# Patient Record
Sex: Female | Born: 1955 | Race: White | Hispanic: No | Marital: Married | Smoking: Never smoker
Health system: Southern US, Community
[De-identification: ages and names within clinical notes are randomized; demographics above are authoritative.]

## PROBLEM LIST (undated history)

## (undated) DIAGNOSIS — D369 Benign neoplasm, unspecified site: Secondary | ICD-10-CM

## (undated) DIAGNOSIS — M5090 Cervical disc disorder, unspecified, unspecified cervical region: Secondary | ICD-10-CM

## (undated) DIAGNOSIS — M1990 Unspecified osteoarthritis, unspecified site: Secondary | ICD-10-CM

## (undated) DIAGNOSIS — Z960 Presence of urogenital implants: Secondary | ICD-10-CM

## (undated) DIAGNOSIS — Z780 Asymptomatic menopausal state: Secondary | ICD-10-CM

## (undated) DIAGNOSIS — G709 Myoneural disorder, unspecified: Secondary | ICD-10-CM

## (undated) DIAGNOSIS — T148XXA Other injury of unspecified body region, initial encounter: Secondary | ICD-10-CM

## (undated) DIAGNOSIS — C801 Malignant (primary) neoplasm, unspecified: Secondary | ICD-10-CM

## (undated) DIAGNOSIS — M4802 Spinal stenosis, cervical region: Secondary | ICD-10-CM

## (undated) DIAGNOSIS — R591 Generalized enlarged lymph nodes: Secondary | ICD-10-CM

## (undated) HISTORY — DX: Cervical disc disorder, unspecified, unspecified cervical region: M50.90

## (undated) HISTORY — PX: ABDOMINAL SURGERY: SHX537

## (undated) HISTORY — DX: Malignant (primary) neoplasm, unspecified: C80.1

## (undated) HISTORY — DX: Spinal stenosis, cervical region: M48.02

## (undated) HISTORY — PX: ILEOSTOMY: SHX1783

## (undated) HISTORY — DX: Asymptomatic menopausal state: Z78.0

## (undated) HISTORY — PX: OTHER SURGICAL HISTORY: SHX169

## (undated) HISTORY — DX: Generalized enlarged lymph nodes: R59.1

## (undated) HISTORY — PX: BREAST BIOPSY: SHX20

## (undated) HISTORY — DX: Benign neoplasm, unspecified site: D36.9

## (undated) HISTORY — PX: LYMPH GLAND EXCISION: SHX13

## (undated) SURGERY — INSERTION, TUNNELED CENTRAL VENOUS DEVICE, WITH PORT
Anesthesia: General | Laterality: Left

---

## 1984-11-27 HISTORY — PX: EXPLORATORY LAPAROTOMY: SUR591

## 1984-11-27 HISTORY — PX: APPENDECTOMY: SHX54

## 1984-11-27 HISTORY — PX: MASS EXCISION: SHX2000

## 2007-03-07 ENCOUNTER — Ambulatory Visit: Payer: Self-pay | Admitting: Unknown Physician Specialty

## 2007-04-29 ENCOUNTER — Ambulatory Visit: Payer: Self-pay | Admitting: Unknown Physician Specialty

## 2007-04-29 ENCOUNTER — Other Ambulatory Visit: Payer: Self-pay

## 2007-05-02 ENCOUNTER — Ambulatory Visit: Payer: Self-pay | Admitting: Unknown Physician Specialty

## 2007-12-05 ENCOUNTER — Ambulatory Visit: Payer: Self-pay | Admitting: Internal Medicine

## 2007-12-10 ENCOUNTER — Ambulatory Visit: Payer: Self-pay | Admitting: Cardiology

## 2007-12-18 ENCOUNTER — Ambulatory Visit: Payer: Self-pay | Admitting: Internal Medicine

## 2007-12-25 ENCOUNTER — Ambulatory Visit: Payer: Self-pay | Admitting: Internal Medicine

## 2007-12-29 ENCOUNTER — Ambulatory Visit: Payer: Self-pay | Admitting: Oncology

## 2008-01-02 ENCOUNTER — Ambulatory Visit: Payer: Self-pay | Admitting: Internal Medicine

## 2008-01-03 ENCOUNTER — Ambulatory Visit: Payer: Self-pay | Admitting: Oncology

## 2008-01-09 ENCOUNTER — Ambulatory Visit: Payer: Self-pay | Admitting: Oncology

## 2008-01-26 ENCOUNTER — Ambulatory Visit: Payer: Self-pay | Admitting: Oncology

## 2008-02-20 ENCOUNTER — Ambulatory Visit: Payer: Self-pay | Admitting: Oncology

## 2008-02-26 ENCOUNTER — Ambulatory Visit: Payer: Self-pay | Admitting: Oncology

## 2008-02-27 ENCOUNTER — Ambulatory Visit: Payer: Self-pay | Admitting: Unknown Physician Specialty

## 2008-03-03 ENCOUNTER — Ambulatory Visit: Payer: Self-pay | Admitting: Unknown Physician Specialty

## 2008-03-04 ENCOUNTER — Ambulatory Visit: Payer: Self-pay | Admitting: Oncology

## 2008-03-10 ENCOUNTER — Ambulatory Visit: Payer: Self-pay | Admitting: Unknown Physician Specialty

## 2008-03-18 ENCOUNTER — Ambulatory Visit: Payer: Self-pay | Admitting: Unknown Physician Specialty

## 2008-03-25 ENCOUNTER — Ambulatory Visit: Payer: Self-pay | Admitting: Surgery

## 2008-03-27 HISTORY — PX: THORACOTOMY: SUR1349

## 2008-04-01 ENCOUNTER — Inpatient Hospital Stay: Payer: Self-pay | Admitting: Surgery

## 2008-04-14 ENCOUNTER — Ambulatory Visit: Payer: Self-pay | Admitting: Surgery

## 2008-05-11 ENCOUNTER — Ambulatory Visit: Payer: Self-pay | Admitting: Surgery

## 2009-11-27 DIAGNOSIS — Z780 Asymptomatic menopausal state: Secondary | ICD-10-CM

## 2009-11-27 HISTORY — DX: Asymptomatic menopausal state: Z78.0

## 2010-06-09 ENCOUNTER — Ambulatory Visit: Payer: Self-pay | Admitting: Internal Medicine

## 2010-10-18 ENCOUNTER — Encounter: Payer: Self-pay | Admitting: Family Medicine

## 2010-10-27 ENCOUNTER — Encounter: Payer: Self-pay | Admitting: Family Medicine

## 2010-10-31 ENCOUNTER — Ambulatory Visit: Payer: Self-pay | Admitting: Internal Medicine

## 2010-11-09 ENCOUNTER — Ambulatory Visit: Payer: Self-pay | Admitting: Pain Medicine

## 2010-11-27 ENCOUNTER — Encounter: Payer: Self-pay | Admitting: Family Medicine

## 2012-03-14 ENCOUNTER — Ambulatory Visit: Payer: Self-pay | Admitting: Obstetrics and Gynecology

## 2012-03-14 LAB — URINALYSIS, COMPLETE
Glucose,UR: NEGATIVE mg/dL (ref 0–75)
Ketone: NEGATIVE
Ph: 6 (ref 4.5–8.0)
RBC,UR: NONE SEEN /HPF (ref 0–5)
WBC UR: 1 /HPF (ref 0–5)

## 2012-03-14 LAB — CBC
HGB: 13.7 g/dL (ref 12.0–16.0)
MCH: 30.9 pg (ref 26.0–34.0)
MCHC: 34.1 g/dL (ref 32.0–36.0)
MCV: 91 fL (ref 80–100)
Platelet: 150 10*3/uL (ref 150–440)
RBC: 4.43 10*6/uL (ref 3.80–5.20)
RDW: 12.7 % (ref 11.5–14.5)

## 2012-03-14 LAB — PREGNANCY, URINE: Pregnancy Test, Urine: NEGATIVE m[IU]/mL

## 2012-03-21 ENCOUNTER — Ambulatory Visit: Payer: Self-pay | Admitting: Obstetrics and Gynecology

## 2013-07-16 ENCOUNTER — Ambulatory Visit: Payer: Self-pay | Admitting: Cardiology

## 2015-03-21 NOTE — Op Note (Signed)
PATIENT NAME:  Sylvia Jones, Sylvia Jones MR#:  [id number] DATE OF BIRTH:  09/27/1956  DATE OF PROCEDURE:  03/21/2012  PREOPERATIVE DIAGNOSES:   1. Postmenopausal bleeding. 2. AGUS Pap.   POSTOPERATIVE DIAGNOSES:  1. Postmenopausal bleeding. 2. AGUS Pap.   PROCEDURES: Hysteroscopy, dilation and curettage.   SURGEON: Omarri Eich A. Ferne Reus, MD  ANESTHESIA: General.   ESTIMATED BLOOD LOSS: 50 mL.  OPERATIVE FLUIDS: 600 mL.   URINE OUTPUT: 100 mL.   COMPLICATIONS: None.   FINDINGS: Normal appearing endometrium.   SPECIMEN: Endometrial curettings.   INDICATIONS: Patient is a 59 year old who presents with postmenopausal bleeding. She had a slightly thickened endometrium on ultrasound in office. Attempted endometrial biopsy was not successful secondary to patient's stenotic cervix therefore decision was made to proceed towards surgical evaluation and more extensive uterine sampling. Risks, benefits, and alternatives of the procedure were explained and informed consent was obtained.   DESCRIPTION OF PROCEDURE: Patient was taken to the Operating Room with IV fluids running. She was prepped and draped in the usual sterile fashion in [place]. Speculum was placed inside of the patient's vagina. The anterior lip of the cervix was grasped with single-tooth tenaculum. The uterus sounded to 11 cm and the cervix was dilated using Pratt dilators. The hysteroscope was placed. Findings were as previously stated. The scope was removed and the endometrium was curetted using a serrated edge banjo curette. Specimen was removed and handed off. The scope was then replaced. There were no findings of polyps, submucosal fibroids or other worrisome findings at the time of hysteroscopy. All instrumentation was removed from the patient's vagina. Sponge, instrument counts were correct x2. Tenaculum site on the cervix was made hemostatic using AstrinGyn. Patient was awakened from anesthesia and taken to recovery room in  stable condition.   ____________________________ Rolm Gala Ferne Reus, MD law:cms D: 03/21/2012 12:59:36 ET T: 03/21/2012 13:44:25 ET JOB#: [id number]  cc: Sherlynn Carbon A. Ferne Reus, MD, <Dictator> Rolm Gala WEAVER LEE MD ELECTRONICALLY SIGNED 03/25/2012 9:06

## 2015-07-12 ENCOUNTER — Encounter
Admission: RE | Disposition: A | Discharge status: Home / Self Care | Payer: Self-pay | Source: Ambulatory Visit | Attending: Unknown Physician Specialty

## 2015-07-12 ENCOUNTER — Ambulatory Visit: Payer: 59 | Admitting: Anesthesiology

## 2015-07-12 ENCOUNTER — Encounter: Payer: Self-pay | Admitting: *Deleted

## 2015-07-12 ENCOUNTER — Ambulatory Visit
Admission: RE | Admit: 2015-07-12 | Discharge: 2015-07-12 | Disposition: A | Discharge status: Home / Self Care | Payer: 59 | Source: Ambulatory Visit | Attending: Unknown Physician Specialty | Admitting: Unknown Physician Specialty

## 2015-07-12 DIAGNOSIS — Z1211 Encounter for screening for malignant neoplasm of colon: Secondary | ICD-10-CM | POA: Diagnosis present

## 2015-07-12 DIAGNOSIS — K640 First degree hemorrhoids: Secondary | ICD-10-CM | POA: Insufficient documentation

## 2015-07-12 DIAGNOSIS — D123 Benign neoplasm of transverse colon: Secondary | ICD-10-CM | POA: Diagnosis not present

## 2015-07-12 DIAGNOSIS — Z800 Family history of malignant neoplasm of digestive organs: Secondary | ICD-10-CM | POA: Insufficient documentation

## 2015-07-12 HISTORY — PX: COLONOSCOPY WITH PROPOFOL: SHX5780

## 2015-07-12 SURGERY — COLONOSCOPY WITH PROPOFOL
Anesthesia: General

## 2015-07-12 MED ORDER — PROPOFOL INFUSION 10 MG/ML OPTIME
INTRAVENOUS | Status: DC | PRN
  Administered 2015-07-12: 100 ug/kg/min via INTRAVENOUS

## 2015-07-12 MED ORDER — FENTANYL CITRATE (PF) 100 MCG/2ML IJ SOLN
INTRAMUSCULAR | Status: DC | PRN
  Administered 2015-07-12 (×2): 25 ug via INTRAVENOUS
  Administered 2015-07-12: 50 ug via INTRAVENOUS

## 2015-07-12 MED ORDER — PROPOFOL 10 MG/ML IV BOLUS
INTRAVENOUS | Status: DC | PRN
  Administered 2015-07-12: 80 mg via INTRAVENOUS
  Administered 2015-07-12: 20 mg via INTRAVENOUS

## 2015-07-12 MED ORDER — SODIUM CHLORIDE 0.9 % IV SOLN: INTRAVENOUS | Status: DC

## 2015-07-12 MED ORDER — GLYCOPYRROLATE 0.2 MG/ML IJ SOLN
INTRAMUSCULAR | Status: DC | PRN
  Administered 2015-07-12: 0.2 mg via INTRAVENOUS

## 2015-07-12 MED ORDER — MIDAZOLAM HCL 2 MG/2ML IJ SOLN
INTRAMUSCULAR | Status: DC | PRN
  Administered 2015-07-12: 2 mg via INTRAVENOUS

## 2015-07-12 MED ORDER — SODIUM CHLORIDE 0.9 % IV SOLN
INTRAVENOUS | Status: DC
  Administered 2015-07-12: 13:00:00 via INTRAVENOUS

## 2015-07-12 MED ORDER — PHENYLEPHRINE HCL 10 MG/ML IJ SOLN
INTRAMUSCULAR | Status: DC | PRN
  Administered 2015-07-12 (×2): 100 ug via INTRAVENOUS
  Administered 2015-07-12: 50 ug via INTRAVENOUS
  Administered 2015-07-12: 100 ug via INTRAVENOUS

## 2015-07-12 NOTE — Op Note (Signed)
[facility] Gastroenterology Patient Name: Sylvia Jones Procedure Date: 07/12/2015 11:43 AM MRN: [id number] Account #: [id number] Date of Birth: 1956-05-08 Admit Type: Outpatient Age: 59 Room: [facility] ENDO ROOM 1 Gender: Female Note Status: Finalized Procedure:         Colonoscopy Indications:       Screening in patient at increased risk: Family history of                     1st-degree relative with colorectal cancer Providers:         Manya Silvas, MD Referring MD:      Rusty Aus, MD (Referring MD) Medicines:         Propofol per Anesthesia Complications:     No immediate complications. Procedure:         Pre-Anesthesia Assessment:                    - After reviewing the risks and benefits, the patient was                     deemed in satisfactory condition to undergo the procedure.                    After obtaining informed consent, the colonoscope was                     passed under direct vision. Throughout the procedure, the                     patient's blood pressure, pulse, and oxygen saturations                     were monitored continuously. The Colonoscope was                     introduced through the anus and advanced to the the cecum,                     identified by appendiceal orifice and ileocecal valve. The                     colonoscopy was performed with difficulty due to                     significant looping and a tortuous colon. Successful                     completion of the procedure was aided by applying                     abdominal pressure. The patient tolerated the procedure                     well. The quality of the bowel preparation was good. Findings:      The colon was long and redundant      A 10 mm polyp was found in the distal transverse colon. The polyp was       sessile. The polyp was removed with a hot snare. Resection and retrieval       were complete. To prevent bleeding after the polypectomy, one  hemostatic       clip was successfully placed. There was no bleeding during, and at the       end, of the procedure.  Internal hemorrhoids were found during endoscopy. The hemorrhoids were       small, medium-sized and Grade I (internal hemorrhoids that do not       prolapse).      The exam was otherwise without abnormality. Impression:        - One 10 mm polyp in the distal transverse colon. Resected                     and retrieved. Clip was placed.                    - Internal hemorrhoids.                    - The examination was otherwise normal. Recommendation:    - Await pathology results. Manya Silvas, MD 07/12/2015 2:14:23 PM This report has been signed electronically. Number of Addenda: 0 Note Initiated On: 07/12/2015 11:43 AM Scope Withdrawal Time: 0 hours 35 minutes 52 seconds  Total Procedure Duration: 0 hours 44 minutes 4 seconds       [facility]

## 2015-07-12 NOTE — Transfer of Care (Signed)
Immediate Anesthesia Transfer of Care Note  Patient: Sylvia Jones  Procedure(s) Performed: Procedure(s): COLONOSCOPY WITH PROPOFOL (N/A)  Patient Location: PACU  Anesthesia Type:General  Level of Consciousness: sedated  Airway & Oxygen Therapy: Patient Spontanous Breathing and Patient connected to nasal cannula oxygen  Post-op Assessment: Report given to RN and Post -op Vital signs reviewed and stable  Post vital signs: Reviewed and stable  Last Vitals:  Filed Vitals:   07/12/15 1243  BP: 147/64  Pulse: 66  Temp: 37.3 C  Resp: 16    Complications: No apparent anesthesia complications

## 2015-07-12 NOTE — Anesthesia Preprocedure Evaluation (Signed)
Anesthesia Evaluation  Patient identified by MRN, date of birth, ID band Patient awake    Reviewed: Allergy & Precautions, NPO status , Patient's Chart, lab work & pertinent test results  History of Anesthesia Complications (+) history of anesthetic complications  Airway Mallampati: I       Dental no notable dental hx.    Pulmonary neg pulmonary ROS,    Pulmonary exam normal       Cardiovascular negative cardio ROS Normal cardiovascular exam    Neuro/Psych    GI/Hepatic negative GI ROS, Neg liver ROS,   Endo/Other  negative endocrine ROS  Renal/GU negative Renal ROS     Musculoskeletal negative musculoskeletal ROS (+)   Abdominal   Peds negative pediatric ROS (+)  Hematology negative hematology ROS (+)   Anesthesia Other Findings   Reproductive/Obstetrics negative OB ROS                             Anesthesia Physical Anesthesia Plan  ASA: I  Anesthesia Plan: General   Post-op Pain Management:    Induction: Intravenous  Airway Management Planned: Nasal Cannula  Additional Equipment:   Intra-op Plan:   Post-operative Plan:   Informed Consent: I have reviewed the patients History and Physical, chart, labs and discussed the procedure including the risks, benefits and alternatives for the proposed anesthesia with the patient or authorized representative who has indicated his/her understanding and acceptance.     Plan Discussed with: CRNA  Anesthesia Plan Comments:         Anesthesia Quick Evaluation

## 2015-07-12 NOTE — H&P (Signed)
   Primary Care Physician:  Rusty Aus., MD Primary Gastroenterologist:  Dr. Vira Agar  Pre-Procedure History & Physical: HPI:  Sylvia Jones is a 59 y.o. female is here for an colonoscopy.   No past medical history on file.  Past Surgical History  Procedure Laterality Date  . Thoracotomy Right     small nodule noted    Prior to Admission medications   Medication Sig Start Date End Date Taking? Authorizing Provider  Multiple Vitamin (MULTIVITAMIN) tablet Take 1 tablet by mouth daily.   Yes Historical Provider, MD  alendronate-cholecalciferol (FOSAMAX PLUS D) 70-2800 MG-UNIT per tablet Take 1 tablet by mouth every 7 (seven) days. Take with a full glass of water on an empty stomach.    Historical Provider, MD    Allergies as of 05/27/2015  . (Not on File)    No family history on file.  Social History   Social History  . Marital Status: Married    Spouse Name: N/A  . Number of Children: N/A  . Years of Education: N/A   Occupational History  . Not on file.   Social History Main Topics  . Smoking status: Not on file  . Smokeless tobacco: Not on file  . Alcohol Use: Not on file  . Drug Use: Not on file  . Sexual Activity: Not on file   Other Topics Concern  . Not on file   Social History Narrative  . No narrative on file    Review of Systems: See HPI, otherwise negative ROS  Physical Exam: BP 147/64 mmHg  Pulse 66  Temp(Src) 99.2 F (37.3 C) (Tympanic)  Resp 16  Ht 5\' 5"  (1.651 m)  Wt 58.968 kg (130 lb)  BMI 21.63 kg/m2  SpO2 100% General:   Alert,  pleasant and cooperative in NAD Head:  Normocephalic and atraumatic. Neck:  Supple; no masses or thyromegaly. Lungs:  Clear throughout to auscultation.    Heart:  Regular rate and rhythm. Abdomen:  Soft, nontender and nondistended. Normal bowel sounds, without guarding, and without rebound.   Neurologic:  Alert and  oriented x4;  grossly normal neurologically.  Impression/Plan: Sylvia Jones  is here for an colonoscopy to be performed for family hx colon cancer  Risks, benefits, limitations, and alternatives regarding  colonoscopy have been reviewed with the patient.  Questions have been answered.  All parties agreeable.   Gaylyn Cheers, MD  07/12/2015, 1:21 PM

## 2015-07-12 NOTE — Anesthesia Postprocedure Evaluation (Signed)
  Anesthesia Post-op Note  Patient: Sylvia Jones  Procedure(s) Performed: Procedure(s): COLONOSCOPY WITH PROPOFOL (N/A)  Anesthesia type:General  Patient location: PACU  Post pain: Pain level controlled  Post assessment: Post-op Vital signs reviewed, Patient's Cardiovascular Status Stable, Respiratory Function Stable, Patent Airway and No signs of Nausea or vomiting  Post vital signs: Reviewed and stable  Last Vitals:  Filed Vitals:   07/12/15 1420  BP: 103/64  Pulse: 75  Temp: 35.8 C  Resp: 11    Level of consciousness: awake, alert  and patient cooperative  Complications: No apparent anesthesia complications

## 2015-07-13 ENCOUNTER — Encounter: Payer: Self-pay | Admitting: Unknown Physician Specialty

## 2015-07-14 LAB — SURGICAL PATHOLOGY

## 2015-11-28 DIAGNOSIS — T148XXA Other injury of unspecified body region, initial encounter: Secondary | ICD-10-CM

## 2015-11-28 HISTORY — DX: Other injury of unspecified body region, initial encounter: T14.8XXA

## 2016-02-28 DIAGNOSIS — J343 Hypertrophy of nasal turbinates: Secondary | ICD-10-CM | POA: Diagnosis not present

## 2016-02-28 DIAGNOSIS — R0981 Nasal congestion: Secondary | ICD-10-CM | POA: Diagnosis not present

## 2016-02-28 DIAGNOSIS — J328 Other chronic sinusitis: Secondary | ICD-10-CM | POA: Diagnosis not present

## 2016-02-28 DIAGNOSIS — J3089 Other allergic rhinitis: Secondary | ICD-10-CM | POA: Diagnosis not present

## 2016-02-28 DIAGNOSIS — J342 Deviated nasal septum: Secondary | ICD-10-CM | POA: Diagnosis not present

## 2016-02-28 DIAGNOSIS — J3489 Other specified disorders of nose and nasal sinuses: Secondary | ICD-10-CM | POA: Diagnosis not present

## 2016-03-01 DIAGNOSIS — Z0000 Encounter for general adult medical examination without abnormal findings: Secondary | ICD-10-CM | POA: Diagnosis not present

## 2016-03-01 DIAGNOSIS — M205X1 Other deformities of toe(s) (acquired), right foot: Secondary | ICD-10-CM | POA: Diagnosis not present

## 2016-03-01 DIAGNOSIS — M7752 Other enthesopathy of left foot: Secondary | ICD-10-CM | POA: Diagnosis not present

## 2016-03-01 DIAGNOSIS — M79671 Pain in right foot: Secondary | ICD-10-CM | POA: Diagnosis not present

## 2016-03-01 DIAGNOSIS — Q6689 Other  specified congenital deformities of feet: Secondary | ICD-10-CM | POA: Diagnosis not present

## 2016-03-08 DIAGNOSIS — Z0000 Encounter for general adult medical examination without abnormal findings: Secondary | ICD-10-CM | POA: Diagnosis not present

## 2016-03-08 DIAGNOSIS — D369 Benign neoplasm, unspecified site: Secondary | ICD-10-CM | POA: Insufficient documentation

## 2016-03-08 DIAGNOSIS — M542 Cervicalgia: Secondary | ICD-10-CM | POA: Diagnosis not present

## 2016-03-08 DIAGNOSIS — M5090 Cervical disc disorder, unspecified, unspecified cervical region: Secondary | ICD-10-CM | POA: Insufficient documentation

## 2016-03-28 DIAGNOSIS — H353131 Nonexudative age-related macular degeneration, bilateral, early dry stage: Secondary | ICD-10-CM | POA: Diagnosis not present

## 2016-03-28 DIAGNOSIS — H02409 Unspecified ptosis of unspecified eyelid: Secondary | ICD-10-CM | POA: Diagnosis not present

## 2016-04-07 DIAGNOSIS — R0981 Nasal congestion: Secondary | ICD-10-CM | POA: Diagnosis not present

## 2016-04-07 DIAGNOSIS — R911 Solitary pulmonary nodule: Secondary | ICD-10-CM | POA: Diagnosis not present

## 2016-04-07 DIAGNOSIS — R040 Epistaxis: Secondary | ICD-10-CM | POA: Diagnosis not present

## 2016-04-07 DIAGNOSIS — J328 Other chronic sinusitis: Secondary | ICD-10-CM | POA: Diagnosis not present

## 2016-04-07 DIAGNOSIS — R930 Abnormal findings on diagnostic imaging of skull and head, not elsewhere classified: Secondary | ICD-10-CM | POA: Diagnosis not present

## 2016-04-07 DIAGNOSIS — L719 Rosacea, unspecified: Secondary | ICD-10-CM | POA: Diagnosis not present

## 2016-04-07 DIAGNOSIS — J3489 Other specified disorders of nose and nasal sinuses: Secondary | ICD-10-CM | POA: Diagnosis not present

## 2016-04-07 DIAGNOSIS — M950 Acquired deformity of nose: Secondary | ICD-10-CM | POA: Diagnosis not present

## 2016-04-07 DIAGNOSIS — J3089 Other allergic rhinitis: Secondary | ICD-10-CM | POA: Diagnosis not present

## 2016-04-07 DIAGNOSIS — J342 Deviated nasal septum: Secondary | ICD-10-CM | POA: Diagnosis not present

## 2016-04-07 DIAGNOSIS — J310 Chronic rhinitis: Secondary | ICD-10-CM | POA: Diagnosis not present

## 2016-04-18 DIAGNOSIS — Z01419 Encounter for gynecological examination (general) (routine) without abnormal findings: Secondary | ICD-10-CM | POA: Diagnosis not present

## 2016-04-18 DIAGNOSIS — Z800 Family history of malignant neoplasm of digestive organs: Secondary | ICD-10-CM | POA: Diagnosis not present

## 2016-04-18 DIAGNOSIS — N952 Postmenopausal atrophic vaginitis: Secondary | ICD-10-CM | POA: Diagnosis not present

## 2016-04-18 DIAGNOSIS — Z1239 Encounter for other screening for malignant neoplasm of breast: Secondary | ICD-10-CM | POA: Diagnosis not present

## 2016-04-25 ENCOUNTER — Other Ambulatory Visit: Payer: Self-pay | Admitting: Obstetrics and Gynecology

## 2016-04-25 DIAGNOSIS — Z1231 Encounter for screening mammogram for malignant neoplasm of breast: Secondary | ICD-10-CM

## 2016-04-28 ENCOUNTER — Ambulatory Visit
Admission: RE | Admit: 2016-04-28 | Discharge: 2016-04-28 | Disposition: A | Discharge status: Home / Self Care | Payer: 59 | Source: Ambulatory Visit | Attending: Obstetrics and Gynecology | Admitting: Obstetrics and Gynecology

## 2016-04-28 ENCOUNTER — Other Ambulatory Visit: Payer: Self-pay | Admitting: Obstetrics and Gynecology

## 2016-04-28 DIAGNOSIS — Z1231 Encounter for screening mammogram for malignant neoplasm of breast: Secondary | ICD-10-CM | POA: Insufficient documentation

## 2016-06-30 ENCOUNTER — Other Ambulatory Visit: Payer: Self-pay | Admitting: Orthopedic Surgery

## 2016-06-30 DIAGNOSIS — M25552 Pain in left hip: Secondary | ICD-10-CM | POA: Diagnosis not present

## 2016-06-30 DIAGNOSIS — S32502A Unspecified fracture of left pubis, initial encounter for closed fracture: Secondary | ICD-10-CM | POA: Diagnosis not present

## 2016-06-30 DIAGNOSIS — G8929 Other chronic pain: Secondary | ICD-10-CM | POA: Diagnosis not present

## 2016-07-01 ENCOUNTER — Ambulatory Visit
Admission: RE | Admit: 2016-07-01 | Discharge: 2016-07-01 | Disposition: A | Discharge status: Home / Self Care | Payer: 59 | Source: Ambulatory Visit | Attending: Orthopedic Surgery | Admitting: Orthopedic Surgery

## 2016-07-01 DIAGNOSIS — R609 Edema, unspecified: Secondary | ICD-10-CM | POA: Diagnosis not present

## 2016-07-01 DIAGNOSIS — G8929 Other chronic pain: Secondary | ICD-10-CM | POA: Insufficient documentation

## 2016-07-01 DIAGNOSIS — D7589 Other specified diseases of blood and blood-forming organs: Secondary | ICD-10-CM | POA: Insufficient documentation

## 2016-07-01 DIAGNOSIS — M25552 Pain in left hip: Secondary | ICD-10-CM | POA: Diagnosis not present

## 2016-07-17 DIAGNOSIS — M25552 Pain in left hip: Secondary | ICD-10-CM | POA: Diagnosis not present

## 2016-07-17 DIAGNOSIS — S32502D Unspecified fracture of left pubis, subsequent encounter for fracture with routine healing: Secondary | ICD-10-CM | POA: Diagnosis not present

## 2016-08-07 ENCOUNTER — Other Ambulatory Visit: Payer: Self-pay | Admitting: Internal Medicine

## 2016-08-07 ENCOUNTER — Ambulatory Visit
Admission: RE | Admit: 2016-08-07 | Discharge: 2016-08-07 | Disposition: A | Discharge status: Home / Self Care | Payer: 59 | Source: Ambulatory Visit | Attending: Internal Medicine | Admitting: Internal Medicine

## 2016-08-07 DIAGNOSIS — M80059A Age-related osteoporosis with current pathological fracture, unspecified femur, initial encounter for fracture: Secondary | ICD-10-CM | POA: Diagnosis not present

## 2016-08-07 DIAGNOSIS — R591 Generalized enlarged lymph nodes: Secondary | ICD-10-CM

## 2016-08-07 DIAGNOSIS — R590 Localized enlarged lymph nodes: Secondary | ICD-10-CM | POA: Insufficient documentation

## 2016-08-07 DIAGNOSIS — S32502D Unspecified fracture of left pubis, subsequent encounter for fracture with routine healing: Secondary | ICD-10-CM | POA: Diagnosis not present

## 2016-08-07 DIAGNOSIS — R600 Localized edema: Secondary | ICD-10-CM | POA: Diagnosis not present

## 2016-08-07 DIAGNOSIS — M899 Disorder of bone, unspecified: Secondary | ICD-10-CM | POA: Diagnosis not present

## 2016-08-07 DIAGNOSIS — R1013 Epigastric pain: Secondary | ICD-10-CM | POA: Diagnosis not present

## 2016-08-07 DIAGNOSIS — R102 Pelvic and perineal pain: Secondary | ICD-10-CM | POA: Diagnosis not present

## 2016-08-07 DIAGNOSIS — R1084 Generalized abdominal pain: Secondary | ICD-10-CM | POA: Diagnosis not present

## 2016-08-07 DIAGNOSIS — I7090 Unspecified atherosclerosis: Secondary | ICD-10-CM | POA: Diagnosis not present

## 2016-08-07 MED ORDER — IOPAMIDOL (ISOVUE-300) INJECTION 61%
80.0000 mL | Freq: Once | INTRAVENOUS | Status: AC | PRN
  Administered 2016-08-07: 80 mL via INTRAVENOUS

## 2016-08-09 ENCOUNTER — Other Ambulatory Visit: Payer: Self-pay | Admitting: Internal Medicine

## 2016-08-11 ENCOUNTER — Encounter: Payer: Self-pay | Admitting: Internal Medicine

## 2016-08-11 ENCOUNTER — Encounter: Payer: Self-pay | Admitting: Cardiothoracic Surgery

## 2016-08-11 ENCOUNTER — Telehealth: Payer: Self-pay | Admitting: Internal Medicine

## 2016-08-11 ENCOUNTER — Inpatient Hospital Stay: Payer: 59

## 2016-08-11 ENCOUNTER — Ambulatory Visit (INDEPENDENT_AMBULATORY_CARE_PROVIDER_SITE_OTHER): Payer: 59 | Admitting: Cardiothoracic Surgery

## 2016-08-11 ENCOUNTER — Encounter (INDEPENDENT_AMBULATORY_CARE_PROVIDER_SITE_OTHER): Payer: Self-pay

## 2016-08-11 ENCOUNTER — Inpatient Hospital Stay: Payer: 59 | Attending: Internal Medicine | Admitting: Internal Medicine

## 2016-08-11 ENCOUNTER — Other Ambulatory Visit: Payer: Self-pay

## 2016-08-11 ENCOUNTER — Ambulatory Visit
Admission: RE | Admit: 2016-08-11 | Discharge: 2016-08-11 | Disposition: A | Discharge status: Home / Self Care | Payer: 59 | Source: Ambulatory Visit | Attending: Internal Medicine | Admitting: Internal Medicine

## 2016-08-11 VITALS — BP 145/89 | HR 80 | Temp 97.0°F | Resp 20 | Wt 132.0 lb

## 2016-08-11 DIAGNOSIS — R1010 Upper abdominal pain, unspecified: Secondary | ICD-10-CM

## 2016-08-11 DIAGNOSIS — C801 Malignant (primary) neoplasm, unspecified: Secondary | ICD-10-CM | POA: Insufficient documentation

## 2016-08-11 DIAGNOSIS — C770 Secondary and unspecified malignant neoplasm of lymph nodes of head, face and neck: Secondary | ICD-10-CM | POA: Insufficient documentation

## 2016-08-11 DIAGNOSIS — Z79899 Other long term (current) drug therapy: Secondary | ICD-10-CM | POA: Insufficient documentation

## 2016-08-11 DIAGNOSIS — R591 Generalized enlarged lymph nodes: Secondary | ICD-10-CM | POA: Diagnosis not present

## 2016-08-11 DIAGNOSIS — M899 Disorder of bone, unspecified: Secondary | ICD-10-CM | POA: Insufficient documentation

## 2016-08-11 DIAGNOSIS — R634 Abnormal weight loss: Secondary | ICD-10-CM | POA: Insufficient documentation

## 2016-08-11 DIAGNOSIS — R110 Nausea: Secondary | ICD-10-CM | POA: Diagnosis not present

## 2016-08-11 DIAGNOSIS — I10 Essential (primary) hypertension: Secondary | ICD-10-CM | POA: Insufficient documentation

## 2016-08-11 DIAGNOSIS — K5909 Other constipation: Secondary | ICD-10-CM | POA: Insufficient documentation

## 2016-08-11 DIAGNOSIS — R590 Localized enlarged lymph nodes: Secondary | ICD-10-CM | POA: Insufficient documentation

## 2016-08-11 LAB — COMPREHENSIVE METABOLIC PANEL WITH GFR
ALT: 41 U/L (ref 14–54)
AST: 49 U/L — ABNORMAL HIGH (ref 15–41)
Albumin: 4.9 g/dL (ref 3.5–5.0)
Alkaline Phosphatase: 121 U/L (ref 38–126)
Anion gap: 14 (ref 5–15)
BUN: 12 mg/dL (ref 6–20)
CO2: 23 mmol/L (ref 22–32)
Calcium: 10.5 mg/dL — ABNORMAL HIGH (ref 8.9–10.3)
Chloride: 85 mmol/L — ABNORMAL LOW (ref 101–111)
Creatinine, Ser: 0.46 mg/dL (ref 0.44–1.00)
GFR calc Af Amer: >60 mL/min
GFR calc non Af Amer: >60 mL/min
Glucose, Bld: 83 mg/dL (ref 65–99)
Potassium: 3.2 mmol/L — ABNORMAL LOW (ref 3.5–5.1)
Sodium: 122 mmol/L — ABNORMAL LOW (ref 135–145)
Total Bilirubin: 1.5 mg/dL — ABNORMAL HIGH (ref 0.3–1.2)
Total Protein: 8.1 g/dL (ref 6.5–8.1)

## 2016-08-11 LAB — CBC WITH DIFFERENTIAL/PLATELET
BASOS ABS: 0 10*3/uL (ref 0–0.1)
BASOS PCT: 0 %
Eosinophils Absolute: 0.1 10*3/uL (ref 0–0.7)
Eosinophils Relative: 1 %
HEMATOCRIT: 40.8 % (ref 35.0–47.0)
Hemoglobin: 14.5 g/dL (ref 12.0–16.0)
LYMPHS PCT: 14 %
Lymphs Abs: 1 10*3/uL (ref 1.0–3.6)
MCH: 30.1 pg (ref 26.0–34.0)
MCHC: 35.5 g/dL (ref 32.0–36.0)
MCV: 84.8 fL (ref 80.0–100.0)
Monocytes Absolute: 0.7 10*3/uL (ref 0.2–0.9)
Monocytes Relative: 10 %
NEUTROS ABS: 5 10*3/uL (ref 1.4–6.5)
NEUTROS PCT: 75 %
Platelets: 241 10*3/uL (ref 150–440)
RBC: 4.81 MIL/uL (ref 3.80–5.20)
RDW: 12.2 % (ref 11.5–14.5)
WBC: 6.7 10*3/uL (ref 3.6–11.0)

## 2016-08-11 LAB — LACTATE DEHYDROGENASE: LDH: 260 U/L — AB (ref 98–192)

## 2016-08-11 LAB — PROTIME-INR
INR: 0.89
Prothrombin Time: 12 s (ref 11.4–15.2)

## 2016-08-11 LAB — APTT: aPTT: 29 s (ref 24–36)

## 2016-08-11 MED ORDER — IOPAMIDOL (ISOVUE-300) INJECTION 61%
75.0000 mL | Freq: Once | INTRAVENOUS | Status: AC | PRN
  Administered 2016-08-11: 75 mL via INTRAVENOUS

## 2016-08-11 NOTE — Progress Notes (Signed)
Pt reports Nausea yesterday.  CT scan done here at hospital last Monday.  X-rays performed at [facility] had blood clinic.

## 2016-08-11 NOTE — Progress Notes (Signed)
Patient ID: Sylvia Jones, female   DOB: September 04, 1956, 60 y.o.   MRN: [id number]  Chief Complaint  Patient presents with  . New Patient (Initial Visit)    Lymphadenopathy    Referred By  Dr. Fabienne Bruns Reason for Referral Left neck mass  HPI Location, Quality, Duration, Severity, Timing, Context, Modifying Factors, Associated Signs and Symptoms.  Sylvia Jones is a 60 y.o. female.  She was in her usual state of health until earlier this summer when she began experiencing what she thought was initially a pulled muscle in her left groin. She treated this symptomatically and over the course of several weeks the pain became more and more severe. She ultimately presented to her primary care physician where a MRI was performed revealing a pathologic fracture in the left pelvis.  She had a workup for myeloma including a urine protein electrophoresis which was unremarkable. She then was referred to Dr. Fabienne Bruns in oncology who obtained a CT scan of the chest and abdomen. This revealed retroperitoneal lymphadenopathy. During her evaluation she also complained of some tenderness in the left side of her neck and over the course of the last several weeks has noticed significant lymphadenopathy present. This is been mostly on the left side. When she was seen today in oncology she was scheduled to have a CT scan of the neck and I was asked to see the patient for consideration of lymph node biopsy. 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 The patient has had some symptoms of night sweats but no fever. She has been unable to walk long distances because of the pain in her pelvis. She currently uses a wheelchair for long distances.   History reviewed. No pertinent past medical history.  Past Surgical History:  Procedure Laterality Date  . BREAST BIOPSY Right    at age 17  . COLONOSCOPY WITH PROPOFOL  N/A 07/12/2015   Procedure: COLONOSCOPY WITH PROPOFOL;  Surgeon: Manya Silvas, MD;  Location: [facility] ENDOSCOPY;  Service: Endoscopy;  Laterality: N/A;  . THORACOTOMY Right    small nodule noted    Family History  Problem Relation Age of Onset  . Cancer Father   . Heart attack Father   . Hypertension Brother   . Diabetes Maternal Grandmother   . Diabetes Paternal Grandmother   . Cancer Brother     Social History Social History  Substance Use Topics  . Smoking status: Never Smoker  . Smokeless tobacco: Never Used  . Alcohol use No    Allergies  Allergen Reactions  . Sulfa Antibiotics     Current Outpatient Prescriptions  Medication Sig Dispense Refill  . alendronate-cholecalciferol (FOSAMAX PLUS D) 70-2800 MG-UNIT per tablet Take 1 tablet by mouth every 7 (seven) days. Take with a full glass of water on an empty stomach.    . Cholecalciferol 4000 units CAPS Take by mouth.    . cyclobenzaprine (FLEXERIL) 10 MG tablet Take 5 mg by mouth.    . docusate sodium (COLACE) 100 MG capsule Take 100 mg by mouth.    . losartan-hydrochlorothiazide (HYZAAR) 100-25 MG tablet Take by mouth.    . Multiple Vitamin (MULTIVITAMIN) tablet Take 1 tablet by mouth daily.    . naproxen sodium (ANAPROX) 220 MG tablet Take by mouth.    . traMADol (ULTRAM) 50 MG tablet Take by mouth.     No current facility-administered medications for this visit.  Review of Systems A complete review of systems was asked and was negative except for the following positive findings: night sweats, pelvic pain  Blood pressure (!) 145/89, pulse 80, temperature 97 F (36.1 C), temperature source Oral, resp. rate 20, height (P) 5\' 5"  (1.651 m), weight 59.9 kg (132 lb), SpO2 97 %.  Physical Exam CONSTITUTIONAL:  Pleasant, well-developed, well-nourished, and in no acute distress. EYES: Pupils equal and reactive to light, Sclera non-icteric EARS, NOSE, MOUTH AND THROAT:  The oropharynx was clear.  Dentition is  good repair.  Oral mucosa pink and moist. LYMPH NODES:  There were extensive bilateral lymphadenopathy present. There is a large palpable mass in the left neck. This measures about 3 cm. RESPIRATORY:  Lungs were clear.  Normal respiratory effort without pathologic use of accessory muscles of respiration CARDIOVASCULAR: Heart was regular without murmurs.  There were no carotid bruits. GI: The abdomen was soft, nontender, and nondistended. There were no palpable masses. There was no hepatosplenomegaly. There were normal bowel sounds in all quadrants. GU:  Rectal deferred.   MUSCULOSKELETAL:  Normal muscle strength and tone.  No clubbing or cyanosis.   SKIN:  There were no pathologic skin lesions.  There were no nodules on palpation. NEUROLOGIC:  Sensation is normal.  Cranial nerves are grossly intact. PSYCH:  Oriented to person, place and time.  Mood and affect are normal.  There is no palpable adenopathy in the groin or axillae.  Data Reviewed   I have personally reviewed the patient's imaging, laboratory findings and medical records.    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 The patient is scheduled to get a CT scan of the neck. I agree that an incisional or excisional biopsy of the left neck mass is indicated. We will schedule this for next week. I did ask the patient to hold her Naprosyn and to take tramadol or Tylenol for pain. She understands. I reviewed her laboratory studies which were obtained earlier this week. They are all within normal limits. She is scheduled to get additional laboratory studies made today. I reviewed with her the indications and risks of biopsy of this neck mass. She would like [place] to proceed.       Nestor Lewandowsky, MD 08/11/2016, 12:09 PM

## 2016-08-11 NOTE — Progress Notes (Signed)
[facility] NOTE  Patient Care Team: Rusty Aus, MD as PCP - General (Internal Medicine)  CHIEF COMPLAINTS/PURPOSE OF CONSULTATION:    # LYMPHADENOPATHY- extensive- abdominal/RP/ bil neck LN R>L.   # Lung RUL nodule [benign s/p resection; 2009- Dr.Oaks]   No history exists.     HISTORY OF PRESENTING ILLNESS:  Sylvia Jones 60 y.o.  female has been deferred was for further evaluation of lymphadenopathy.  Patient noted to have pain in her pelvic area the last many weeks. This led to an MRI- that showed  Possible left pubic rami fracture. Further evaluation with a CT of the chest abdomen pelvis showed- multiplelymph nodes in the abdomen [periportal/retroperitoneum/pelvis]; also left supraclavicular 5 mm; also several bony lesions noted on the CT scan. Patient has been deferred was for further evaluation/recommendation.  Patient noted to have a left-sided  Lump approximately 4-6 weeks ago. And more recently in the last few weeks noted to have similar lump on the right neck. About 5-6 pound weight loss.  Sinus head aches.. Last 2 days-Nasal drainage./post nasal drip  Chronic constipation. No tingling and numbness. Mild nausea no vomiting.  ROS: A complete 10 point review of system is done which is negative except mentioned above in history of present illness  MEDICAL HISTORY:  Past Medical History:  Diagnosis Date  . Cervical disc disease   . Hypertension   . Lymphadenopathy   . Tubular adenoma     SURGICAL HISTORY: Past Surgical History:  Procedure Laterality Date  . APPENDECTOMY  1986  . BREAST BIOPSY Right    at age 45  . CESAREAN SECTION  1988  . COLONOSCOPY WITH PROPOFOL N/A 07/12/2015   Procedure: COLONOSCOPY WITH PROPOFOL;  Surgeon: Manya Silvas, MD;  [facility] ENDOSCOPY;  Service: Endoscopy;  Laterality: N/A;  . EXPLORATORY LAPAROTOMY  1986  . THORACOTOMY Right 03/2008   RLL Wedge Resection- Dr. Genevive Bi    SOCIAL HISTORY:  lives in [place]; NO smoking/ alcohol- stay home/home schooled son; son-PA in [employer] [facility] Social History   Social History  . Marital status: Married    Spouse name: N/A  . Number of children: N/A  . Years of education: N/A   Occupational History  . Not on file.   Social History Main Topics  . Smoking status: Never Smoker  . Smokeless tobacco: Never Used  . Alcohol use No  . Drug use: No  . Sexual activity: Not on file   Other Topics Concern  . Not on file   Social History Narrative  . No narrative on file    FAMILY HISTORY: father- ? Cancer died 74. .. Brother- kasposi sarcoma Family History  Problem Relation Age of Onset  . Cancer Father   . Heart attack Father   . Hypertension Brother   . Diabetes Maternal Grandmother   . Diabetes Paternal Grandmother   . Cancer Brother     ALLERGIES:  is allergic to sulfa antibiotics.  MEDICATIONS:  Current Outpatient Prescriptions  Medication Sig Dispense Refill  . alendronate-cholecalciferol (FOSAMAX PLUS D) 70-2800 MG-UNIT per tablet Take 1 tablet by mouth every 7 (seven) days. Take with a full glass of water on an empty stomach.    . Cholecalciferol 4000 units CAPS Take by mouth.    . cyclobenzaprine (FLEXERIL) 10 MG tablet Take 5 mg by mouth.    . docusate sodium (COLACE) 100 MG capsule Take 100 mg by mouth.    . losartan-hydrochlorothiazide (HYZAAR) 100-25 MG tablet  Take by mouth.    . Multiple Vitamin (MULTIVITAMIN) tablet Take 1 tablet by mouth daily.    . naproxen sodium (ANAPROX) 220 MG tablet Take by mouth.    . traMADol (ULTRAM) 50 MG tablet Take by mouth.     No current facility-administered medications for this visit.       Marland Kitchen  PHYSICAL EXAMINATION: ECOG PERFORMANCE STATUS: 1 - Symptomatic but completely ambulatory  Vitals:   08/11/16 0859  BP: 122/75  Pulse: 98  Resp: 18  Temp: 97.4 F (36.3 C)   Filed Weights   08/11/16 0859  Weight: 131 lb 3.2 oz (59.5 kg)    GENERAL: Well-nourished  well-developed; Alert, no distress and comfortable.   With husband/son. Patient walking with pain EYES: no pallor or icterus OROPHARYNX: no thrush or ulceration; good dentition  NECK: supple, no masses felt LYMPH:  Matted lymphadenopathy noted in the left side of the neck; and also lymphadenopathy noted on the right neck posterior neck LUNGS: clear to auscultation and  No wheeze or crackles HEART/CVS: regular rate & rhythm and no murmurs; No lower extremity edema ABDOMEN: abdomen soft, non-tender and normal bowel sounds Musculoskeletal:no cyanosis of digits and no clubbing  PSYCH: alert & oriented x 3 with fluent speech NEURO: no focal motor/sensory deficits SKIN:  no rashes or significant lesions  LABORATORY DATA:  I have reviewed the data as listed Lab Results  Component Value Date   WBC 6.7 08/11/2016   HGB 14.5 08/11/2016   HCT 40.8 08/11/2016   MCV 84.8 08/11/2016   PLT 241 08/11/2016    Recent Labs  08/11/16 1030  NA 122*  K 3.2*  CL 85*  CO2 23  GLUCOSE 83  BUN 12  CREATININE 0.46  CALCIUM 10.5*  GFRNONAA >60  GFRAA >60  PROT 8.1  ALBUMIN 4.9  AST 49*  ALT 41  ALKPHOS 121  BILITOT 1.5*    RADIOGRAPHIC STUDIES: I have personally reviewed the radiological images as listed and agreed with the findings in the report. Ct Chest W Contrast  Result Date: 08/07/2016 : Please see accession number [id number] for the full report for the CT chest, abdomen, and pelvis. Electronically Signed   By: Van Clines M.D.   On: 08/07/2016 16:32   Ct Abdomen Pelvis W Contrast  Result Date: 08/07/2016 CLINICAL DATA:  Epigastric pain for 1 week. History of benign lung nodule. Lymphadenopathy found on physical exam. EXAM: CT CHEST, ABDOMEN, AND PELVIS WITH CONTRAST TECHNIQUE: Multidetector CT imaging of the chest, abdomen and pelvis was performed following the standard protocol during bolus administration of intravenous contrast. CONTRAST:  7mL ISOVUE-300 IOPAMIDOL  (ISOVUE-300) INJECTION 61% COMPARISON:  07/01/2016 MRI pelvis; nuclear medicine PET-CT from 02/20/2008 FINDINGS: CT CHEST FINDINGS Cardiovascular: Unremarkable Mediastinum/Nodes: Mild atherosclerotic calcification in the left subclavian artery wall. Small calcified subcarinal and right infrahilar lymph nodes compatible with old granulomatous disease. No pathologic axillary or subpectoral adenopathy. Small supraclavicular lymph nodes on the left are not pathologically enlarged, the largest is about 5 mm in diameter on image 4/2. Similarly there is no pathologic thoracic adenopathy. Lungs/Pleura: Biapical pleuroparenchymal scarring, not appreciably changed from 2009. Prior wedge resection in the right lower lobe at the site of the prior pulmonary nodule. There is a small calcified granuloma peripherally in the right lower lobe on image 94/4, measuring 3 mm in diameter. Minimal subsegmental atelectasis anteriorly in the left lower lobe. Trace left pleural effusion. Musculoskeletal: Thoracic spondylosis. Sclerosis posteriorly in the left first rib, new compared to  3/20 04/2008, significance uncertain. Focal lucency in the left anterior T2 vertebral body, 1 cm in diameter on image 22/4, not readily appreciable on 02/20/2008. CT ABDOMEN PELVIS FINDINGS Hepatobiliary: Unremarkable Pancreas: Unremarkable Spleen: Old granulomatous disease. Adrenals/Urinary Tract: Unremarkable Stomach/Bowel: Unremarkable Vascular/Lymphatic: There is gastrohepatic ligament, porta hepatis, mesenteric, retroperitoneal, and left pelvic adenopathy. Representative nodes include a 1.2 cm in short axis gastrohepatic lymph node on image 50/2; an indistinct 1.5 cm in short axis peripancreatic node on image 55/2; a left periaortic node measuring 1.2 cm on image 66/2; a left a common iliac node measuring 1.4 cm in short axis on image 80/2 ; a left external iliac node measuring 1.3 cm in short axis on image 96/2 ; and a mesenteric lymph node measuring 1.1  cm in short axis on image 85/2. The adenopathy in the retroperitoneum and mesentery is somewhat confluent. No significant atherosclerotic calcification. Reproductive: Unremarkable Other: Stranding/ edema in the mesentery and retroperitoneum. Presacral edema. Musculoskeletal: Sclerosis and some lucency in the left pubic body suspicious for a vertically oriented fracture. The lack of fracture elsewhere in the bony pelvis is a unusual feature. There is potentially a destructive lesion in the left superior pubic ramus Jason to the fracture, for example on image 40/5. IMPRESSION: 1. Pathologic adenopathy in the abdomen and pelvis suspicious for malignancy, potentially lymphoma. Tissue diagnosis recommended. 2. There several bony lesions suspicious for osseous metastatic disease including a sclerotic lesion in the left first rib medially ; a lucent lesion in the left side of the T2 vertebral body; and abnormal lucency in the left superior pubic ramus adjacent to a site of fracture, raising concern for a pathologic fracture. Other imaging findings of potential clinical significance: Minimal atherosclerosis in the chest. Prior wedge resection in the right lower lobe. Old granulomatous disease. Presacral edema. Electronically Signed   By: Van Clines M.D.   On: 08/07/2016 15:16    ASSESSMENT & PLAN:   Lymphadenopathy, generalized Pathologic adenopathy noted- the abdomen and pelvis; and also bilateral significant neck adenopathy left more than right. Also bony lesions left pubic rami/T2 thoracic vertebrae.  Highly suspicious for malignancy; especially lymphoma.   #Recommend CBC CMP and LDH; hepatitis B sAG; hepatitis core antibody.  # discussed with the patient's family that this will need a biopsy ASAP. Spoke with Dr. Faith Rogue and he agrees to evaluate the patient this morning.  # get CT of the neck ASAP. Also get a PET once biopsies confirmed.   # discussed with the patient/family in detail re: plan.   They'll follow with me in approximately 10 days or so; based upon above workup.   Thank you Dr. Sabra Heck for allowing me to participate in the care of your pleasant patient. Please do not hesitate to contact me with questions or concerns in the interim.   I reviewed the images myself and with the patient and family in detail. A copy of this report was given.   All questions were answered. The patient knows to call the clinic with any problems, questions or concerns.       Cammie Sickle, MD 08/11/2016 1:01 PM

## 2016-08-11 NOTE — Patient Instructions (Signed)
We will schedule your Lymph Node Biopsy for 08/15/16 by Dr. Genevive Bi at [facility]. Please see your [facility]) Pre-care sheet for more information.  Please call our office with any questions or concerns that you may have.

## 2016-08-11 NOTE — Telephone Encounter (Signed)
Spoke to patient regarding elevated calcium/low sodium. Recommend stopping blood pressure medica/calcium and vitamin D.  Check blood pressure at home if consistently elevated ca for different antihypertensive. Reviewed the results of the CT scan  # Heather/brandy- schedule & ORDER- BMP/PT PTT- 09/18-Monday.

## 2016-08-11 NOTE — Assessment & Plan Note (Addendum)
Pathologic adenopathy noted- the abdomen and pelvis; and also bilateral significant neck adenopathy left more than right. Also bony lesions left pubic rami/T2 thoracic vertebrae.  Highly suspicious for malignancy; especially lymphoma.   #Recommend CBC CMP and LDH; hepatitis B sAG; hepatitis core antibody.  # discussed with the patient's family that this will need a biopsy ASAP. Spoke with Dr. Faith Rogue and he agrees to evaluate the patient this morning.  # get CT of the neck ASAP. Also get a PET once biopsies confirmed.   # discussed with the patient/family in detail re: plan.  They'll follow with me in approximately 10 days or so; based upon above workup.   Thank you Dr. Sabra Heck for allowing me to participate in the care of your pleasant patient. Please do not hesitate to contact me with questions or concerns in the interim.

## 2016-08-12 LAB — HEPATITIS B SURFACE ANTIGEN: Hepatitis B Surface Ag: NEGATIVE

## 2016-08-12 LAB — HEPATITIS B CORE ANTIBODY, TOTAL: Hep B Core Total Ab: NEGATIVE

## 2016-08-13 ENCOUNTER — Emergency Department
Admission: EM | Admit: 2016-08-13 | Discharge: 2016-08-13 | Disposition: A | Discharge status: Home / Self Care | Payer: 59 | Attending: Emergency Medicine | Admitting: Emergency Medicine

## 2016-08-13 ENCOUNTER — Emergency Department: Payer: 59

## 2016-08-13 DIAGNOSIS — M62838 Other muscle spasm: Secondary | ICD-10-CM | POA: Diagnosis not present

## 2016-08-13 DIAGNOSIS — I10 Essential (primary) hypertension: Secondary | ICD-10-CM | POA: Diagnosis not present

## 2016-08-13 DIAGNOSIS — Z791 Long term (current) use of non-steroidal anti-inflammatories (NSAID): Secondary | ICD-10-CM | POA: Insufficient documentation

## 2016-08-13 DIAGNOSIS — M542 Cervicalgia: Secondary | ICD-10-CM | POA: Diagnosis present

## 2016-08-13 DIAGNOSIS — C419 Malignant neoplasm of bone and articular cartilage, unspecified: Secondary | ICD-10-CM | POA: Diagnosis not present

## 2016-08-13 DIAGNOSIS — M62830 Muscle spasm of back: Secondary | ICD-10-CM | POA: Diagnosis not present

## 2016-08-13 DIAGNOSIS — C7951 Secondary malignant neoplasm of bone: Secondary | ICD-10-CM

## 2016-08-13 DIAGNOSIS — Z79899 Other long term (current) drug therapy: Secondary | ICD-10-CM | POA: Diagnosis not present

## 2016-08-13 LAB — BASIC METABOLIC PANEL
ANION GAP: 7 (ref 5–15)
BUN: 7 mg/dL (ref 6–20)
CALCIUM: 9.7 mg/dL (ref 8.9–10.3)
CO2: 33 mmol/L — ABNORMAL HIGH (ref 22–32)
Chloride: 96 mmol/L — ABNORMAL LOW (ref 101–111)
Creatinine, Ser: 0.45 mg/dL (ref 0.44–1.00)
GLUCOSE: 112 mg/dL — AB (ref 65–99)
POTASSIUM: 3.4 mmol/L — AB (ref 3.5–5.1)
SODIUM: 136 mmol/L (ref 135–145)

## 2016-08-13 LAB — CBC WITH DIFFERENTIAL/PLATELET
BASOS ABS: 0 10*3/uL (ref 0–0.1)
BASOS PCT: 1 %
Eosinophils Absolute: 0.1 10*3/uL (ref 0–0.7)
Eosinophils Relative: 3 %
HEMATOCRIT: 41.9 % (ref 35.0–47.0)
HEMOGLOBIN: 14.8 g/dL (ref 12.0–16.0)
LYMPHS PCT: 21 %
Lymphs Abs: 1 10*3/uL (ref 1.0–3.6)
MCH: 30.2 pg (ref 26.0–34.0)
MCHC: 35.2 g/dL (ref 32.0–36.0)
MCV: 85.6 fL (ref 80.0–100.0)
Monocytes Absolute: 0.6 10*3/uL (ref 0.2–0.9)
Monocytes Relative: 12 %
NEUTROS ABS: 3.1 10*3/uL (ref 1.4–6.5)
NEUTROS PCT: 63 %
Platelets: 250 10*3/uL (ref 150–440)
RBC: 4.89 MIL/uL (ref 3.80–5.20)
RDW: 12.6 % (ref 11.5–14.5)
WBC: 4.9 10*3/uL (ref 3.6–11.0)

## 2016-08-13 MED ORDER — HYDROCODONE-ACETAMINOPHEN 5-325 MG PO TABS
1.0000 | ORAL_TABLET | Freq: Four times a day (QID) | ORAL | 0 refills | Status: AC | PRN
Start: 2016-08-13 — End: 2016-08-16

## 2016-08-13 MED ORDER — DIAZEPAM 2 MG PO TABS: 2.0000 mg | ORAL_TABLET | Freq: Three times a day (TID) | ORAL | 0 refills | Status: DC | PRN

## 2016-08-13 MED ORDER — DIAZEPAM 5 MG/ML IJ SOLN
2.5000 mg | Freq: Once | INTRAMUSCULAR | Status: AC
  Administered 2016-08-13: 2.5 mg via INTRAVENOUS
  Filled 2016-08-13: qty 2

## 2016-08-13 MED ORDER — HYDROCODONE-ACETAMINOPHEN 5-325 MG PO TABS: 1.0000 | ORAL_TABLET | Freq: Four times a day (QID) | ORAL | 0 refills | Status: DC | PRN

## 2016-08-13 MED ORDER — HYDROCODONE-ACETAMINOPHEN 5-325 MG PO TABS
1.0000 | ORAL_TABLET | Freq: Once | ORAL | Status: AC
  Administered 2016-08-13: 1 via ORAL
  Filled 2016-08-13: qty 1

## 2016-08-13 NOTE — ED Triage Notes (Signed)
Pt reports she lifted something and heard a popping sound in the neck. States since Monday concerns for lymphoma and bone mets but is to have definitive surgery on Tuesday.

## 2016-08-13 NOTE — ED Provider Notes (Signed)
[facility] Emergency Department Provider Note  ____________________________________________  Time seen: Approximately 1:44 PM  I have reviewed the triage vital signs and the nursing notes.   HISTORY  Chief Complaint Neck Pain    HPI Sylvia Jones is a 60 y.o. female , NAD, presents to the emergency department accompanied by her husband who assists with history. States she had sudden, sharp onset of upper back and neck pain last night. States she was sitting on her couch, reached for a basket on the floor and had a sudden sharp pain in her upper back. States that the pain has been consistent since that time. Can have a significant increase in pain if she has any weight and the left hand and tries to raise her left arm. States she is able to raise the left arm without pain if she is not holding anything. Pain subsides at rest and worsens with certain movements of the upper body and neck. Attempted to take a muscle relaxer today which helped her rest but she woke from sleep with another sharp pain. Her husband who is at the bedside states that through the night and this morning while she was resting she seemed to be restless and in pain. States that she was given Ultram for pain due to a pathologic fracture in her hip a few weeks ago in that usually helps with her pain. She did not take any Ultram since the onset of this new pain as she did not want to mask it prior to being assessed. 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 She does have close follow-up with oncology in which she had a CT scan completed 2 days ago and has an appointment on Tuesday for lymph node resection and investigation. She does note that she is advised to avoid any aspirin or aspirin products as she does have surgery scheduled for Tuesday.   Past Medical History:  Diagnosis Date  . Cervical disc disease   . Hypertension   . Lymphadenopathy    . Tubular adenoma     Patient Active Problem List   Diagnosis Date Noted  . Lymphadenopathy, generalized 08/11/2016  . Cervical disc disease 03/08/2016  . Tubular adenoma 03/08/2016    Past Surgical History:  Procedure Laterality Date  . APPENDECTOMY  1986  . BREAST BIOPSY Right    at age 49  . CESAREAN SECTION  1988  . COLONOSCOPY WITH PROPOFOL N/A 07/12/2015   Procedure: COLONOSCOPY WITH PROPOFOL;  Surgeon: Manya Silvas, MD;  Location: [facility] ENDOSCOPY;  Service: Endoscopy;  Laterality: N/A;  . EXPLORATORY LAPAROTOMY  1986  . THORACOTOMY Right 03/2008   RLL Wedge Resection- Dr. Genevive Bi    Prior to Admission medications   Medication Sig Start Date End Date Taking? Authorizing Provider  alendronate-cholecalciferol (FOSAMAX PLUS D) 70-2800 MG-UNIT per tablet Take 1 tablet by mouth every 7 (seven) days. Take with a full glass of water on an empty stomach.    Historical Provider, MD  Cholecalciferol 4000 units CAPS Take by mouth.    Historical Provider, MD  cyclobenzaprine (FLEXERIL) 10 MG tablet Take 5 mg by mouth. 03/08/16   Historical Provider, MD  diazepam (VALIUM) 2 MG tablet Take 1 tablet (2 mg total) by mouth every 8 (eight) hours as needed for anxiety. 08/13/16 08/13/17  Via Rosado L Aimy Sweeting, PA-C  docusate sodium (COLACE) 100 MG capsule Take 100 mg by mouth.    Historical Provider, MD  HYDROcodone-acetaminophen [facility])  5-325 MG tablet Take 1-2 tablets by mouth every 6 (six) hours as needed for severe pain. 08/13/16 08/16/16  Jeri Jeanbaptiste L Rovena Hearld, PA-C  losartan-hydrochlorothiazide (HYZAAR) 100-25 MG tablet Take by mouth. 08/08/16 08/08/17  Historical Provider, MD  Multiple Vitamin (MULTIVITAMIN) tablet Take 1 tablet by mouth daily.    Historical Provider, MD  naproxen sodium (ANAPROX) 220 MG tablet Take by mouth.    Historical Provider, MD  traMADol (ULTRAM) 50 MG tablet Take by mouth. 06/30/16   Historical Provider, MD    Allergies Sulfa antibiotics  Family History  Problem Relation Age of  Onset  . Cancer Father   . Heart attack Father   . Hypertension Brother   . Diabetes Maternal Grandmother   . Diabetes Paternal Grandmother   . Cancer Brother     Social History Social History  Substance Use Topics  . Smoking status: Never Smoker  . Smokeless tobacco: Never Used  . Alcohol use No     Review of Systems  Constitutional: No fever/chills Cardiovascular: No chest pain. Respiratory: No shortness of breath. No wheezing.  Gastrointestinal: No abdominal pain.  No nausea, vomiting.   Musculoskeletal: Positive for neck and upper back pain.  Skin: Negative for rash, redness, swelling, bruising, skin sores. Neurological: Negative for numbness, weakness, tingling. 10-point ROS otherwise negative.  ____________________________________________   PHYSICAL EXAM:  VITAL SIGNS: ED Triage Vitals  Enc Vitals Group     BP 08/13/16 1319 (!) 146/84     Pulse Rate 08/13/16 1319 96     Resp 08/13/16 1319 20     Temp 08/13/16 1319 98 F (36.7 C)     Temp Source 08/13/16 1319 Oral     SpO2 08/13/16 1319 98 %     Weight 08/13/16 1324 131 lb (59.4 kg)     Height 08/13/16 1324 5\' 5"  (1.651 m)     Head Circumference --      Peak Flow --      Pain Score 08/13/16 1324 5     Pain Loc --      Pain Edu? --      Excl. in [place]? --      Constitutional: Alert and oriented. Fatigued appearing and in no acute distress but in pain. Eyes: Conjunctivae are normal. PERRL. EOMI without pain.  Head: Atraumatic. Neck: No cervical spine nor paraspinal tenderness to palpation.Supple with good range of motion although patient is hesitant to complete full range of motion and stress due to pain and upper back. No splinting or stiffness. Hematological/Lymphatic/Immunilogical: No cervical lymphadenopathy. Cardiovascular:  Good peripheral circulation. Respiratory: Normal respiratory effort without tachypnea or retractions.  Musculoskeletal: Tenderness to palpation about the midline and paraspinal  thoracic region about the T2-T4 region with mild right paraspinal muscle spasm. Full range of motion of bilateral upper and lower extremities without pain or difficulty. Neurologic:  Normal speech and language. No gross focal neurologic deficits are appreciated. Sensation to light touch grossly intact, bilateral upper extremities. Grip strength is equal and 5 out of 5 in bilateral upper extremities including his hands, forearms and shoulders. Skin:  Skin is warm, dry and intact. No rash noted. Psychiatric: Mood and affect are normal. Speech and behavior are normal. Patient exhibits appropriate insight and judgement.   ____________________________________________   LABS (all labs ordered are listed, but only abnormal results are displayed)  Labs Reviewed  BASIC METABOLIC PANEL - Abnormal; Notable for the following:       Result Value   Potassium 3.4 (*)  Chloride 96 (*)    CO2 33 (*)    Glucose, Bld 112 (*)    All other components within normal limits  CBC WITH DIFFERENTIAL/PLATELET   ____________________________________________  EKG  None ____________________________________________  RADIOLOGY I have personally viewed and evaluated these images (plain radiographs) as part of my medical decision making, as well as reviewing the written report by the radiologist.  Dg Cervical Spine Complete  Result Date: 08/13/2016 CLINICAL DATA:  Patient with known pelvic fracture. Osseous metastasis. Upper back pain after bending over. EXAM: CERVICAL SPINE - COMPLETE 4+ VIEW COMPARISON:  CT CAP 08/07/2016; cervical spine MRI 10/31/2010 FINDINGS: Normal anatomic alignment. No evidence for acute fracture or dislocation. Lateral masses articulate appropriately with the dens. Lung apices are clear. Prevertebral soft tissues are unremarkable. Osseous sclerosis involving the medial first rib posteriorly. IMPRESSION: No acute osseous abnormality. Osseous sclerosis involving the posterior medial left first  rib concerning for metastasis. Electronically Signed   By: Lovey Newcomer M.D.   On: 08/13/2016 14:54   Dg Thoracic Spine 2 View  Result Date: 08/13/2016 CLINICAL DATA:  Patient with pain in the upper thoracic spine after bending over. Initial encounter. History of osseous metastasis. EXAM: THORACIC SPINE 2 VIEWS COMPARISON:  CT CAP 08/07/2016 FINDINGS: Normal anatomic alignment. No evidence for acute fracture dislocation. Re- demonstrated oval lucency within the anterior aspect of the T2 vertebral body, most compatible with osseous metastasis. Additionally there is patchy sclerosis involving the posterior medial left first rib. IMPRESSION: No acute osseous abnormality. 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 Electronically Signed   By: Lovey Newcomer M.D.   On: 08/13/2016 14:59    ____________________________________________    PROCEDURES  Procedure(s) performed: None   Procedures   Medications  diazepam (VALIUM) injection 2.5 mg (2.5 mg Intravenous Given 08/13/16 1411)  HYDROcodone-acetaminophen (NORCO/VICODIN) 5-325 MG per tablet 1 tablet (1 tablet Oral Given 08/13/16 1402)     ____________________________________________   INITIAL IMPRESSION / ASSESSMENT AND PLAN / ED COURSE  Pertinent labs & imaging results that were available during my care of the patient were reviewed by me and considered in my medical decision making (see chart for details).  Clinical Course  Value Comment By Time   Patient notes her pain level at rest is now a 2-3 and improved since being given diazepam and norco. Waiting on xray over read at this time.  Braxton Feathers, PA-C 09/17 1501   I spoke with the patient and her husband at the bedside in regards to her laboratory and imaging results which are all reassuring without any new findings. Electrolytes have improved as compared to metabolic panel completed 2 days ago. Notes that she did eat breakfast this morning and  has been drinking Gatorade today which will account for slightly elevated glucose. Patient continues to note that her pain has improved and no new symptoms have arisen since being in the emergency department. She her husband are in agreement with the plan to defer any further imaging to her orthopedic and oncology specialist. I feel at this time that her pain is related to muscle spasm and bony metastases around T2 vertebra. We will give her prescription for Valium and Norco that she can take for severe pain. Continue to rest and follow previous instructions as given by orthopedics and oncology. Braxton Feathers, PA-C 09/17 1516  CO2: (!) 33 (Reviewed) Lavonia Drafts, MD 09/17 1525    Patient's diagnosis is consistent with Muscle spasm  of back and bony metastasis. Patient will be discharged home with prescriptions for Valium and Norco to take as directed. Patient is to follow up with her orthopedic and/or oncologist as currently scheduled or as needed if symptoms persist past this treatment course. Patient is given ED precautions to return to the ED for any worsening or new symptoms.    ____________________________________________  FINAL CLINICAL IMPRESSION(S) / ED DIAGNOSES  Final diagnoses:  Muscle spasm of back  Bony metastasis (HCC)      NEW MEDICATIONS STARTED DURING THIS VISIT:  Discharge Medication List as of 08/13/2016  3:23 PM    START taking these medications   Details  diazepam (VALIUM) 2 MG tablet Take 1 tablet (2 mg total) by mouth every 8 (eight) hours as needed for anxiety., Starting Sun 08/13/2016, Until Mon 08/13/2017, Print    HYDROcodone-acetaminophen (NORCO) 5-325 MG tablet Take 1-2 tablets by mouth every 6 (six) hours as needed for severe pain., Starting Sun 08/13/2016, Until Wed 08/16/2016, Dwight Mission, PA-C 08/13/16 1544    Carrie Mew, MD 08/15/16 2230

## 2016-08-14 ENCOUNTER — Telehealth: Payer: Self-pay | Admitting: Cardiothoracic Surgery

## 2016-08-14 ENCOUNTER — Encounter: Payer: Self-pay | Admitting: Internal Medicine

## 2016-08-14 ENCOUNTER — Inpatient Hospital Stay: Admission: RE | Admit: 2016-08-14 | Payer: 59 | Source: Ambulatory Visit

## 2016-08-14 NOTE — Telephone Encounter (Signed)
Per Dr Genevive Bi, he would like to cancel the surgery that was scheduled for 08/15/16.  He has contacted the patient and advised her of this information.   I have contacted the [facility] and advised them of the cancellation.

## 2016-08-14 NOTE — Telephone Encounter (Signed)
Pt advised of pre op date/time and sx date. Sx: 08/15/16 with Dr Genevive Bi with Dr Pat Patrick assisting--Left lymph node bx of neck mass.  Pre op: 08/14/16 between 1-5:00pm--phone.   Patient made aware to call [phone], between 1-3:00pm the day before surgery, to find out what time to arrive.

## 2016-08-14 NOTE — Telephone Encounter (Signed)
Patient has called and stated that she was advised by the nurse that Lymph Node Biopsy for 08/15/16 by Dr. Genevive Bi at [facility] would be scheduled.   I have not received a message in regards to this or CC'ed chart.   Should this be scheduled tomorrrow? Please advise.

## 2016-08-14 NOTE — Telephone Encounter (Signed)
Spoke with md. Pt seen in ed yesterday. Will hold off on drawing any labs at this time.

## 2016-08-15 ENCOUNTER — Ambulatory Visit: Admit: 2016-08-15 | Payer: 59 | Admitting: Cardiothoracic Surgery

## 2016-08-15 ENCOUNTER — Telehealth: Payer: Self-pay | Admitting: Internal Medicine

## 2016-08-15 ENCOUNTER — Other Ambulatory Visit: Payer: Self-pay | Admitting: Internal Medicine

## 2016-08-15 DIAGNOSIS — R591 Generalized enlarged lymph nodes: Secondary | ICD-10-CM

## 2016-08-15 SURGERY — LYMPH NODE BIOPSY
Anesthesia: Choice | Laterality: Left

## 2016-08-15 NOTE — Telephone Encounter (Signed)
Spoke to radiology Dr.Green; recommend posterior cervical or supra-clav- ultrasound-guided biopsy.  # Spoke to the patient recommend biopsy she agrees. Continue to hold of calcium and vitamin D and blood pressure medication.  Dr.B

## 2016-08-15 NOTE — Telephone Encounter (Signed)
Faxed communication sheet to spec. sch. msg sent to cancer ctr sch. To arrange.

## 2016-08-17 ENCOUNTER — Telehealth: Payer: Self-pay | Admitting: *Deleted

## 2016-08-17 ENCOUNTER — Encounter: Payer: Self-pay | Admitting: Internal Medicine

## 2016-08-18 ENCOUNTER — Telehealth: Payer: Self-pay

## 2016-08-18 NOTE — Telephone Encounter (Signed)
Called ENT office and asked if patient was called to be seen soon. They stated that patient was contacted and was told to be seen on 08/15/2016 and patient did not show up to her appointment. They stated that they sent a letter for patient to call and reschedule her appointment. This information was given to Dr. Genevive Bi.

## 2016-08-18 NOTE — Telephone Encounter (Signed)
-----   Message from Mickie Kay sent at 08/14/2016  1:57 PM EDT ----- Regarding: RE: Referral to ENT I have faxed a referral with clinic notes, demo, imaging and labs to [phone]. ----- Message ----- From: Wayna Chalet, CMA Sent: 08/14/2016   1:41 PM To: Albin Felling Brouillard Subject: Referral to ENT                                Can you please send a referral to [facility] (Dr. Tami Ribas) in reference to this patient. She has Lymphadenopathy. Dr. Genevive Bi wants her to be seen by Dr. Tami Ribas.  They want [place] to fax them our office notes to [phone]. Thank You!

## 2016-08-21 ENCOUNTER — Other Ambulatory Visit: Payer: Self-pay | Admitting: Radiology

## 2016-08-21 ENCOUNTER — Inpatient Hospital Stay: Payer: 59 | Admitting: Internal Medicine

## 2016-08-22 ENCOUNTER — Ambulatory Visit
Admission: RE | Admit: 2016-08-22 | Discharge: 2016-08-22 | Disposition: A | Discharge status: Home / Self Care | Payer: 59 | Source: Ambulatory Visit | Attending: Internal Medicine | Admitting: Internal Medicine

## 2016-08-22 DIAGNOSIS — C96Z Other specified malignant neoplasms of lymphoid, hematopoietic and related tissue: Secondary | ICD-10-CM | POA: Diagnosis not present

## 2016-08-22 DIAGNOSIS — C801 Malignant (primary) neoplasm, unspecified: Secondary | ICD-10-CM | POA: Diagnosis not present

## 2016-08-22 DIAGNOSIS — R591 Generalized enlarged lymph nodes: Secondary | ICD-10-CM | POA: Insufficient documentation

## 2016-08-22 DIAGNOSIS — R599 Enlarged lymph nodes, unspecified: Secondary | ICD-10-CM | POA: Diagnosis not present

## 2016-08-22 DIAGNOSIS — R590 Localized enlarged lymph nodes: Secondary | ICD-10-CM | POA: Diagnosis not present

## 2016-08-22 HISTORY — DX: Other injury of unspecified body region, initial encounter: T14.8XXA

## 2016-08-22 HISTORY — DX: Unspecified osteoarthritis, unspecified site: M19.90

## 2016-08-22 MED ORDER — HYDROCODONE-ACETAMINOPHEN 5-325 MG PO TABS: 1.0000 | ORAL_TABLET | ORAL | Status: DC | PRN

## 2016-08-22 MED ORDER — FENTANYL CITRATE (PF) 100 MCG/2ML IJ SOLN
INTRAMUSCULAR | Status: AC | PRN
  Administered 2016-08-22: 50 ug via INTRAVENOUS

## 2016-08-22 MED ORDER — SODIUM CHLORIDE 0.9 % IV SOLN
Freq: Once | INTRAVENOUS | Status: AC
  Administered 2016-08-22: 08:00:00 via INTRAVENOUS

## 2016-08-22 MED ORDER — MIDAZOLAM HCL 5 MG/5ML IJ SOLN
INTRAMUSCULAR | Status: AC | PRN
  Administered 2016-08-22: 0.5 mg via INTRAVENOUS
  Administered 2016-08-22: 1 mg via INTRAVENOUS
  Administered 2016-08-22: 0.5 mg via INTRAVENOUS

## 2016-08-22 NOTE — Procedures (Signed)
Under US guidance, percutaneous biopsy of right cervical lymph node was performed. No immediate complication.

## 2016-08-22 NOTE — Procedures (Signed)
Procedure and risks discussed with patient and husband. Informed consent obtained. Will perform US-guided cervical lymph node biopsy.

## 2016-08-25 ENCOUNTER — Inpatient Hospital Stay (HOSPITAL_BASED_OUTPATIENT_CLINIC_OR_DEPARTMENT_OTHER): Payer: 59 | Admitting: Internal Medicine

## 2016-08-25 ENCOUNTER — Encounter: Payer: Self-pay | Admitting: Internal Medicine

## 2016-08-25 ENCOUNTER — Inpatient Hospital Stay: Payer: 59

## 2016-08-25 DIAGNOSIS — Z79899 Other long term (current) drug therapy: Secondary | ICD-10-CM

## 2016-08-25 DIAGNOSIS — R591 Generalized enlarged lymph nodes: Secondary | ICD-10-CM | POA: Diagnosis not present

## 2016-08-25 DIAGNOSIS — C801 Malignant (primary) neoplasm, unspecified: Secondary | ICD-10-CM

## 2016-08-25 DIAGNOSIS — R634 Abnormal weight loss: Secondary | ICD-10-CM

## 2016-08-25 DIAGNOSIS — I10 Essential (primary) hypertension: Secondary | ICD-10-CM | POA: Diagnosis not present

## 2016-08-25 DIAGNOSIS — C770 Secondary and unspecified malignant neoplasm of lymph nodes of head, face and neck: Secondary | ICD-10-CM | POA: Diagnosis not present

## 2016-08-25 DIAGNOSIS — R110 Nausea: Secondary | ICD-10-CM | POA: Diagnosis not present

## 2016-08-25 DIAGNOSIS — K5909 Other constipation: Secondary | ICD-10-CM | POA: Diagnosis not present

## 2016-08-25 LAB — COMPREHENSIVE METABOLIC PANEL
ALK PHOS: 104 U/L (ref 38–126)
ALT: 23 U/L (ref 14–54)
ANION GAP: 8 (ref 5–15)
AST: 34 U/L (ref 15–41)
Albumin: 4.5 g/dL (ref 3.5–5.0)
BUN: 7 mg/dL (ref 6–20)
CALCIUM: 9.5 mg/dL (ref 8.9–10.3)
CO2: 27 mmol/L (ref 22–32)
Chloride: 103 mmol/L (ref 101–111)
Creatinine, Ser: 0.4 mg/dL — ABNORMAL LOW (ref 0.44–1.00)
GFR calc non Af Amer: >60 mL/min (ref >60)
Glucose, Bld: 99 mg/dL (ref 65–99)
Potassium: 3.2 mmol/L — ABNORMAL LOW (ref 3.5–5.1)
SODIUM: 138 mmol/L (ref 135–145)
Total Bilirubin: 0.4 mg/dL (ref 0.3–1.2)
Total Protein: 8 g/dL (ref 6.5–8.1)

## 2016-08-25 LAB — LACTATE DEHYDROGENASE: LDH: 278 U/L — AB (ref 98–192)

## 2016-08-25 LAB — SURGICAL PATHOLOGY

## 2016-08-25 NOTE — Assessment & Plan Note (Signed)
#  Multiple small lymph nodes left more than right neck; also intra-abdominal retroperitoneal lymph node. Biopsy of the right neck- ultrasound core guided biopsy- positive for malignancy. No further delineation could be obtained.  # Recommend a PET scan; check tumor markers; multiple myeloma panel and LDH.  # I left a message for Dr.McQueen- to discuss the next strategy for biopsy.  #Tentatively follow-up with me 1 week after the PET scan to review the results.

## 2016-08-25 NOTE — Progress Notes (Signed)
Pt reports major concern is the lump on bottom base of neck, abdomen swelling with pain that comes and goes, sometimes when swallowing it feels like something is there or drainage.  Concerned about her muscles are wasting.  [facility] rehab following pt.

## 2016-08-25 NOTE — Progress Notes (Signed)
[facility] NOTE  Patient Care Team: Rusty Aus, MD as PCP - General (Internal Medicine)  CHIEF COMPLAINTS/PURPOSE OF CONSULTATION:   Oncology History   # LYMPHADENOPATHY- extensive- abdominal/RP/ bil neck LN R>L. [place] Bx- POSITIVE for MALIGNANCY  # Lung RUL nodule [benign s/p resection; 2009- Dr.Oaks]  # spring 2017- colonoscopy [Dr.Elliot; polyps]     Primary cancer of unknown site ([place])   08/25/2016 Initial Diagnosis    Primary cancer of unknown site [facility])       Oncology History   # LYMPHADENOPATHY- extensive- abdominal/RP/ bil neck LN R>L. [place] Bx- POSITIVE for MALIGNANCY  # Lung RUL nodule [benign s/p resection; 2009- Dr.Oaks]  # spring 2017- colonoscopy [Dr.Elliot; polyps]     Primary cancer of unknown site ([place])   08/25/2016 Initial Diagnosis    Primary cancer of unknown site ([place])       HISTORY OF PRESENTING ILLNESS:  Sylvia Jones 60 y.o.  female  multiple small bilateral neck adenopathy/ retroperitoneal abdominal adenopathy/bone lesions- is currently status post biopsy of the neck adenopathy ultrasound-guided- here to follow-up on pathology.  She continues to notice swelling on the left side of the neck; also noticed to have increasing generalized abdominal pain. No nausea no vomiting. Poor appetite.   Chronic constipation. No tingling and numbness. Mild nausea no vomiting. Feels poorly.   ROS: A complete 10 point review of system is done which is negative except mentioned above in history of present illness  MEDICAL HISTORY:  Past Medical History:  Diagnosis Date  . Arthritis   . Cervical disc disease   . Fracture 2017   left pubis bone-atypical  . Hypertension    off medication now-WNL  . Lymphadenopathy   . Tubular adenoma     SURGICAL HISTORY: Past Surgical History:  Procedure Laterality Date  . APPENDECTOMY  1986  . BREAST BIOPSY Right    at age 30  . CESAREAN SECTION  1988  . COLONOSCOPY WITH PROPOFOL N/A  07/12/2015   Procedure: COLONOSCOPY WITH PROPOFOL;  Surgeon: Manya Silvas, MD;  Location: [facility] ENDOSCOPY;  Service: Endoscopy;  Laterality: N/A;  . EXPLORATORY LAPAROTOMY  1986  . THORACOTOMY Right 03/2008   RLL Wedge Resection- Dr. Genevive Bi    SOCIAL HISTORY: lives in [place]; NO smoking/ alcohol- stay home/home schooled son; son-PA in [facility] Social History   Social History  . Marital status: Married    Spouse name: N/A  . Number of children: N/A  . Years of education: N/A   Occupational History  . Not on file.   Social History Main Topics  . Smoking status: Never Smoker  . Smokeless tobacco: Never Used  . Alcohol use No  . Drug use: No  . Sexual activity: Not on file   Other Topics Concern  . Not on file   Social History Narrative  . No narrative on file    FAMILY HISTORY: father- ? Cancer died 52. .. Brother- kasposi sarcoma Family History  Problem Relation Age of Onset  . Cancer Father   . Heart attack Father   . Hypertension Brother   . Diabetes Maternal Grandmother   . Diabetes Paternal Grandmother   . Cancer Brother     ALLERGIES:  is allergic to sulfa antibiotics.  MEDICATIONS:  Current Outpatient Prescriptions  Medication Sig Dispense Refill  . cyclobenzaprine (FLEXERIL) 10 MG tablet Take 5 mg by mouth.    . docusate sodium (COLACE) 100 MG capsule Take 100 mg by  mouth.    . losartan-hydrochlorothiazide (HYZAAR) 100-25 MG tablet Take by mouth.    . Multiple Vitamin (MULTIVITAMIN) tablet Take 1 tablet by mouth daily.    . naproxen sodium (ANAPROX) 220 MG tablet Take by mouth.    . traMADol (ULTRAM) 50 MG tablet Take by mouth.    . diazepam (VALIUM) 2 MG tablet Take 1 tablet (2 mg total) by mouth every 8 (eight) hours as needed for anxiety. (Patient not taking: Reported on 08/25/2016) 30 tablet 0   No current facility-administered medications for this visit.       Marland Kitchen  PHYSICAL EXAMINATION: ECOG PERFORMANCE STATUS: 1 - Symptomatic but completely  ambulatory  Vitals:   08/25/16 1540  BP: (!) 159/92  Pulse: 85  Resp: 17  Temp: 97.9 F (36.6 C)   Filed Weights   08/25/16 1540  Weight: 130 lb 3.2 oz (59.1 kg)    GENERAL: Well-nourished well-developed; Alert, no distress and comfortable.   With husband/son. Patient walking with pain EYES: no pallor or icterus OROPHARYNX: no thrush or ulceration; good dentition  NECK: supple, no masses felt LYMPH:  Matted lymphadenopathy noted in the left side of the neck; and also lymphadenopathy noted on the right neck posterior neck LUNGS: clear to auscultation and  No wheeze or crackles HEART/CVS: regular rate & rhythm and no murmurs; No lower extremity edema ABDOMEN: abdomen soft, non-tender and normal bowel sounds Musculoskeletal:no cyanosis of digits and no clubbing  PSYCH: alert & oriented x 3 with fluent speech NEURO: no focal motor/sensory deficits SKIN:  no rashes or significant lesions Right and left BREAST exam [in the presence of nurse]- no unusual skin changes or dominant masses felt.    LABORATORY DATA:  I have reviewed the data as listed Lab Results  Component Value Date   WBC 4.9 08/13/2016   HGB 14.8 08/13/2016   HCT 41.9 08/13/2016   MCV 85.6 08/13/2016   PLT 250 08/13/2016    Recent Labs  08/11/16 1030 08/13/16 1411 08/25/16 1632  NA 122* 136 138  K 3.2* 3.4* 3.2*  CL 85* 96* 103  CO2 23 33* 27  GLUCOSE 83 112* 99  BUN 12 7 7   CREATININE 0.46 0.45 0.40*  CALCIUM 10.5* 9.7 9.5  GFRNONAA >60 >60 >60  GFRAA >60 >60 >60  PROT 8.1  --  8.0  ALBUMIN 4.9  --  4.5  AST 49*  --  34  ALT 41  --  23  ALKPHOS 121  --  104  BILITOT 1.5*  --  0.4    RADIOGRAPHIC STUDIES: I have personally reviewed the radiological images as listed and agreed with the findings in the report. Dg Cervical Spine Complete  Result Date: 08/13/2016 CLINICAL DATA:  Patient with known pelvic fracture. Osseous metastasis. Upper back pain after bending over. EXAM: CERVICAL SPINE -  COMPLETE 4+ VIEW COMPARISON:  CT CAP 08/07/2016; cervical spine MRI 10/31/2010 FINDINGS: Normal anatomic alignment. No evidence for acute fracture or dislocation. Lateral masses articulate appropriately with the dens. Lung apices are clear. Prevertebral soft tissues are unremarkable. Osseous sclerosis involving the medial first rib posteriorly. IMPRESSION: No acute osseous abnormality. Osseous sclerosis involving the posterior medial left first rib concerning for metastasis. Electronically Signed   By: Lovey Newcomer M.D.   On: 08/13/2016 14:54   Dg Thoracic Spine 2 View  Result Date: 08/13/2016 CLINICAL DATA:  Patient with pain in the upper thoracic spine after bending over. Initial encounter. History of osseous metastasis. EXAM: THORACIC SPINE  2 VIEWS COMPARISON:  CT CAP 08/07/2016 FINDINGS: Normal anatomic alignment. No evidence for acute fracture dislocation. Re- demonstrated oval lucency within the anterior aspect of the T2 vertebral body, most compatible with osseous metastasis. Additionally there is patchy sclerosis involving the posterior medial left first rib. IMPRESSION: No acute osseous abnormality. 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 Electronically Signed   By: Lovey Newcomer M.D.   On: 08/13/2016 14:59   Ct Soft Tissue Neck W Contrast  Result Date: 08/11/2016 CLINICAL DATA:  Generalized lymphadenopathy including in the neck, abdomen, and pelvis. EXAM: CT NECK WITH CONTRAST TECHNIQUE: Multidetector CT imaging of the neck was performed using the standard protocol following the bolus administration of intravenous contrast. CONTRAST:  61m ISOVUE-300 IOPAMIDOL (ISOVUE-300) INJECTION 61% COMPARISON:  Chest CT 08/07/2016. FINDINGS: Pharynx and larynx: No pharyngeal mass or parapharyngeal inflammatory change. Unremarkable larynx. Salivary glands: Submandibular and parotid glands are unremarkable. Thyroid: Unremarkable. Lymph nodes: There is an increased  number of relatively small but abnormal lymph nodes throughout the anterior and posterior cervical stations bilaterally. Level II lymph nodes measure up to 9 mm in short axis bilaterally. Level V lymph nodes measure up to 8 mm in short axis on the right. Larger lymph nodes are partially rounded in configuration with heterogeneous enhancement. Subcentimeter supraclavicular lymph nodes are present bilaterally. Vascular: Major vascular structures in the neck appear patent. Limited intracranial: Unremarkable. Visualized orbits: Unremarkable. Mastoids and visualized paranasal sinuses: Clear. Skeleton: C5 vertebral body hemangioma. 1.1 cm lytic lesion in the T2 vertebral body and irregular sclerosis in the left first rib as described on recent chest CT. Upper chest: Mild biapical pleural-parenchymal scarring. No enlarged lymph nodes in the superior mediastinum. Other: None. IMPRESSION: 1. Bilateral cervical lymphadenopathy concerning for malignancy. 2. T2 vertebral body lesion suspicious for metastasis. Electronically Signed   By: ALogan BoresM.D.   On: 08/11/2016 17:15   Ct Chest W Contrast  Result Date: 08/07/2016 : Please see accession number [id number] the full report for the CT chest, abdomen, and pelvis. Electronically Signed   By: WVan ClinesM.D.   On: 08/07/2016 16:32   Ct Abdomen Pelvis W Contrast  Result Date: 08/07/2016 CLINICAL DATA:  Epigastric pain for 1 week. History of benign lung nodule. Lymphadenopathy found on physical exam. EXAM: CT CHEST, ABDOMEN, AND PELVIS WITH CONTRAST TECHNIQUE: Multidetector CT imaging of the chest, abdomen and pelvis was performed following the standard protocol during bolus administration of intravenous contrast. CONTRAST:  81mISOVUE-300 IOPAMIDOL (ISOVUE-300) INJECTION 61% COMPARISON:  07/01/2016 MRI pelvis; nuclear medicine PET-CT from 02/20/2008 FINDINGS: CT CHEST FINDINGS Cardiovascular: Unremarkable Mediastinum/Nodes: Mild atherosclerotic  calcification in the left subclavian artery wall. Small calcified subcarinal and right infrahilar lymph nodes compatible with old granulomatous disease. No pathologic axillary or subpectoral adenopathy. Small supraclavicular lymph nodes on the left are not pathologically enlarged, the largest is about 5 mm in diameter on image 4/2. Similarly there is no pathologic thoracic adenopathy. Lungs/Pleura: Biapical pleuroparenchymal scarring, not appreciably changed from 2009. Prior wedge resection in the right lower lobe at the site of the prior pulmonary nodule. There is a small calcified granuloma peripherally in the right lower lobe on image 94/4, measuring 3 mm in diameter. Minimal subsegmental atelectasis anteriorly in the left lower lobe. Trace left pleural effusion. Musculoskeletal: Thoracic spondylosis. Sclerosis posteriorly in the left first rib, new compared to 3/20 04/2008, significance uncertain. Focal lucency in the left anterior T2 vertebral body, 1 cm in diameter on  image 22/4, not readily appreciable on 02/20/2008. CT ABDOMEN PELVIS FINDINGS Hepatobiliary: Unremarkable Pancreas: Unremarkable Spleen: Old granulomatous disease. Adrenals/Urinary Tract: Unremarkable Stomach/Bowel: Unremarkable Vascular/Lymphatic: There is gastrohepatic ligament, porta hepatis, mesenteric, retroperitoneal, and left pelvic adenopathy. Representative nodes include a 1.2 cm in short axis gastrohepatic lymph node on image 50/2; an indistinct 1.5 cm in short axis peripancreatic node on image 55/2; a left periaortic node measuring 1.2 cm on image 66/2; a left a common iliac node measuring 1.4 cm in short axis on image 80/2 ; a left external iliac node measuring 1.3 cm in short axis on image 96/2 ; and a mesenteric lymph node measuring 1.1 cm in short axis on image 85/2. The adenopathy in the retroperitoneum and mesentery is somewhat confluent. No significant atherosclerotic calcification. Reproductive: Unremarkable Other: Stranding/  edema in the mesentery and retroperitoneum. Presacral edema. Musculoskeletal: Sclerosis and some lucency in the left pubic body suspicious for a vertically oriented fracture. The lack of fracture elsewhere in the bony pelvis is a unusual feature. There is potentially a destructive lesion in the left superior pubic ramus Jason to the fracture, for example on image 40/5. IMPRESSION: 1. Pathologic adenopathy in the abdomen and pelvis suspicious for malignancy, potentially lymphoma. Tissue diagnosis recommended. 2. There several bony lesions suspicious for osseous metastatic disease including a sclerotic lesion in the left first rib medially ; a lucent lesion in the left side of the T2 vertebral body; and abnormal lucency in the left superior pubic ramus adjacent to a site of fracture, raising concern for a pathologic fracture. Other imaging findings of potential clinical significance: Minimal atherosclerosis in the chest. Prior wedge resection in the right lower lobe. Old granulomatous disease. Presacral edema. Electronically Signed   By: Van Clines M.D.   On: 08/07/2016 15:16   US Biopsy  Result Date: 08/22/2016 INDICATION: Right cervical lymph node. EXAM: ULTRASOUND BIOPSY CORE LIVER MEDICATIONS: None. ANESTHESIA/SEDATION: Moderate (conscious) sedation was employed during this procedure. A total of Versed 2.0 mg and Fentanyl 50 mcg was administered intravenously. Moderate Sedation Time: 24 minutes. The patient's level of consciousness and vital signs were monitored continuously by radiology nursing throughout the procedure under my direct supervision. COMPLICATIONS: None immediate. PROCEDURE: Informed written consent was obtained from the patient after a thorough discussion of the procedural risks, benefits and alternatives. All questions were addressed. Sterile Barrier Technique was utilized including mask, sterile gloves, sterile drape, hand hygiene and skin antiseptic. A timeout was performed prior to  the initiation of the procedure. Patient was placed supine on the structure. The right neck was prepped and draped in sterile technique. Local anesthetic was applied. Under real-time ultrasound guidance, 17 gauge guiding needle was directed into right cervical lymph node. Four core samples were obtained using 18 gauge biopsy needle. These were given to pathology technologist who was present at that time. Needles were removed and appropriate dressing was applied. IMPRESSION: Under real-time ultrasound guidance, percutaneous core biopsy of right cervical lymph node was performed. Electronically Signed   By: Marijo Conception, M.D.   On: 08/22/2016 10:02    ASSESSMENT & PLAN:   Primary cancer of unknown site [facility]) # Multiple small lymph nodes left more than right neck; also intra-abdominal retroperitoneal lymph node. Biopsy of the right neck- ultrasound core guided biopsy- positive for malignancy. No further delineation could be obtained.  # Recommend a PET scan; check tumor markers; multiple myeloma panel and LDH.  # I left a message for Dr.McQueen- to discuss the next strategy for  biopsy.  #Tentatively follow-up with me 1 week after the PET scan to review the results.    # A copy of the pathology report was given       Cammie Sickle, MD 08/25/2016 5:47 PM

## 2016-08-26 LAB — HEPATITIS C ANTIBODY: HCV Ab: <0.1 s/co ratio (ref 0.0–0.9)

## 2016-08-26 LAB — CANCER ANTIGEN 19-9: CA 19 9: 336 U/mL — AB (ref 0–35)

## 2016-08-26 LAB — CANCER ANTIGEN 15-3: CA 15-3: 145.1 U/mL — ABNORMAL HIGH (ref 0.0–25.0)

## 2016-08-26 LAB — CANCER ANTIGEN 27.29: CA 27.29: 226.8 U/mL — AB (ref 0.0–38.6)

## 2016-08-26 LAB — CA 125: CA 125: 206.3 U/mL — ABNORMAL HIGH (ref 0.0–38.1)

## 2016-08-26 LAB — CEA: CEA: 3.6 ng/mL (ref 0.0–4.7)

## 2016-08-28 ENCOUNTER — Encounter: Payer: Self-pay | Admitting: Internal Medicine

## 2016-08-28 LAB — KAPPA/LAMBDA LIGHT CHAINS
KAPPA, LAMDA LIGHT CHAIN RATIO: 1.1 (ref 0.26–1.65)
Kappa free light chain: 15.9 mg/L (ref 3.3–19.4)
LAMDA FREE LIGHT CHAINS: 14.4 mg/L (ref 5.7–26.3)

## 2016-08-29 ENCOUNTER — Telehealth: Payer: Self-pay | Admitting: Internal Medicine

## 2016-08-29 ENCOUNTER — Encounter
Admission: RE | Admit: 2016-08-29 | Discharge: 2016-08-29 | Disposition: A | Discharge status: Home / Self Care | Payer: 59 | Source: Ambulatory Visit | Attending: Internal Medicine | Admitting: Internal Medicine

## 2016-08-29 DIAGNOSIS — C801 Malignant (primary) neoplasm, unspecified: Secondary | ICD-10-CM | POA: Insufficient documentation

## 2016-08-29 LAB — MULTIPLE MYELOMA PANEL, SERUM
ALBUMIN SERPL ELPH-MCNC: 4.2 g/dL (ref 2.9–4.4)
ALPHA 1: 0.3 g/dL (ref 0.0–0.4)
ALPHA2 GLOB SERPL ELPH-MCNC: 0.7 g/dL (ref 0.4–1.0)
Albumin/Glob SerPl: 1.5 (ref 0.7–1.7)
B-GLOBULIN SERPL ELPH-MCNC: 1 g/dL (ref 0.7–1.3)
Gamma Glob SerPl Elph-Mcnc: 0.8 g/dL (ref 0.4–1.8)
Globulin, Total: 2.9 g/dL (ref 2.2–3.9)
IGG (IMMUNOGLOBIN G), SERUM: 956 mg/dL (ref 700–1600)
IGM, SERUM: 130 mg/dL (ref 26–217)
IgA: 156 mg/dL (ref 87–352)
TOTAL PROTEIN ELP: 7.1 g/dL (ref 6.0–8.5)

## 2016-08-29 LAB — GLUCOSE, CAPILLARY: Glucose-Capillary: 88 mg/dL (ref 65–99)

## 2016-08-29 MED ORDER — FLUDEOXYGLUCOSE F - 18 (FDG) INJECTION
13.2800 | Freq: Once | INTRAVENOUS | Status: AC | PRN
  Administered 2016-08-29: 13.28 via INTRAVENOUS

## 2016-08-29 NOTE — Telephone Encounter (Signed)
Left a message for the patient to discuss the results of the PET scan. I also spoke to Dr.Mcqueen- who agrees to evaluate the patient ASAP for neck LN Bx.  Heather- please try to reach pt again; and also speak to Dr.Mcqueen's RN Olivia Mackie to get pt appt ASAP.

## 2016-08-30 ENCOUNTER — Other Ambulatory Visit: Payer: Self-pay | Admitting: *Deleted

## 2016-08-30 ENCOUNTER — Encounter: Payer: Self-pay | Admitting: *Deleted

## 2016-08-30 DIAGNOSIS — R591 Generalized enlarged lymph nodes: Secondary | ICD-10-CM

## 2016-08-30 NOTE — Telephone Encounter (Signed)
Dr. Milderd Meager with patient and husband this morning.  Referral initiated @ ENT.

## 2016-08-31 ENCOUNTER — Ambulatory Visit: Payer: 59

## 2016-08-31 ENCOUNTER — Other Ambulatory Visit: Payer: Self-pay | Admitting: Unknown Physician Specialty

## 2016-08-31 DIAGNOSIS — R222 Localized swelling, mass and lump, trunk: Secondary | ICD-10-CM

## 2016-08-31 DIAGNOSIS — D489 Neoplasm of uncertain behavior, unspecified: Secondary | ICD-10-CM | POA: Diagnosis not present

## 2016-09-01 ENCOUNTER — Other Ambulatory Visit: Payer: Self-pay | Admitting: General Surgery

## 2016-09-01 ENCOUNTER — Other Ambulatory Visit: Payer: Self-pay | Admitting: Radiology

## 2016-09-04 ENCOUNTER — Encounter
Admission: RE | Admit: 2016-09-04 | Discharge: 2016-09-04 | Disposition: A | Discharge status: Home / Self Care | Payer: 59 | Source: Ambulatory Visit | Attending: Unknown Physician Specialty | Admitting: Unknown Physician Specialty

## 2016-09-04 ENCOUNTER — Ambulatory Visit
Admission: RE | Admit: 2016-09-04 | Discharge: 2016-09-04 | Disposition: A | Discharge status: Home / Self Care | Payer: 59 | Source: Ambulatory Visit | Attending: Unknown Physician Specialty | Admitting: Unknown Physician Specialty

## 2016-09-04 DIAGNOSIS — C770 Secondary and unspecified malignant neoplasm of lymph nodes of head, face and neck: Secondary | ICD-10-CM | POA: Diagnosis not present

## 2016-09-04 DIAGNOSIS — D369 Benign neoplasm, unspecified site: Secondary | ICD-10-CM | POA: Diagnosis not present

## 2016-09-04 DIAGNOSIS — R222 Localized swelling, mass and lump, trunk: Secondary | ICD-10-CM | POA: Insufficient documentation

## 2016-09-04 DIAGNOSIS — C7951 Secondary malignant neoplasm of bone: Secondary | ICD-10-CM | POA: Insufficient documentation

## 2016-09-04 DIAGNOSIS — R221 Localized swelling, mass and lump, neck: Secondary | ICD-10-CM | POA: Diagnosis not present

## 2016-09-04 DIAGNOSIS — M5080 Other cervical disc disorders, unspecified cervical region: Secondary | ICD-10-CM | POA: Insufficient documentation

## 2016-09-04 DIAGNOSIS — M1990 Unspecified osteoarthritis, unspecified site: Secondary | ICD-10-CM | POA: Insufficient documentation

## 2016-09-04 DIAGNOSIS — I10 Essential (primary) hypertension: Secondary | ICD-10-CM | POA: Insufficient documentation

## 2016-09-04 DIAGNOSIS — R590 Localized enlarged lymph nodes: Secondary | ICD-10-CM | POA: Diagnosis not present

## 2016-09-04 HISTORY — DX: Malignant (primary) neoplasm, unspecified: C80.1

## 2016-09-04 HISTORY — DX: Myoneural disorder, unspecified: G70.9

## 2016-09-04 MED ORDER — ACETAMINOPHEN-CODEINE #3 300-30 MG PO TABS
1.0000 | ORAL_TABLET | Freq: Once | ORAL | Status: AC
  Administered 2016-09-04: 1 via ORAL

## 2016-09-04 MED ORDER — SODIUM CHLORIDE 0.9 % IV SOLN
INTRAVENOUS | Status: DC
  Administered 2016-09-04: 08:00:00 via INTRAVENOUS

## 2016-09-04 MED ORDER — FENTANYL CITRATE (PF) 100 MCG/2ML IJ SOLN
INTRAMUSCULAR | Status: AC | PRN
  Administered 2016-09-04 (×2): 25 ug via INTRAVENOUS
  Administered 2016-09-04: 50 ug via INTRAVENOUS

## 2016-09-04 MED ORDER — MIDAZOLAM HCL 2 MG/2ML IJ SOLN
INTRAMUSCULAR | Status: AC | PRN
  Administered 2016-09-04: 1 mg via INTRAVENOUS
  Administered 2016-09-04 (×2): 0.5 mg via INTRAVENOUS

## 2016-09-04 MED ORDER — ACETAMINOPHEN-CODEINE #3 300-30 MG PO TABS
ORAL_TABLET | ORAL | Status: AC
  Administered 2016-09-04: 1 via ORAL
  Filled 2016-09-04: qty 1

## 2016-09-04 NOTE — H&P (Signed)
Chief Complaint: Patient was seen in consultation today for No chief complaint on file.  at the request of [place]  Referring Physician(s): [place]  Supervising Physician: Marybelle Killings  Patient Status: Outpatient  History of Present Illness: Sylvia Jones is a 60 y.o. female with adenopathy and bone lesions. A recent biopsy was insufficient and repeat is requested. A recent PET/CT showed hypermetabolic cervical nodes, right greater than left. There was no defined hypermetabolic left supraclavicular mass. She complains of left neck pain. No history of malignancy. A prior lung biopsy was negative.  Past Medical History:  Diagnosis Date  . Arthritis   . Cervical disc disease   . Fracture 2017   left pubis bone-atypical  . Hypertension    off medication now-WNL  . Lymphadenopathy   . Tubular adenoma     Past Surgical History:  Procedure Laterality Date  . APPENDECTOMY  1986  . BREAST BIOPSY Right    at age 57  . CESAREAN SECTION  1988  . COLONOSCOPY WITH PROPOFOL N/A 07/12/2015   Procedure: COLONOSCOPY WITH PROPOFOL;  Surgeon: Manya Silvas, MD;  Location: [facility] ENDOSCOPY;  Service: Endoscopy;  Laterality: N/A;  . EXPLORATORY LAPAROTOMY  1986  . THORACOTOMY Right 03/2008   RLL Wedge Resection- Dr. Genevive Bi    Allergies: Sulfa antibiotics  Medications: Prior to Admission medications   Medication Sig Start Date End Date Taking? Authorizing Provider  acetaminophen (TYLENOL) 325 MG tablet Take 650 mg by mouth every 6 (six) hours as needed.   Yes Historical Provider, MD  cyclobenzaprine (FLEXERIL) 10 MG tablet Take 5 mg by mouth. 03/08/16  Yes Historical Provider, MD  docusate sodium (COLACE) 100 MG capsule Take 100 mg by mouth.   Yes Historical Provider, MD  diazepam (VALIUM) 2 MG tablet Take 1 tablet (2 mg total) by mouth every 8 (eight) hours as needed for anxiety. Patient not taking: Reported on 09/04/2016 08/13/16 08/13/17  Jami L Hagler, PA-C    losartan-hydrochlorothiazide (HYZAAR) 100-25 MG tablet Take by mouth. 08/08/16 08/08/17  Historical Provider, MD  Multiple Vitamin (MULTIVITAMIN) tablet Take 1 tablet by mouth daily.    Historical Provider, MD  naproxen sodium (ANAPROX) 220 MG tablet Take by mouth.    Historical Provider, MD  traMADol (ULTRAM) 50 MG tablet Take by mouth. 06/30/16   Historical Provider, MD     Family History  Problem Relation Age of Onset  . Cancer Father   . Heart attack Father   . Hypertension Brother   . Diabetes Maternal Grandmother   . Diabetes Paternal Grandmother   . Cancer Brother     Social History   Social History  . Marital status: Married    Spouse name: N/A  . Number of children: N/A  . Years of education: N/A   Social History Main Topics  . Smoking status: Never Smoker  . Smokeless tobacco: Never Used  . Alcohol use No  . Drug use: No  . Sexual activity: Not Asked   Other Topics Concern  . None   Social History Narrative  . None     Review of Systems: A 12 point ROS discussed and pertinent positives are indicated in the HPI above.  All other systems are negative.  Review of Systems  Vital Signs: BP (!) 151/94   Pulse 80   Temp 98.5 F (36.9 C) (Oral)   Resp 15   LMP  (Approximate) Comment: 2012  SpO2 98%   Physical Exam  Constitutional: She is oriented  to person, place, and time. She appears well-developed and well-nourished.  Neck: Normal range of motion. Neck supple.  Cardiovascular: Normal rate and regular rhythm.   Pulmonary/Chest: Effort normal and breath sounds normal.  Neurological: She is alert and oriented to person, place, and time.    Mallampati Score:   1  Imaging: Dg Cervical Spine Complete  Result Date: 08/13/2016 CLINICAL DATA:  Patient with known pelvic fracture. Osseous metastasis. Upper back pain after bending over. EXAM: CERVICAL SPINE - COMPLETE 4+ VIEW COMPARISON:  CT CAP 08/07/2016; cervical spine MRI 10/31/2010 FINDINGS: Normal  anatomic alignment. No evidence for acute fracture or dislocation. Lateral masses articulate appropriately with the dens. Lung apices are clear. Prevertebral soft tissues are unremarkable. Osseous sclerosis involving the medial first rib posteriorly. IMPRESSION: No acute osseous abnormality. Osseous sclerosis involving the posterior medial left first rib concerning for metastasis. Electronically Signed   By: Lovey Newcomer M.D.   On: 08/13/2016 14:54   Dg Thoracic Spine 2 View  Result Date: 08/13/2016 CLINICAL DATA:  Patient with pain in the upper thoracic spine after bending over. Initial encounter. History of osseous metastasis. EXAM: THORACIC SPINE 2 VIEWS COMPARISON:  CT CAP 08/07/2016 FINDINGS: Normal anatomic alignment. No evidence for acute fracture dislocation. Re- demonstrated oval lucency within the anterior aspect of the T2 vertebral body, most compatible with osseous metastasis. Additionally there is patchy sclerosis involving the posterior medial left first rib. IMPRESSION: No acute osseous abnormality. 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 Electronically Signed   By: Lovey Newcomer M.D.   On: 08/13/2016 14:59   Ct Soft Tissue Neck W Contrast  Result Date: 08/11/2016 CLINICAL DATA:  Generalized lymphadenopathy including in the neck, abdomen, and pelvis. EXAM: CT NECK WITH CONTRAST TECHNIQUE: Multidetector CT imaging of the neck was performed using the standard protocol following the bolus administration of intravenous contrast. CONTRAST:  84mL ISOVUE-300 IOPAMIDOL (ISOVUE-300) INJECTION 61% COMPARISON:  Chest CT 08/07/2016. FINDINGS: Pharynx and larynx: No pharyngeal mass or parapharyngeal inflammatory change. Unremarkable larynx. Salivary glands: Submandibular and parotid glands are unremarkable. Thyroid: Unremarkable. Lymph nodes: There is an increased number of relatively small but abnormal lymph nodes throughout the anterior and posterior cervical  stations bilaterally. Level II lymph nodes measure up to 9 mm in short axis bilaterally. Level V lymph nodes measure up to 8 mm in short axis on the right. Larger lymph nodes are partially rounded in configuration with heterogeneous enhancement. Subcentimeter supraclavicular lymph nodes are present bilaterally. Vascular: Major vascular structures in the neck appear patent. Limited intracranial: Unremarkable. Visualized orbits: Unremarkable. Mastoids and visualized paranasal sinuses: Clear. Skeleton: C5 vertebral body hemangioma. 1.1 cm lytic lesion in the T2 vertebral body and irregular sclerosis in the left first rib as described on recent chest CT. Upper chest: Mild biapical pleural-parenchymal scarring. No enlarged lymph nodes in the superior mediastinum. Other: None. IMPRESSION: 1. Bilateral cervical lymphadenopathy concerning for malignancy. 2. T2 vertebral body lesion suspicious for metastasis. Electronically Signed   By: Logan Bores M.D.   On: 08/11/2016 17:15   Ct Chest W Contrast  Result Date: 08/07/2016 : Please see accession number [id number] for the full report for the CT chest, abdomen, and pelvis. Electronically Signed   By: Van Clines M.D.   On: 08/07/2016 16:32   Ct Abdomen Pelvis W Contrast  Result Date: 08/07/2016 CLINICAL DATA:  Epigastric pain for 1 week. History of benign lung nodule. Lymphadenopathy found on physical exam. EXAM: CT CHEST, ABDOMEN, AND  PELVIS WITH CONTRAST TECHNIQUE: Multidetector CT imaging of the chest, abdomen and pelvis was performed following the standard protocol during bolus administration of intravenous contrast. CONTRAST:  32mL ISOVUE-300 IOPAMIDOL (ISOVUE-300) INJECTION 61% COMPARISON:  07/01/2016 MRI pelvis; nuclear medicine PET-CT from 02/20/2008 FINDINGS: CT CHEST FINDINGS Cardiovascular: Unremarkable Mediastinum/Nodes: Mild atherosclerotic calcification in the left subclavian artery wall. Small calcified subcarinal and right infrahilar lymph nodes  compatible with old granulomatous disease. No pathologic axillary or subpectoral adenopathy. Small supraclavicular lymph nodes on the left are not pathologically enlarged, the largest is about 5 mm in diameter on image 4/2. Similarly there is no pathologic thoracic adenopathy. Lungs/Pleura: Biapical pleuroparenchymal scarring, not appreciably changed from 2009. Prior wedge resection in the right lower lobe at the site of the prior pulmonary nodule. There is a small calcified granuloma peripherally in the right lower lobe on image 94/4, measuring 3 mm in diameter. Minimal subsegmental atelectasis anteriorly in the left lower lobe. Trace left pleural effusion. Musculoskeletal: Thoracic spondylosis. Sclerosis posteriorly in the left first rib, new compared to 3/20 04/2008, significance uncertain. Focal lucency in the left anterior T2 vertebral body, 1 cm in diameter on image 22/4, not readily appreciable on 02/20/2008. CT ABDOMEN PELVIS FINDINGS Hepatobiliary: Unremarkable Pancreas: Unremarkable Spleen: Old granulomatous disease. Adrenals/Urinary Tract: Unremarkable Stomach/Bowel: Unremarkable Vascular/Lymphatic: There is gastrohepatic ligament, porta hepatis, mesenteric, retroperitoneal, and left pelvic adenopathy. Representative nodes include a 1.2 cm in short axis gastrohepatic lymph node on image 50/2; an indistinct 1.5 cm in short axis peripancreatic node on image 55/2; a left periaortic node measuring 1.2 cm on image 66/2; a left a common iliac node measuring 1.4 cm in short axis on image 80/2 ; a left external iliac node measuring 1.3 cm in short axis on image 96/2 ; and a mesenteric lymph node measuring 1.1 cm in short axis on image 85/2. The adenopathy in the retroperitoneum and mesentery is somewhat confluent. No significant atherosclerotic calcification. Reproductive: Unremarkable Other: Stranding/ edema in the mesentery and retroperitoneum. Presacral edema. Musculoskeletal: Sclerosis and some lucency in the  left pubic body suspicious for a vertically oriented fracture. The lack of fracture elsewhere in the bony pelvis is a unusual feature. There is potentially a destructive lesion in the left superior pubic ramus Jason to the fracture, for example on image 40/5. IMPRESSION: 1. Pathologic adenopathy in the abdomen and pelvis suspicious for malignancy, potentially lymphoma. Tissue diagnosis recommended. 2. There several bony lesions suspicious for osseous metastatic disease including a sclerotic lesion in the left first rib medially ; a lucent lesion in the left side of the T2 vertebral body; and abnormal lucency in the left superior pubic ramus adjacent to a site of fracture, raising concern for a pathologic fracture. Other imaging findings of potential clinical significance: Minimal atherosclerosis in the chest. Prior wedge resection in the right lower lobe. Old granulomatous disease. Presacral edema. Electronically Signed   By: Van Clines M.D.   On: 08/07/2016 15:16   Nm Pet Image Initial (pi) Skull Base To Thigh  Result Date: 08/29/2016 CLINICAL DATA:  Initial treatment strategy for primary cancer of unknown site. EXAM: NUCLEAR MEDICINE PET SKULL BASE TO THIGH TECHNIQUE: 13.28 mCi F-18 FDG was injected intravenously. Full-ring PET imaging was performed from the skull base to thigh after the radiotracer. CT data was obtained and used for attenuation correction and anatomic localization. FASTING BLOOD GLUCOSE:  Value: 88 mg/dl COMPARISON:  CT eat of the chest status pelvis from 08/07/2016. FINDINGS: NECK Hypermetabolic bilateral cervical lymph nodes are identified. Index  left level 2 lymph node measures 8 mm and has an SUV max equal to 8.8. Right level 2 lymph measures 7 mm and has an SUV max equal to 7.7, image 24 of series 3. CHEST No hypermetabolic mediastinal or hilar nodes. No suspicious pulmonary nodules on the CT scan. Increased radiotracer uptake within the distal esophagus noted. SUV max equals  4.9. Calcified granuloma is identified within the right lower lobe. There is a small left pleural effusion noted. No hypermetabolic pulmonary nodules identified ABDOMEN/PELVIS No abnormal hypermetabolic activity within the liver, pancreas, adrenal glands, or spleen. Extensive hypermetabolic upper abdominal adenopathy identified. Index gastrohepatic ligament node measures 1 cm and has an SUV max equal to 4.8. Portahepatic node measures 1.6 cm and has an SUV max equal to 12.5. Hypermetabolic periaortic node measures 1 cm and has an SUV max equal to 9.14. Numerous hypermetabolic mesenteric nodes identified. Index node with in the jejunal mesenteric measures 1 cm and has an SUV max equal to 6.6. Hypermetabolic left common iliac and left external iliac nodes identified. Left external iliac node measures 1.5 cm and has an SUV max equal to 9.1. SKELETON Multifocal hypermetabolic lytic bone metastases are identified. Index lytic lesion involving the posterior elements of C7 has an SUV max equal to 7.5, image 45 of series 3. Hypermetabolic lytic lesion involving the posterior aspect of the left first rib has an SUV max equal to 6.28, image 52 of series 3. Lytic lesion involving the T3 vertebra has an SUV max equal to 5.17. Destructive bone lesion involving the left pubic bone with associated pathologic fracture is identified, image 232 of series 3. SUV max is equal to 10.9. IMPRESSION: 1. Examination is positive for hypermetabolic metastatic adenopathy within the neck, abdomen and pelvis. 2. Multifocal hypermetabolic lytic bone metastases including lesion involving the T3 vertebra. 3. Nonspecific increased radiotracer uptake localizes to the distal aspect of the esophagus. Electronically Signed   By: Kerby Moors M.D.   On: 08/29/2016 14:38   US Biopsy  Result Date: 08/22/2016 INDICATION: Right cervical lymph node. EXAM: ULTRASOUND BIOPSY CORE LIVER MEDICATIONS: None. ANESTHESIA/SEDATION: Moderate (conscious) sedation  was employed during this procedure. A total of Versed 2.0 mg and Fentanyl 50 mcg was administered intravenously. Moderate Sedation Time: 24 minutes. The patient's level of consciousness and vital signs were monitored continuously by radiology nursing throughout the procedure under my direct supervision. COMPLICATIONS: None immediate. PROCEDURE: Informed written consent was obtained from the patient after a thorough discussion of the procedural risks, benefits and alternatives. All questions were addressed. Sterile Barrier Technique was utilized including mask, sterile gloves, sterile drape, hand hygiene and skin antiseptic. A timeout was performed prior to the initiation of the procedure. Patient was placed supine on the structure. The right neck was prepped and draped in sterile technique. Local anesthetic was applied. Under real-time ultrasound guidance, 17 gauge guiding needle was directed into right cervical lymph node. Four core samples were obtained using 18 gauge biopsy needle. These were given to pathology technologist who was present at that time. Needles were removed and appropriate dressing was applied. IMPRESSION: Under real-time ultrasound guidance, percutaneous core biopsy of right cervical lymph node was performed. Electronically Signed   By: Marijo Conception, M.D.   On: 08/22/2016 10:02    Labs:  CBC:  Recent Labs  08/11/16 1030 08/13/16 1411  WBC 6.7 4.9  HGB 14.5 14.8  HCT 40.8 41.9  PLT 241 250    COAGS:  Recent Labs  08/11/16 1226  INR 0.89  APTT 29    BMP:  Recent Labs  08/11/16 1030 08/13/16 1411 08/25/16 1632  NA 122* 136 138  K 3.2* 3.4* 3.2*  CL 85* 96* 103  CO2 23 33* 27  GLUCOSE 83 112* 99  BUN 12 7 7   CALCIUM 10.5* 9.7 9.5  CREATININE 0.46 0.45 0.40*  GFRNONAA >60 >60 >60  GFRAA >60 >60 >60    LIVER FUNCTION TESTS:  Recent Labs  08/11/16 1030 08/25/16 1632  BILITOT 1.5* 0.4  AST 49* 34  ALT 41 23  ALKPHOS 121 104  PROT 8.1 8.0    ALBUMIN 4.9 4.5    TUMOR MARKERS:  Recent Labs  08/25/16 1632  CEA 3.6  CA199 336*    Assessment and Plan:  Neck adenopathy. Biopsy to follow.  Thank you for this interesting consult.  I greatly enjoyed meeting Joyceline A Nees and look forward to participating in their care.  A copy of this report was sent to the requesting provider on this date.  Electronically Signed: Alauna Hayden, ART A 09/04/2016, 8:14 AM   I spent a total of  40 Minutes   in face to face in clinical consultation, greater than 50% of which was counseling/coordinating care for lymph node biopsy.

## 2016-09-04 NOTE — Patient Instructions (Signed)
  Your procedure is scheduled on: September 05, 2016 Report to [facility], Second Floor  please call [phone]  If you find out before 4 pm that this surgery is to be cancelled.  Remember: Instructions that are not followed completely may result in serious medical risk, up to and including death, or upon the discretion of your surgeon and anesthesiologist your surgery may need to be rescheduled.    __X__ 1. Do not eat food or drink liquids after midnight. No gum chewing or hard candies.      ____ 2. No Alcohol for 24 hours before or after surgery.   ____ 3. Do Not Smoke For 24 Hours Prior to Your Surgery.   ____ 4. Bring all medications with you on the day of surgery if instructed.    ____ 5. Notify your doctor if there is any change in your medical condition     (cold, fever, infections).       Do not wear jewelry, make-up, hairpins, clips or nail polish.  Do not wear lotions, powders, or perfumes. You may wear deodorant.  Do not shave 48 hours prior to surgery. Men may shave face and neck.  Do not bring valuables to the hospital.    [facility] is not responsible for any belongings or valuables.               Contacts, dentures or bridgework may not be worn into surgery.  Leave your suitcase in the car. After surgery it may be brought to your room.  For patients admitted to the hospital, discharge time is determined by your                treatment team.   Patients discharged the day of surgery will not be allowed to drive home.   Please read over the following fact sheets that you were given:   Soap ChG prior to surgery  ____ Take these medicines the morning of surgery with A SIP OF WATER:    1.Tramadol or Valium if needed   2.   3.   4.  5.  6.  ____ Fleet Enema (as directed)   __X__ Use CHG Soap as directed  ____ Use inhalers on the day of surgery  ____ Stop metformin 2 days prior to surgery    ____ Take 1/2 of usual insulin dose the night before surgery  and none on the morning of surgery.   ____ Stop Coumadin/Plavix/aspirin   ____ Stop Anti-inflammatories    ____ Stop supplements until after surgery.    ____ Bring C-Pap to the hospital.

## 2016-09-04 NOTE — Procedures (Signed)
R neck lymph node biopsy 18 g core times 6 No comp/EBL

## 2016-09-04 NOTE — OR Nursing (Signed)
Patient arrived in [place] after a biopsy in [place] this morning.  She stated that the doctor involved had put in an order that she could keep her hep lock in place until the morning.  She was complaining of discomfort at the site of the lock and would probably not tolerate keeping it in.  I discontinued the hep lock from the right antecubital region intact. Site was clean and dry and slightly reddened.  Patient had bandage over right neck site that had been biopsied earlier this morning, no drainage noted. Also, patient in a wheelchair as walking is difficult due to her pelvic fracture that PET scan also showed lesions all over her pelvis. Good spirits!!.

## 2016-09-05 ENCOUNTER — Encounter: Payer: Self-pay | Admitting: Internal Medicine

## 2016-09-05 ENCOUNTER — Encounter
Admission: RE | Disposition: A | Discharge status: Home / Self Care | Payer: Self-pay | Source: Ambulatory Visit | Attending: Unknown Physician Specialty

## 2016-09-05 ENCOUNTER — Ambulatory Visit: Payer: 59 | Admitting: Anesthesiology

## 2016-09-05 ENCOUNTER — Ambulatory Visit
Admission: RE | Admit: 2016-09-05 | Discharge: 2016-09-05 | Disposition: A | Discharge status: Home / Self Care | Payer: 59 | Source: Ambulatory Visit | Attending: Unknown Physician Specialty | Admitting: Unknown Physician Specialty

## 2016-09-05 DIAGNOSIS — C770 Secondary and unspecified malignant neoplasm of lymph nodes of head, face and neck: Secondary | ICD-10-CM | POA: Diagnosis not present

## 2016-09-05 DIAGNOSIS — Z79899 Other long term (current) drug therapy: Secondary | ICD-10-CM | POA: Diagnosis not present

## 2016-09-05 DIAGNOSIS — C801 Malignant (primary) neoplasm, unspecified: Secondary | ICD-10-CM | POA: Diagnosis not present

## 2016-09-05 DIAGNOSIS — R221 Localized swelling, mass and lump, neck: Secondary | ICD-10-CM | POA: Diagnosis not present

## 2016-09-05 DIAGNOSIS — R591 Generalized enlarged lymph nodes: Secondary | ICD-10-CM

## 2016-09-05 HISTORY — PX: MASS BIOPSY: SHX5445

## 2016-09-05 HISTORY — DX: Generalized enlarged lymph nodes: R59.1

## 2016-09-05 LAB — CBC
HEMATOCRIT: 38.5 % (ref 35.0–47.0)
HEMOGLOBIN: 13.6 g/dL (ref 12.0–16.0)
MCH: 30.4 pg (ref 26.0–34.0)
MCHC: 35.3 g/dL (ref 32.0–36.0)
MCV: 85.9 fL (ref 80.0–100.0)
Platelets: 262 10*3/uL (ref 150–440)
RBC: 4.49 MIL/uL (ref 3.80–5.20)
RDW: 13.2 % (ref 11.5–14.5)
WBC: 5.1 10*3/uL (ref 3.6–11.0)

## 2016-09-05 LAB — POCT I-STAT 4, (NA,K, GLUC, HGB,HCT)
Glucose, Bld: 103 mg/dL — ABNORMAL HIGH (ref 65–99)
HCT: 39 % (ref 36.0–46.0)
Hemoglobin: 13.3 g/dL (ref 12.0–15.0)
Potassium: 3.2 mmol/L — ABNORMAL LOW (ref 3.5–5.1)
SODIUM: 141 mmol/L (ref 135–145)

## 2016-09-05 LAB — POTASSIUM: POTASSIUM: 3.1 mmol/L — AB (ref 3.5–5.1)

## 2016-09-05 LAB — PROTIME-INR
INR: 0.92
Prothrombin Time: 12.3 seconds (ref 11.4–15.2)

## 2016-09-05 LAB — APTT: APTT: 26 s (ref 24–36)

## 2016-09-05 SURGERY — BIOPSY, MASS, NECK
Anesthesia: General

## 2016-09-05 MED ORDER — MIDAZOLAM HCL 2 MG/2ML IJ SOLN
INTRAMUSCULAR | Status: DC | PRN
Start: 2016-09-05 — End: 2016-09-05
  Administered 2016-09-05: 2 mg via INTRAVENOUS

## 2016-09-05 MED ORDER — SUCCINYLCHOLINE CHLORIDE 20 MG/ML IJ SOLN
INTRAMUSCULAR | Status: DC | PRN
  Administered 2016-09-05: 80 mg via INTRAVENOUS

## 2016-09-05 MED ORDER — DEXAMETHASONE SODIUM PHOSPHATE 10 MG/ML IJ SOLN
INTRAMUSCULAR | Status: DC | PRN
  Administered 2016-09-05: 10 mg via INTRAVENOUS

## 2016-09-05 MED ORDER — FAMOTIDINE 20 MG PO TABS
20.0000 mg | ORAL_TABLET | Freq: Once | ORAL | Status: AC
  Administered 2016-09-05: 20 mg via ORAL

## 2016-09-05 MED ORDER — FAMOTIDINE 20 MG PO TABS: 20.0000 mg | ORAL_TABLET | Freq: Once | ORAL | Status: DC

## 2016-09-05 MED ORDER — LIDOCAINE-EPINEPHRINE 1 %-1:100000 IJ SOLN
INTRAMUSCULAR | Status: DC | PRN
  Administered 2016-09-05: 2 mL

## 2016-09-05 MED ORDER — EPHEDRINE SULFATE 50 MG/ML IJ SOLN
INTRAMUSCULAR | Status: DC | PRN
  Administered 2016-09-05: 5 mg via INTRAVENOUS
  Administered 2016-09-05 (×2): 10 mg via INTRAVENOUS

## 2016-09-05 MED ORDER — OXYCODONE-ACETAMINOPHEN 5-325 MG PO TABS: 1.0000 | ORAL_TABLET | ORAL | 0 refills | Status: DC | PRN

## 2016-09-05 MED ORDER — PHENYLEPHRINE HCL 10 MG/ML IJ SOLN
INTRAMUSCULAR | Status: DC | PRN
  Administered 2016-09-05 (×2): 200 ug via INTRAVENOUS
  Administered 2016-09-05 (×2): 100 ug via INTRAVENOUS

## 2016-09-05 MED ORDER — FAMOTIDINE 20 MG PO TABS
ORAL_TABLET | ORAL | Status: AC
  Administered 2016-09-05: 20 mg via ORAL
  Filled 2016-09-05: qty 1

## 2016-09-05 MED ORDER — LIDOCAINE-EPINEPHRINE 1 %-1:100000 IJ SOLN
INTRAMUSCULAR | Status: AC
  Filled 2016-09-05: qty 1

## 2016-09-05 MED ORDER — LACTATED RINGERS IV SOLN
INTRAVENOUS | Status: DC
  Administered 2016-09-05 (×2): via INTRAVENOUS

## 2016-09-05 MED ORDER — FENTANYL CITRATE (PF) 100 MCG/2ML IJ SOLN
INTRAMUSCULAR | Status: DC | PRN
  Administered 2016-09-05 (×2): 50 ug via INTRAVENOUS

## 2016-09-05 MED ORDER — OXYCODONE HCL 5 MG PO TABS: 5.0000 mg | ORAL_TABLET | Freq: Once | ORAL | Status: DC | PRN

## 2016-09-05 MED ORDER — LIDOCAINE HCL (CARDIAC) 20 MG/ML IV SOLN
INTRAVENOUS | Status: DC | PRN
  Administered 2016-09-05: 80 mg via INTRAVENOUS

## 2016-09-05 MED ORDER — FENTANYL CITRATE (PF) 100 MCG/2ML IJ SOLN: 25.0000 ug | INTRAMUSCULAR | Status: DC | PRN

## 2016-09-05 MED ORDER — PROPOFOL 10 MG/ML IV BOLUS
INTRAVENOUS | Status: DC | PRN
  Administered 2016-09-05: 150 mg via INTRAVENOUS

## 2016-09-05 MED ORDER — GLYCOPYRROLATE 0.2 MG/ML IJ SOLN
INTRAMUSCULAR | Status: DC | PRN
  Administered 2016-09-05: 0.2 mg via INTRAVENOUS

## 2016-09-05 MED ORDER — MEPERIDINE HCL 25 MG/ML IJ SOLN: 6.2500 mg | INTRAMUSCULAR | Status: DC | PRN

## 2016-09-05 MED ORDER — OXYCODONE HCL 5 MG/5ML PO SOLN: 5.0000 mg | Freq: Once | ORAL | Status: DC | PRN

## 2016-09-05 MED ORDER — PROMETHAZINE HCL 25 MG/ML IJ SOLN: 6.2500 mg | INTRAMUSCULAR | Status: DC | PRN

## 2016-09-05 SURGICAL SUPPLY — 29 items
BLADE SURG 15 STRL LF DISP TIS (BLADE) ×1 IMPLANT
BLADE SURG 15 STRL SS (BLADE) ×2
CANISTER SUCT 1200ML W/VALVE (MISCELLANEOUS) ×3 IMPLANT
CORD BIP STRL DISP 12FT (MISCELLANEOUS) ×3 IMPLANT
DRAPE MAG INST 16X20 L/F (DRAPES) IMPLANT
DRSG TELFA 3X8 NADH (GAUZE/BANDAGES/DRESSINGS) ×3 IMPLANT
ELECT CAUTERY BLADE TIP 2.5 (TIP)
ELECT REM PT RETURN 9FT ADLT (ELECTROSURGICAL) ×3
ELECTRODE CAUTERY BLDE TIP 2.5 (TIP) IMPLANT
ELECTRODE REM PT RTRN 9FT ADLT (ELECTROSURGICAL) ×1 IMPLANT
FORCEPS JEWEL BIP 4-3/4 STR (INSTRUMENTS) ×3 IMPLANT
GLOVE BIO SURGEON STRL SZ7.5 (GLOVE) ×3 IMPLANT
GLOVE PROTEXIS LATEX SZ 7.5 (GLOVE) ×3 IMPLANT
GOWN STRL REUS W/ TWL LRG LVL3 (GOWN DISPOSABLE) ×2 IMPLANT
GOWN STRL REUS W/TWL LRG LVL3 (GOWN DISPOSABLE) ×4
HARMONIC SCALPEL FOCUS (MISCELLANEOUS) IMPLANT
HOOK STAY BLUNT/RETRACTOR 5M (MISCELLANEOUS) IMPLANT
LABEL OR SOLS (LABEL) IMPLANT
LIQUID BAND (GAUZE/BANDAGES/DRESSINGS) ×3 IMPLANT
NS IRRIG 500ML POUR BTL (IV SOLUTION) ×3 IMPLANT
PACK HEAD/NECK (MISCELLANEOUS) ×3 IMPLANT
PROBE MONO 100X0.75 ELECT 1.9M (MISCELLANEOUS) IMPLANT
SPONGE KITTNER 5P (MISCELLANEOUS) ×3 IMPLANT
STAPLER SKIN PROX 35W (STAPLE) ×3 IMPLANT
SUCTION FRAZIER HANDLE 10FR (MISCELLANEOUS) ×2
SUCTION TUBE FRAZIER 10FR DISP (MISCELLANEOUS) ×1 IMPLANT
SUT SILK 2 0 (SUTURE) ×4
SUT SILK 2-0 18XBRD TIE 12 (SUTURE) ×2 IMPLANT
SUT VIC AB 4-0 RB1 18 (SUTURE) ×6 IMPLANT

## 2016-09-05 NOTE — Transfer of Care (Signed)
Immediate Anesthesia Transfer of Care Note  Patient: Sylvia Jones  Procedure(s) Performed: Procedure(s): NECK MASS BIOPSY (N/A)  Patient Location: PACU  Anesthesia Type:General  Level of Consciousness: sedated  Airway & Oxygen Therapy: Patient Spontanous Breathing and Patient connected to face mask oxygen  Post-op Assessment: Report given to RN and Post -op Vital signs reviewed and stable  Post vital signs: Reviewed and stable  Last Vitals:  Vitals:   09/05/16 1119 09/05/16 1123  BP: 127/76   Pulse: 91 86  Resp: 13 14  Temp: 0000000 C     Complications: No apparent anesthesia complications

## 2016-09-05 NOTE — Discharge Instructions (Signed)

## 2016-09-05 NOTE — Anesthesia Procedure Notes (Signed)
Procedure Name: Intubation Date/Time: 09/05/2016 9:53 AM Performed by: Doreen Salvage Pre-anesthesia Checklist: Patient identified, Patient being monitored, Timeout performed, Emergency Drugs available and Suction available Patient Re-evaluated:Patient Re-evaluated prior to inductionOxygen Delivery Method: Circle system utilized Preoxygenation: Pre-oxygenation with 100% oxygen Intubation Type: IV induction Ventilation: Mask ventilation without difficulty Laryngoscope Size: Mac and 3 Grade View: Grade I Tube type: Oral Tube size: 7.0 mm Number of attempts: 1 Airway Equipment and Method: Stylet Placement Confirmation: ETT inserted through vocal cords under direct vision,  positive ETCO2 and breath sounds checked- equal and bilateral Secured at: 22 cm Tube secured with: Tape Dental Injury: Teeth and Oropharynx as per pre-operative assessment

## 2016-09-05 NOTE — H&P (Signed)
  H+P  Reviewed and will be scanned in later. No changes noted. 

## 2016-09-05 NOTE — Anesthesia Postprocedure Evaluation (Signed)
Anesthesia Post Note  Patient: Sylvia Jones  Procedure(s) Performed: Procedure(s) (LRB): NECK MASS BIOPSY (N/A)  Patient location during evaluation: PACU Anesthesia Type: General Level of consciousness: awake and alert and oriented Pain management: pain level controlled Vital Signs Assessment: post-procedure vital signs reviewed and stable Respiratory status: spontaneous breathing, nonlabored ventilation and respiratory function stable Cardiovascular status: blood pressure returned to baseline and stable Postop Assessment: no signs of nausea or vomiting Anesthetic complications: no    Last Vitals:  Vitals:   09/05/16 1135 09/05/16 1150  BP: (!) 149/87 (!) 150/93  Pulse: 97 96  Resp: 19 18  Temp:      Last Pain:  Vitals:   09/05/16 1150  TempSrc:   PainSc: 2                  Elener Custodio

## 2016-09-05 NOTE — Op Note (Signed)
09/05/2016  11:13 AM    Steers, Neoma Laming  [id number]   Pre-Op Dx: NECK MASS  Post-op Dx: SAME  Proc: Excision of right deep supraclavicular lymph node   Surg:  Beverly Gust T  Anes:  GOT  EBL:  Less than 5 cc  Comp:  None  Findings:  Approximately 1.5 x 2.5 cm deep right supraclavicular node  Procedure: Britini was identified in the holding area taken the operating room placed in supine position. After general endotracheal anesthesia the neck was prepped and draped sterilely. A palpable mass was identified in the right supraclavicular area. A local anesthetic 1% lidocaine with 1 100,000 insulin epinephrine was used to inject over the mass a total of 2 cc was used. A 15 blade was then used to incise down to and through the platysma muscle. Hemostasis achieved using the bipolar cautery. Once dissection proceeded through the platysma the mass could be palpated. Blunt dissection through the soft tissue identified a small portion sternocleidomastoid muscle which was divided using the bipolar cautery. Immediately beneath the sternum cleidomastoid the mass could be identified using the microbipolar careful dissection was used to remove the mass from surrounding tissue. There were several surrounding vessels however these were not injured and remained intact. The mass was then removed and sent to pathology and was deemed by pathology as an adequate specimen. Therefore the wound was reexamined there was no other mass palpable wound was irrigated with saline. The platysma and deep layers were closed using 4-0 Vicryl the subcutaneous taste tissues were closed using 4-0 Vicryl and the skin was closed using Dermabond. The patient was in return anesthesia where she was extubated in the operating room taken recovery room stable condition.  Cultures: None Specimens: Right deep supraclavicular lymph node   Dispo:   Good  Plan:  Discharged home follow-up 1 week  Cornie Herrington  T  09/05/2016 11:13 AM

## 2016-09-05 NOTE — Anesthesia Preprocedure Evaluation (Signed)
Anesthesia Evaluation  Patient identified by MRN, date of birth, ID band Patient awake    Reviewed: Allergy & Precautions, NPO status , Patient's Chart, lab work & pertinent test results  History of Anesthesia Complications Negative for: history of anesthetic complications  Airway Mallampati: II  TM Distance: >3 FB Neck ROM: Full    Dental no notable dental hx.    Pulmonary neg pulmonary ROS, neg sleep apnea, neg COPD,    breath sounds clear to auscultation- rhonchi (-) wheezing      Cardiovascular Exercise Tolerance: Good (-) hypertension(-) angina(-) CAD and (-) Past MI  Rhythm:Regular Rate:Normal - Systolic murmurs and - Diastolic murmurs    Neuro/Psych negative neurological ROS  negative psych ROS   GI/Hepatic negative GI ROS, Neg liver ROS,   Endo/Other  negative endocrine ROSneg diabetes  Renal/GU negative Renal ROS     Musculoskeletal  (+) Arthritis , Osteoarthritis,    Abdominal (+) - obese,   Peds  Hematology negative hematology ROS (+)   Anesthesia Other Findings Past Medical History: No date: Arthritis No date: Cancer [facility])     Comment: lymph nodes, in process of diagnosing No date: Cervical disc disease 2017: Fracture     Comment: left pubis bone-atypical No date: Lymphadenopathy No date: Neuromuscular disorder (HCC)     Comment: compressed disc in neck when 32 or 60 years               old No date: Tubular adenoma   Reproductive/Obstetrics                             Anesthesia Physical Anesthesia Plan  ASA: II  Anesthesia Plan: General   Post-op Pain Management:    Induction: Intravenous  Airway Management Planned: Oral ETT  Additional Equipment:   Intra-op Plan:   Post-operative Plan: Extubation in OR  Informed Consent: I have reviewed the patients History and Physical, chart, labs and discussed the procedure including the risks, benefits and  alternatives for the proposed anesthesia with the patient or authorized representative who has indicated his/her understanding and acceptance.   Dental advisory given  Plan Discussed with: Anesthesiologist and CRNA  Anesthesia Plan Comments:         Anesthesia Quick Evaluation

## 2016-09-06 ENCOUNTER — Inpatient Hospital Stay: Payer: 59 | Admitting: Internal Medicine

## 2016-09-07 ENCOUNTER — Inpatient Hospital Stay: Payer: 59

## 2016-09-07 ENCOUNTER — Inpatient Hospital Stay: Payer: 59 | Attending: Internal Medicine | Admitting: Internal Medicine

## 2016-09-07 ENCOUNTER — Other Ambulatory Visit: Payer: Self-pay | Admitting: Internal Medicine

## 2016-09-07 ENCOUNTER — Telehealth: Payer: Self-pay | Admitting: Internal Medicine

## 2016-09-07 ENCOUNTER — Other Ambulatory Visit: Payer: Self-pay | Admitting: *Deleted

## 2016-09-07 VITALS — BP 148/93 | HR 107 | Temp 97.8°F | Resp 18 | Ht 65.0 in | Wt 125.0 lb

## 2016-09-07 DIAGNOSIS — K5900 Constipation, unspecified: Secondary | ICD-10-CM | POA: Diagnosis not present

## 2016-09-07 DIAGNOSIS — Z5111 Encounter for antineoplastic chemotherapy: Secondary | ICD-10-CM | POA: Diagnosis not present

## 2016-09-07 DIAGNOSIS — Z79899 Other long term (current) drug therapy: Secondary | ICD-10-CM | POA: Insufficient documentation

## 2016-09-07 DIAGNOSIS — C50919 Malignant neoplasm of unspecified site of unspecified female breast: Secondary | ICD-10-CM

## 2016-09-07 DIAGNOSIS — C770 Secondary and unspecified malignant neoplasm of lymph nodes of head, face and neck: Secondary | ICD-10-CM | POA: Diagnosis not present

## 2016-09-07 DIAGNOSIS — Z7689 Persons encountering health services in other specified circumstances: Secondary | ICD-10-CM | POA: Diagnosis not present

## 2016-09-07 DIAGNOSIS — T451X5A Adverse effect of antineoplastic and immunosuppressive drugs, initial encounter: Secondary | ICD-10-CM

## 2016-09-07 DIAGNOSIS — C7951 Secondary malignant neoplasm of bone: Secondary | ICD-10-CM | POA: Diagnosis not present

## 2016-09-07 DIAGNOSIS — M1990 Unspecified osteoarthritis, unspecified site: Secondary | ICD-10-CM | POA: Insufficient documentation

## 2016-09-07 DIAGNOSIS — C801 Malignant (primary) neoplasm, unspecified: Secondary | ICD-10-CM

## 2016-09-07 DIAGNOSIS — R110 Nausea: Secondary | ICD-10-CM

## 2016-09-07 LAB — COMPREHENSIVE METABOLIC PANEL
ALBUMIN: 4.2 g/dL (ref 3.5–5.0)
ALT: 104 U/L — ABNORMAL HIGH (ref 14–54)
ANION GAP: 14 (ref 5–15)
AST: 89 U/L — ABNORMAL HIGH (ref 15–41)
Alkaline Phosphatase: 131 U/L — ABNORMAL HIGH (ref 38–126)
BILIRUBIN TOTAL: 1.1 mg/dL (ref 0.3–1.2)
BUN: 8 mg/dL (ref 6–20)
CO2: 25 mmol/L (ref 22–32)
Calcium: 9.3 mg/dL (ref 8.9–10.3)
Chloride: 98 mmol/L — ABNORMAL LOW (ref 101–111)
Creatinine, Ser: 0.43 mg/dL — ABNORMAL LOW (ref 0.44–1.00)
GFR calc Af Amer: >60 mL/min (ref >60)
GFR calc non Af Amer: >60 mL/min (ref >60)
GLUCOSE: 109 mg/dL — AB (ref 65–99)
POTASSIUM: 3 mmol/L — AB (ref 3.5–5.1)
Sodium: 137 mmol/L (ref 135–145)
TOTAL PROTEIN: 7.4 g/dL (ref 6.5–8.1)

## 2016-09-07 LAB — SURGICAL PATHOLOGY

## 2016-09-07 MED ORDER — PROCHLORPERAZINE MALEATE 10 MG PO TABS: 10.0000 mg | ORAL_TABLET | Freq: Four times a day (QID) | ORAL | 1 refills | Status: DC | PRN

## 2016-09-07 MED ORDER — ONDANSETRON HCL 8 MG PO TABS: 8.0000 mg | ORAL_TABLET | Freq: Two times a day (BID) | ORAL | 1 refills | Status: DC | PRN

## 2016-09-07 NOTE — Assessment & Plan Note (Addendum)
#  Status post excisional lymph node biopsy- positive for carcinoma- question breast primary [based on immunohistochemistry]; no breast masses noted on imaging/or on exam; check breast panel; MMR; [employer] One- PDL-1; cancer type ID  #Given the rapid progression of the disease in the neck- I would recommend starting chemotherapy with carbo-taxol every 3 weeks.  Discussed the potential side effects including but not limited to-increasing fatigue, nausea vomiting, diarrhea, hair loss, sores in the mouth, increase risk of infection and also neuropathy.   # Growth factor-Neulasta/On pro would be given as prophylaxis for chemotherapy-induced neutropenia to prevent febrile neutropenias. Discussed potential side effect- myalgias/arthralgias- recommend Claritin for 4 days.   # Skeletal metastases- recommend X-geva . Recommend Xgeva every 6 weeks-to help with pain/avoid pathologic fractures. Discussed the potential side effects including osteonecrosis of the jaw/hypocalcemia. Recommend calcium and vitamin D.  # Recommend checking MRI of the brain. Checking MRI of the breast. We will need a port prior to next cycle. Prescription for antiemetics given.  # Follow-up in approximately 1 week/labs/ IV fluids possible/X-geva.

## 2016-09-07 NOTE — Progress Notes (Signed)
Patient here for follow-up. - acute add on to discuss pathology and discuss chemotherapy. Patient is very anxious today to discuss results.

## 2016-09-07 NOTE — Progress Notes (Signed)
[facility] NOTE  Patient Care Team: Rusty Aus, MD as PCP - General (Internal Medicine)  CHIEF COMPLAINTS/PURPOSE OF CONSULTATION:   Oncology History   # SEP 2017- LYMPHADENOPATHY- extensive- abdominal/RP/ bil neck LN R>L. [place] Bx- POSITIVE for MALIGNANCY; PET- extensive adenopathy  # OCT 2017- ADENO CA/ Ca of Unknown Primary- [ IHC- s/o Breast primary; ca-27-29/ca-125/ca19-9-Elevated; CEA-Normal];  # OCT 13th- CARBO-TAXOL  # Bone mets- X-geva q 6 W  # Lung RUL nodule [benign s/p resection; 2009- Dr.Oaks]  # spring 2017- colonoscopy [Dr.Elliot; polyps]; June mammo-NEG     Primary cancer of unknown site [facility])   08/25/2016 Initial Diagnosis    Primary cancer of unknown site [facility])       Oncology History   # SEP 2017- LYMPHADENOPATHY- extensive- abdominal/RP/ bil neck LN R>L. [place] Bx- POSITIVE for MALIGNANCY; PET- extensive adenopathy  # OCT 2017- ADENO CA/ Ca of Unknown Primary- [ IHC- s/o Breast primary; ca-27-29/ca-125/ca19-9-Elevated; CEA-Normal];  # OCT 13th- CARBO-TAXOL  # Bone mets- X-geva q 6 W  # Lung RUL nodule [benign s/p resection; 2009- Dr.Oaks]  # spring 2017- colonoscopy [Dr.Elliot; polyps]; June mammo-NEG     Primary cancer of unknown site [facility])   08/25/2016 Initial Diagnosis    Primary cancer of unknown site [facility])       HISTORY OF PRESENTING ILLNESS:  Sylvia Jones 60 y.o.  female  multiple small bilateral neck adenopathy/ retroperitoneal abdominal adenopathy/bone lesions- is currently Status post repeat excisional biopsy in the right neck lymph node is significantly with the results/ next plan  Of care.  She continues to notice swelling on the left side of the neck; also noticed to have increasing generalized abdominal pain-complains of constipation.. No nausea no vomiting. Poor appetite.   No tingling and numbness. Mild nausea no vomiting. Feels poorly.   ROS: A complete 10 point review of system is done which is  negative except mentioned above in history of present illness  MEDICAL HISTORY:  Past Medical History:  Diagnosis Date  . Arthritis   . Cancer ([place])    lymph nodes, in process of diagnosing  . Cervical disc disease   . Fracture 2017   left pubis bone-atypical  . Lymphadenopathy   . Neuromuscular disorder ([place])    compressed disc in neck when 34 or 60 years old  . Tubular adenoma     SURGICAL HISTORY: Past Surgical History:  Procedure Laterality Date  . APPENDECTOMY  1986  . BREAST BIOPSY Right    at age 84  . CESAREAN SECTION  1988  . COLONOSCOPY WITH PROPOFOL N/A 07/12/2015   Procedure: COLONOSCOPY WITH PROPOFOL;  Surgeon: Manya Silvas, MD;  Location: [facility] ENDOSCOPY;  Service: Endoscopy;  Laterality: N/A;  . EXPLORATORY LAPAROTOMY  1986  . MASS BIOPSY N/A 09/05/2016   Procedure: NECK MASS BIOPSY;  Surgeon: Beverly Gust, MD;  Location: [facility] ORS;  Service: ENT;  Laterality: N/A;  . THORACOTOMY Right 03/2008   RLL Wedge Resection- Dr. 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 Marital status: Married    Spouse name: N/A  . Number of children: N/A  . Years of education: N/A   Occupational History  . Not on file.   Social History Main Topics  . Smoking status: Never Smoker  . Smokeless tobacco: Never Used  . Alcohol use No  . Drug use:  No  . Sexual activity: Yes   Other Topics Concern  . Not on file   Social History Narrative  . No narrative on file    FAMILY HISTORY: father- ? Cancer died 52. .. Brother- kasposi sarcoma Family History  Problem Relation Age of Onset  . Cancer Father   . Heart attack Father   . Hypertension Brother   . Diabetes Maternal Grandmother   . Diabetes Paternal Grandmother   . Cancer Brother     ALLERGIES:  is allergic to sulfa antibiotics.  MEDICATIONS:  Current Outpatient Prescriptions  Medication Sig Dispense  Refill  . docusate sodium (COLACE) 100 MG capsule Take 100 mg by mouth.    Marland Kitchen acetaminophen (TYLENOL) 325 MG tablet Take 650 mg by mouth every 6 (six) hours as needed.    . cyclobenzaprine (FLEXERIL) 10 MG tablet Take 5 mg by mouth 3 (three) times daily as needed for muscle spasms.     . Multiple Vitamin (MULTIVITAMIN) tablet Take 1 tablet by mouth daily.    . naproxen sodium (ANAPROX) 220 MG tablet Take 220 mg by mouth daily as needed.     . ondansetron (ZOFRAN) 8 MG tablet Take 1 tablet (8 mg total) by mouth 2 (two) times daily as needed for refractory nausea / vomiting. Start on day 3 after chemo. (Patient not taking: Reported on 09/07/2016) 30 tablet 1  . oxyCODONE-acetaminophen (ROXICET) 5-325 MG tablet Take 1-2 tablets by mouth every 4 (four) hours as needed for severe pain. (Patient not taking: Reported on 09/07/2016) 40 tablet 0  . potassium chloride SA (K-DUR,KLOR-CON) 20 MEQ tablet Take 1 tablet (20 mEq total) by mouth 2 (two) times daily. X 14 days 28 tablet 0  . prochlorperazine (COMPAZINE) 10 MG tablet Take 1 tablet (10 mg total) by mouth every 6 (six) hours as needed (Nausea or vomiting). (Patient not taking: Reported on 09/07/2016) 30 tablet 1  . traMADol (ULTRAM) 50 MG tablet Take 50 mg by mouth every 6 (six) hours as needed.      No current facility-administered medications for this visit.       Marland Kitchen  PHYSICAL EXAMINATION: ECOG PERFORMANCE STATUS: 1 - Symptomatic but completely ambulatory  Vitals:   09/07/16 1602  BP: (!) 148/93  Pulse: (!) 107  Resp: 18  Temp: 97.8 F (36.6 C)   Filed Weights   09/07/16 1602  Weight: 125 lb (56.7 kg)    GENERAL: Well-nourished well-developed; Alert, no distress and comfortable.   With husband/son. Patient walking with pain EYES: no pallor or icterus OROPHARYNX: no thrush or ulceration; good dentition  NECK: supple, no masses felt LYMPH:  Matted lymphadenopathy noted in the left side of the neck; and also lymphadenopathy noted on  the right neck posterior neck LUNGS: clear to auscultation and  No wheeze or crackles HEART/CVS: regular rate & rhythm and no murmurs; No lower extremity edema ABDOMEN: abdomen soft, non-tender and normal bowel sounds Musculoskeletal:no cyanosis of digits and no clubbing  PSYCH: alert & oriented x 3 with fluent speech NEURO: no focal motor/sensory deficits SKIN:  no rashes or significant lesions   LABORATORY DATA:  I have reviewed the data as listed Lab Results  Component Value Date   WBC 5.1 09/05/2016   HGB 13.3 09/05/2016   HCT 39.0 09/05/2016   MCV 85.9 09/05/2016   PLT 262 09/05/2016    Recent Labs  08/11/16 1030 08/13/16 1411 08/25/16 1632 09/05/16 0855 09/05/16 0902 09/07/16 1512  NA 122* 136 138  --  141 137  K 3.2* 3.4* 3.2* 3.1* 3.2* 3.0*  CL 85* 96* 103  --   --  98*  CO2 23 33* 27  --   --  25  GLUCOSE 83 112* 99  --  103* 109*  BUN 12 7 7   --   --  8  CREATININE 0.46 0.45 0.40*  --   --  0.43*  CALCIUM 10.5* 9.7 9.5  --   --  9.3  GFRNONAA >60 >60 >60  --   --  >60  GFRAA >60 >60 >60  --   --  >60  PROT 8.1  --  8.0  --   --  7.4  ALBUMIN 4.9  --  4.5  --   --  4.2  AST 49*  --  34  --   --  89*  ALT 41  --  23  --   --  104*  ALKPHOS 121  --  104  --   --  131*  BILITOT 1.5*  --  0.4  --   --  1.1    RADIOGRAPHIC STUDIES: I have personally reviewed the radiological images as listed and agreed with the findings in the report. Dg Cervical Spine Complete  Result Date: 08/13/2016 CLINICAL DATA:  Patient with known pelvic fracture. Osseous metastasis. Upper back pain after bending over. EXAM: CERVICAL SPINE - COMPLETE 4+ VIEW COMPARISON:  CT CAP 08/07/2016; cervical spine MRI 10/31/2010 FINDINGS: Normal anatomic alignment. No evidence for acute fracture or dislocation. Lateral masses articulate appropriately with the dens. Lung apices are clear. Prevertebral soft tissues are unremarkable. Osseous sclerosis involving the medial first rib posteriorly.  IMPRESSION: No acute osseous abnormality. Osseous sclerosis involving the posterior medial left first rib concerning for metastasis. Electronically Signed   By: Lovey Newcomer M.D.   On: 08/13/2016 14:54   Dg Thoracic Spine 2 View  Result Date: 08/13/2016 CLINICAL DATA:  Patient with pain in the upper thoracic spine after bending over. Initial encounter. History of osseous metastasis. EXAM: THORACIC SPINE 2 VIEWS COMPARISON:  CT CAP 08/07/2016 FINDINGS: Normal anatomic alignment. No evidence for acute fracture dislocation. Re- demonstrated oval lucency within the anterior aspect of the T2 vertebral body, most compatible with osseous metastasis. Additionally there is patchy sclerosis involving the posterior medial left first rib. IMPRESSION: No acute osseous abnormality. 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 Electronically Signed   By: Lovey Newcomer M.D.   On: 08/13/2016 14:59   Ct Soft Tissue Neck W Contrast  Result Date: 08/11/2016 CLINICAL DATA:  Generalized lymphadenopathy including in the neck, abdomen, and pelvis. EXAM: CT NECK WITH CONTRAST TECHNIQUE: Multidetector CT imaging of the neck was performed using the standard protocol following the bolus administration of intravenous contrast. CONTRAST:  61m ISOVUE-300 IOPAMIDOL (ISOVUE-300) INJECTION 61% COMPARISON:  Chest CT 08/07/2016. FINDINGS: Pharynx and larynx: No pharyngeal mass or parapharyngeal inflammatory change. Unremarkable larynx. Salivary glands: Submandibular and parotid glands are unremarkable. Thyroid: Unremarkable. Lymph nodes: There is an increased number of relatively small but abnormal lymph nodes throughout the anterior and posterior cervical stations bilaterally. Level II lymph nodes measure up to 9 mm in short axis bilaterally. Level V lymph nodes measure up to 8 mm in short axis on the right. Larger lymph nodes are partially rounded in configuration with heterogeneous enhancement.  Subcentimeter supraclavicular lymph nodes are present bilaterally. Vascular: Major vascular structures in the neck appear patent. Limited intracranial: Unremarkable. Visualized orbits: Unremarkable. Mastoids  and visualized paranasal sinuses: Clear. Skeleton: C5 vertebral body hemangioma. 1.1 cm lytic lesion in the T2 vertebral body and irregular sclerosis in the left first rib as described on recent chest CT. Upper chest: Mild biapical pleural-parenchymal scarring. No enlarged lymph nodes in the superior mediastinum. Other: None. IMPRESSION: 1. Bilateral cervical lymphadenopathy concerning for malignancy. 2. T2 vertebral body lesion suspicious for metastasis. Electronically Signed   By: Logan Bores M.D.   On: 08/11/2016 17:15   Nm Pet Image Initial (pi) Skull Base To Thigh  Result Date: 08/29/2016 CLINICAL DATA:  Initial treatment strategy for primary cancer of unknown site. EXAM: NUCLEAR MEDICINE PET SKULL BASE TO THIGH TECHNIQUE: 13.28 mCi F-18 FDG was injected intravenously. Full-ring PET imaging was performed from the skull base to thigh after the radiotracer. CT data was obtained and used for attenuation correction and anatomic localization. FASTING BLOOD GLUCOSE:  Value: 88 mg/dl COMPARISON:  CT eat of the chest status pelvis from 08/07/2016. FINDINGS: NECK Hypermetabolic bilateral cervical lymph nodes are identified. Index left level 2 lymph node measures 8 mm and has an SUV max equal to 8.8. Right level 2 lymph measures 7 mm and has an SUV max equal to 7.7, image 24 of series 3. CHEST No hypermetabolic mediastinal or hilar nodes. No suspicious pulmonary nodules on the CT scan. Increased radiotracer uptake within the distal esophagus noted. SUV max equals 4.9. Calcified granuloma is identified within the right lower lobe. There is a small left pleural effusion noted. No hypermetabolic pulmonary nodules identified ABDOMEN/PELVIS No abnormal hypermetabolic activity within the liver, pancreas, adrenal  glands, or spleen. Extensive hypermetabolic upper abdominal adenopathy identified. Index gastrohepatic ligament node measures 1 cm and has an SUV max equal to 4.8. Portahepatic node measures 1.6 cm and has an SUV max equal to 12.5. Hypermetabolic periaortic node measures 1 cm and has an SUV max equal to 9.14. Numerous hypermetabolic mesenteric nodes identified. Index node with in the jejunal mesenteric measures 1 cm and has an SUV max equal to 6.6. Hypermetabolic left common iliac and left external iliac nodes identified. Left external iliac node measures 1.5 cm and has an SUV max equal to 9.1. SKELETON Multifocal hypermetabolic lytic bone metastases are identified. Index lytic lesion involving the posterior elements of C7 has an SUV max equal to 7.5, image 45 of series 3. Hypermetabolic lytic lesion involving the posterior aspect of the left first rib has an SUV max equal to 6.28, image 52 of series 3. Lytic lesion involving the T3 vertebra has an SUV max equal to 5.17. Destructive bone lesion involving the left pubic bone with associated pathologic fracture is identified, image 232 of series 3. SUV max is equal to 10.9. IMPRESSION: 1. Examination is positive for hypermetabolic metastatic adenopathy within the neck, abdomen and pelvis. 2. Multifocal hypermetabolic lytic bone metastases including lesion involving the T3 vertebra. 3. Nonspecific increased radiotracer uptake localizes to the distal aspect of the esophagus. Electronically Signed   By: Kerby Moors M.D.   On: 08/29/2016 14:38   US Biopsy  Result Date: 09/04/2016 INDICATION: Abnormal cervical lymphadenopathy EXAM: ULTRASOUND-GUIDED BIOPSY OF A RIGHT CERVICAL LYMPH NODE.  CORE. MEDICATIONS: None. ANESTHESIA/SEDATION: Fentanyl 100 mcg IV; Versed 2 mg IV Moderate Sedation Time:  35 The patient was continuously monitored during the procedure by the interventional radiology nurse under my direct supervision. FLUOROSCOPY TIME:  Fluoroscopy Time:  minutes   seconds ( mGy). COMPLICATIONS: None immediate. PROCEDURE: Informed written consent was obtained from the patient after a thorough discussion  of the procedural risks, benefits and alternatives. All questions were addressed. Maximal Sterile Barrier Technique was utilized including caps, mask, sterile gowns, sterile gloves, sterile drape, hand hygiene and skin antiseptic. A timeout was performed prior to the initiation of the procedure. Initial imaging in the left supraclavicular region demonstrates only very small lymph nodes. Enlarged lymph nodes in the right neck or identified. The right neck was prepped with ChloraPrep in a sterile fashion, and a sterile drape was applied covering the operative field. A sterile gown and sterile gloves were used for the procedure. Under sonographic guidance, 6 18 gauge core biopsies of an enlarged right cervical node were obtain. Final imaging was performed. Patient tolerated the procedure well without complication. Vital sign monitoring by nursing staff during the procedure will continue as patient is in the special procedures unit for post procedure observation. FINDINGS: The images document guide needle placement within the enlarged right cervical lymph node. Post biopsy images demonstrate no hemorrhage. IMPRESSION: Successful ultrasound-guided core biopsy of an enlarged right neck lymph node. Electronically Signed   By: Marybelle Killings M.D.   On: 09/04/2016 10:45   US Biopsy  Result Date: 08/22/2016 INDICATION: Right cervical lymph node. EXAM: ULTRASOUND BIOPSY CORE LIVER MEDICATIONS: None. ANESTHESIA/SEDATION: Moderate (conscious) sedation was employed during this procedure. A total of Versed 2.0 mg and Fentanyl 50 mcg was administered intravenously. Moderate Sedation Time: 24 minutes. The patient's level of consciousness and vital signs were monitored continuously by radiology nursing throughout the procedure under my direct supervision. COMPLICATIONS: None immediate.  PROCEDURE: Informed written consent was obtained from the patient after a thorough discussion of the procedural risks, benefits and alternatives. All questions were addressed. Sterile Barrier Technique was utilized including mask, sterile gloves, sterile drape, hand hygiene and skin antiseptic. A timeout was performed prior to the initiation of the procedure. Patient was placed supine on the structure. The right neck was prepped and draped in sterile technique. Local anesthetic was applied. Under real-time ultrasound guidance, 17 gauge guiding needle was directed into right cervical lymph node. Four core samples were obtained using 18 gauge biopsy needle. These were given to pathology technologist who was present at that time. Needles were removed and appropriate dressing was applied. IMPRESSION: Under real-time ultrasound guidance, percutaneous core biopsy of right cervical lymph node was performed. Electronically Signed   By: Marijo Conception, M.D.   On: 08/22/2016 10:02    ASSESSMENT & PLAN:   Primary cancer of unknown site [facility]) # Status post excisional lymph node biopsy- positive for carcinoma- question breast primary [based on immunohistochemistry]; no breast masses noted on imaging/or on exam; check breast panel; MMR; Foundation One- PDL-1; cancer type ID  #Given the rapid progression of the disease in the neck- I would recommend starting chemotherapy with carbo-taxol every 3 weeks.  Discussed the potential side effects including but not limited to-increasing fatigue, nausea vomiting, diarrhea, hair loss, sores in the mouth, increase risk of infection and also neuropathy.   # Growth factor-Neulasta/On pro would be given as prophylaxis for chemotherapy-induced neutropenia to prevent febrile neutropenias. Discussed potential side effect- myalgias/arthralgias- recommend Claritin for 4 days.   # Skeletal metastases- recommend X-geva . Recommend Xgeva every 6 weeks-to help with pain/avoid pathologic  fractures. Discussed the potential side effects including osteonecrosis of the jaw/hypocalcemia. Recommend calcium and vitamin D.  # Recommend checking MRI of the brain. Checking MRI of the breast. We will need a port prior to next cycle. Prescription for antiemetics given.  # Follow-up in  approximately 1 week/labs/ IV fluids possible/X-geva.   # A copy of the pathology report was given; discussed regarding seeking an second opinion.   # Patient's case was also discussed at the tumor conference on October 12th.     Cammie Sickle, MD 09/10/2016 12:37 PM

## 2016-09-07 NOTE — Progress Notes (Signed)
START OFF PATHWAY REGIMEN - [Other Dx]  Carboplatin AUC=6 + Paclitaxel 200 mg/m2 q21 days  [id number]:Carboplatin AUC=6 + Paclitaxel 200 mg/m2 q21 days:   A cycle is every 21 days:     Paclitaxel (Taxol(R)) 200 mg/m2 in 500 mL NS IV over 3 hours followed by Dose Mod: None     Carboplatin (Paraplatin(R)) AUC=6 in 250 mL NS IV over 1 hour Dose Mod: None Additional Orders: * All AUC calculations intended to be used in [employer] formula  **Always confirm dose/schedule in your pharmacy ordering system**    Intent of Therapy: Non-Curative / Palliative Intent, Discussed with Patient

## 2016-09-08 ENCOUNTER — Ambulatory Visit: Payer: 59 | Admitting: Internal Medicine

## 2016-09-08 ENCOUNTER — Inpatient Hospital Stay: Payer: 59

## 2016-09-08 ENCOUNTER — Other Ambulatory Visit: Payer: Self-pay | Admitting: Internal Medicine

## 2016-09-08 ENCOUNTER — Other Ambulatory Visit: Payer: Self-pay | Admitting: *Deleted

## 2016-09-08 VITALS — BP 175/95 | HR 79 | Resp 18

## 2016-09-08 DIAGNOSIS — C801 Malignant (primary) neoplasm, unspecified: Secondary | ICD-10-CM

## 2016-09-08 DIAGNOSIS — Z79899 Other long term (current) drug therapy: Secondary | ICD-10-CM | POA: Diagnosis not present

## 2016-09-08 DIAGNOSIS — M1990 Unspecified osteoarthritis, unspecified site: Secondary | ICD-10-CM | POA: Diagnosis not present

## 2016-09-08 DIAGNOSIS — C770 Secondary and unspecified malignant neoplasm of lymph nodes of head, face and neck: Secondary | ICD-10-CM | POA: Diagnosis not present

## 2016-09-08 DIAGNOSIS — Z5111 Encounter for antineoplastic chemotherapy: Secondary | ICD-10-CM | POA: Diagnosis not present

## 2016-09-08 DIAGNOSIS — Z7689 Persons encountering health services in other specified circumstances: Secondary | ICD-10-CM | POA: Diagnosis not present

## 2016-09-08 DIAGNOSIS — K5900 Constipation, unspecified: Secondary | ICD-10-CM | POA: Diagnosis not present

## 2016-09-08 DIAGNOSIS — R110 Nausea: Secondary | ICD-10-CM

## 2016-09-08 DIAGNOSIS — C7951 Secondary malignant neoplasm of bone: Secondary | ICD-10-CM | POA: Diagnosis not present

## 2016-09-08 DIAGNOSIS — T451X5A Adverse effect of antineoplastic and immunosuppressive drugs, initial encounter: Principal | ICD-10-CM

## 2016-09-08 MED ORDER — SODIUM CHLORIDE 0.9 % IV SOLN
20.0000 mg | Freq: Once | INTRAVENOUS | Status: AC
  Administered 2016-09-08: 20 mg via INTRAVENOUS
  Filled 2016-09-08: qty 2

## 2016-09-08 MED ORDER — SODIUM CHLORIDE 0.9 % IV SOLN
556.8000 mg | Freq: Once | INTRAVENOUS | Status: AC
  Administered 2016-09-08: 560 mg via INTRAVENOUS
  Filled 2016-09-08: qty 56

## 2016-09-08 MED ORDER — PEGFILGRASTIM 6 MG/0.6ML ~~LOC~~ PSKT
6.0000 mg | PREFILLED_SYRINGE | Freq: Once | SUBCUTANEOUS | Status: AC
  Administered 2016-09-08: 6 mg via SUBCUTANEOUS
  Filled 2016-09-08: qty 0.6

## 2016-09-08 MED ORDER — PACLITAXEL CHEMO INJECTION 300 MG/50ML: 200.0000 mg/m2 | Freq: Once | INTRAVENOUS | Status: DC

## 2016-09-08 MED ORDER — PALONOSETRON HCL INJECTION 0.25 MG/5ML
0.2500 mg | Freq: Once | INTRAVENOUS | Status: AC
  Administered 2016-09-08: 0.25 mg via INTRAVENOUS
  Filled 2016-09-08: qty 5

## 2016-09-08 MED ORDER — SODIUM CHLORIDE 0.9 % IV SOLN
Freq: Once | INTRAVENOUS | Status: AC
  Administered 2016-09-08: 10:00:00 via INTRAVENOUS
  Filled 2016-09-08: qty 1000

## 2016-09-08 MED ORDER — DIPHENHYDRAMINE HCL 50 MG/ML IJ SOLN
50.0000 mg | Freq: Once | INTRAMUSCULAR | Status: AC
  Administered 2016-09-08: 50 mg via INTRAVENOUS
  Filled 2016-09-08: qty 1

## 2016-09-08 MED ORDER — POTASSIUM CHLORIDE CRYS ER 20 MEQ PO TBCR: 20.0000 meq | EXTENDED_RELEASE_TABLET | Freq: Two times a day (BID) | ORAL | 0 refills | Status: DC

## 2016-09-08 MED ORDER — PACLITAXEL CHEMO INJECTION 300 MG/50ML
200.0000 mg/m2 | Freq: Once | INTRAVENOUS | Status: AC
  Administered 2016-09-08: 324 mg via INTRAVENOUS
  Filled 2016-09-08: qty 54

## 2016-09-08 MED ORDER — FAMOTIDINE IN NACL 20-0.9 MG/50ML-% IV SOLN
20.0000 mg | Freq: Once | INTRAVENOUS | Status: AC
  Administered 2016-09-08: 20 mg via INTRAVENOUS
  Filled 2016-09-08: qty 50

## 2016-09-08 NOTE — Progress Notes (Signed)
Potassium rx sent per md order.

## 2016-09-08 NOTE — Progress Notes (Signed)
No ANC ordered with labs.  Verified with MD, wants to go ahead with treatment today.

## 2016-09-08 NOTE — Patient Instructions (Signed)

## 2016-09-11 ENCOUNTER — Encounter: Payer: Self-pay | Admitting: Internal Medicine

## 2016-09-11 ENCOUNTER — Encounter: Payer: Self-pay | Admitting: *Deleted

## 2016-09-12 ENCOUNTER — Emergency Department: Payer: 59

## 2016-09-12 ENCOUNTER — Ambulatory Visit
Admission: RE | Admit: 2016-09-12 | Discharge: 2016-09-12 | Disposition: A | Discharge status: Home / Self Care | Payer: 59 | Source: Ambulatory Visit | Attending: Internal Medicine | Admitting: Internal Medicine

## 2016-09-12 ENCOUNTER — Emergency Department
Admission: EM | Admit: 2016-09-12 | Discharge: 2016-09-12 | Disposition: A | Discharge status: Home / Self Care | Payer: 59 | Attending: Emergency Medicine | Admitting: Emergency Medicine

## 2016-09-12 ENCOUNTER — Encounter: Payer: Self-pay | Admitting: Internal Medicine

## 2016-09-12 ENCOUNTER — Inpatient Hospital Stay: Payer: 59

## 2016-09-12 DIAGNOSIS — C801 Malignant (primary) neoplasm, unspecified: Secondary | ICD-10-CM

## 2016-09-12 DIAGNOSIS — R9082 White matter disease, unspecified: Secondary | ICD-10-CM | POA: Insufficient documentation

## 2016-09-12 DIAGNOSIS — Z8589 Personal history of malignant neoplasm of other organs and systems: Secondary | ICD-10-CM | POA: Diagnosis not present

## 2016-09-12 DIAGNOSIS — K5900 Constipation, unspecified: Secondary | ICD-10-CM | POA: Diagnosis not present

## 2016-09-12 LAB — BASIC METABOLIC PANEL
Anion gap: 7 (ref 5–15)
BUN: 8 mg/dL (ref 6–20)
CALCIUM: 8.7 mg/dL — AB (ref 8.9–10.3)
CO2: 32 mmol/L (ref 22–32)
CREATININE: 0.51 mg/dL (ref 0.44–1.00)
Chloride: 92 mmol/L — ABNORMAL LOW (ref 101–111)
GFR calc Af Amer: >60 mL/min (ref >60)
GLUCOSE: 105 mg/dL — AB (ref 65–99)
Potassium: 3.4 mmol/L — ABNORMAL LOW (ref 3.5–5.1)
SODIUM: 131 mmol/L — AB (ref 135–145)

## 2016-09-12 LAB — URINALYSIS COMPLETE WITH MICROSCOPIC (ARMC ONLY)
BILIRUBIN URINE: NEGATIVE
Bacteria, UA: NONE SEEN
GLUCOSE, UA: NEGATIVE mg/dL
Hgb urine dipstick: NEGATIVE
Leukocytes, UA: NEGATIVE
Nitrite: NEGATIVE
PROTEIN: NEGATIVE mg/dL
RBC / HPF: NONE SEEN RBC/hpf (ref 0–5)
SQUAMOUS EPITHELIAL / LPF: NONE SEEN
Specific Gravity, Urine: 1.033 — ABNORMAL HIGH (ref 1.005–1.030)
pH: 5 (ref 5.0–8.0)

## 2016-09-12 LAB — CBC WITH DIFFERENTIAL/PLATELET
Basophils Absolute: 0 10*3/uL (ref 0–0.1)
Basophils Relative: 0 %
EOS ABS: 0.1 10*3/uL (ref 0–0.7)
EOS PCT: 1 %
HCT: 37.3 % (ref 35.0–47.0)
Hemoglobin: 12.9 g/dL (ref 12.0–16.0)
LYMPHS ABS: 0.9 10*3/uL — AB (ref 1.0–3.6)
LYMPHS PCT: 5 %
MCH: 30.1 pg (ref 26.0–34.0)
MCHC: 34.7 g/dL (ref 32.0–36.0)
MCV: 86.9 fL (ref 80.0–100.0)
MONO ABS: 0.4 10*3/uL (ref 0.2–0.9)
Monocytes Relative: 2 %
Neutro Abs: 17.9 10*3/uL — ABNORMAL HIGH (ref 1.4–6.5)
Neutrophils Relative %: 92 %
PLATELETS: 231 10*3/uL (ref 150–440)
RBC: 4.29 MIL/uL (ref 3.80–5.20)
RDW: 13.4 % (ref 11.5–14.5)
WBC: 19.3 10*3/uL — AB (ref 3.6–11.0)

## 2016-09-12 MED ORDER — GADOBENATE DIMEGLUMINE 529 MG/ML IV SOLN
15.0000 mL | Freq: Once | INTRAVENOUS | Status: AC | PRN
  Administered 2016-09-12: 11 mL via INTRAVENOUS

## 2016-09-12 MED ORDER — IOPAMIDOL (ISOVUE-300) INJECTION 61%
30.0000 mL | Freq: Once | INTRAVENOUS | Status: AC | PRN
  Administered 2016-09-12: 30 mL via ORAL
  Filled 2016-09-12: qty 30

## 2016-09-12 MED ORDER — LACTULOSE 10 G PO PACK: PACK | ORAL | 0 refills | Status: DC

## 2016-09-12 MED ORDER — IOPAMIDOL (ISOVUE-300) INJECTION 61%
100.0000 mL | Freq: Once | INTRAVENOUS | Status: AC | PRN
  Administered 2016-09-12: 100 mL via INTRAVENOUS
  Filled 2016-09-12: qty 100

## 2016-09-12 NOTE — ED Notes (Signed)
Pt given cup of water 

## 2016-09-12 NOTE — ED Triage Notes (Signed)
Pt states she is a CA pt under going tx for unknown type of cancer and has had constipation for the past 2 weeks.

## 2016-09-12 NOTE — Telephone Encounter (Signed)
x

## 2016-09-12 NOTE — Discharge Instructions (Signed)
Please follow up with your oncologist about the elevated white blood cells. If you do develop any fever, chest pain, cough, shortness of breath, pain with urination, or any other new or concerning symptoms, please return to the emergency department.

## 2016-09-12 NOTE — ED Provider Notes (Signed)
[facility] Emergency Department Provider Note   ____________________________________________   I have reviewed the triage vital signs and the nursing notes.   HISTORY  Chief Complaint Constipation   History limited by: Not Limited   HPI SHAUNE VIZCARRA is a 60 y.o. female with history of carcinoma without known primary who presents to the emergency department today with primary concern for constipation. The patient did recently undergo chemotherapy and there is some concern that the chemotherapy agents are not being appropriately excreted. In addition the patient states that she has a history of a tortuous colon which could be contributing to her symptoms.  She has had some associated abdominal discomfort but has not felt significant pain or pressure in her rectum. Has tried multiple oral agents as well as a fleet enema without significant output.    Past Medical History:  Diagnosis Date  . Arthritis   . Cancer ([place])    lymph nodes, in process of diagnosing  . Cervical disc disease   . Fracture 2017   left pubis bone-atypical  . Lymphadenopathy   . Neuromuscular disorder ([place])    compressed disc in neck when 33 or 60 years old  . Primary cancer of unknown site [facility])   . Tubular adenoma     Patient Active Problem List   Diagnosis Date Noted  . Chemotherapy-induced nausea 09/07/2016  . Primary cancer of unknown site ([place]) 08/25/2016  . Lymphadenopathy, generalized 08/11/2016  . Cervical disc disease 03/08/2016  . Tubular adenoma 03/08/2016    Past Surgical History:  Procedure Laterality Date  . APPENDECTOMY  1986  . BREAST BIOPSY Right    at age 76  . CESAREAN SECTION  1988  . COLONOSCOPY WITH PROPOFOL N/A 07/12/2015   Procedure: COLONOSCOPY WITH PROPOFOL;  Surgeon: Manya Silvas, MD;  Location: [facility] ENDOSCOPY;  Service: Endoscopy;  Laterality: N/A;  . EXPLORATORY LAPAROTOMY  1986  . MASS BIOPSY N/A 09/05/2016   Procedure: NECK MASS  BIOPSY;  Surgeon: Beverly Gust, MD;  Location: [facility] ORS;  Service: ENT;  Laterality: N/A;  . THORACOTOMY Right 03/2008   RLL Wedge Resection- Dr. Genevive Bi    Prior to Admission medications   Medication Sig Start Date End Date Taking? Authorizing Provider  acetaminophen (TYLENOL) 325 MG tablet Take 650 mg by mouth every 6 (six) hours as needed.    Historical Provider, MD  cyclobenzaprine (FLEXERIL) 10 MG tablet Take 5 mg by mouth 3 (three) times daily as needed for muscle spasms.  03/08/16   Historical Provider, MD  docusate sodium (COLACE) 100 MG capsule Take 100 mg by mouth.    Historical Provider, MD  Multiple Vitamin (MULTIVITAMIN) tablet Take 1 tablet by mouth daily.    Historical Provider, MD  naproxen sodium (ANAPROX) 220 MG tablet Take 220 mg by mouth daily as needed.     Historical Provider, MD  ondansetron (ZOFRAN) 8 MG tablet Take 1 tablet (8 mg total) by mouth 2 (two) times daily as needed for refractory nausea / vomiting. Start on day 3 after chemo. Patient not taking: Reported on 09/07/2016 09/07/16   Cammie Sickle, MD  oxyCODONE-acetaminophen (ROXICET) 5-325 MG tablet Take 1-2 tablets by mouth every 4 (four) hours as needed for severe pain. Patient not taking: Reported on 09/07/2016 09/05/16   Beverly Gust, MD  potassium chloride SA (K-DUR,KLOR-CON) 20 MEQ tablet Take 1 tablet (20 mEq total) by mouth 2 (two) times daily. X 14 days 09/08/16   Cammie Sickle, MD  prochlorperazine (COMPAZINE) 10 MG tablet Take 1 tablet (10 mg total) by mouth every 6 (six) hours as needed (Nausea or vomiting). Patient not taking: Reported on 09/07/2016 09/07/16   Cammie Sickle, MD  traMADol (ULTRAM) 50 MG tablet Take 50 mg by mouth every 6 (six) hours as needed.  06/30/16   Historical Provider, MD    Allergies Sulfa antibiotics  Family History  Problem Relation Age of Onset  . Cancer Father   . Heart attack Father   . Hypertension Brother   . Diabetes Maternal Grandmother    . Diabetes Paternal Grandmother   . Cancer Brother     Social History Social History  Substance Use Topics  . Smoking status: Never Smoker  . Smokeless tobacco: Never Used  . Alcohol use No    Review of Systems  Constitutional: Negative for fever. Cardiovascular: Negative for chest pain. Respiratory: Negative for shortness of breath. Gastrointestinal: Positive for abdominal pain and constipation. Genitourinary: Negative for dysuria. Musculoskeletal: Negative for back pain. Skin: Negative for rash. Neurological: Negative for headaches, focal weakness or numbness.  10-point ROS otherwise negative.  ____________________________________________   PHYSICAL EXAM:  VITAL SIGNS: ED Triage Vitals [09/12/16 1507]  Enc Vitals Group     BP (!) 135/91     Pulse Rate 97     Resp 16     Temp 98.3 F (36.8 C)     Temp Source Oral     SpO2 96 %     Weight 125 lb (56.7 kg)     Height 5\' 5"  (1.651 m)     Head Circumference      Peak Flow      Pain Score 4   Constitutional: Alert and oriented. Well appearing and in no distress. Eyes: Conjunctivae are normal. Normal extraocular movements. ENT   Head: Normocephalic and atraumatic.   Nose: No congestion/rhinnorhea.   Mouth/Throat: Mucous membranes are moist.   Neck: No stridor. Hematological/Lymphatic/Immunilogical: No cervical lymphadenopathy. Cardiovascular: Normal rate, regular rhythm.  No murmurs, rubs, or gallops.  Respiratory: Normal respiratory effort without tachypnea nor retractions. Breath sounds are clear and equal bilaterally. No wheezes/rales/rhonchi. Gastrointestinal: Soft and nontender. No distention.  Rectal: No impaction appreciated. Musculoskeletal: Normal range of motion in all extremities. No lower extremity edema. Neurologic:  Normal speech and language. No gross focal neurologic deficits are appreciated.  Skin:  Skin is warm, dry and intact. No rash noted. Psychiatric: Mood and affect are  normal. Speech and behavior are normal. Patient exhibits appropriate insight and judgment.  ____________________________________________    LABS (pertinent positives/negatives)  Labs Reviewed  CBC WITH DIFFERENTIAL/PLATELET - Abnormal; Notable for the following:       Result Value   WBC 19.3 (*)    Neutro Abs 17.9 (*)    Lymphs Abs 0.9 (*)    All other components within normal limits  BASIC METABOLIC PANEL - Abnormal; Notable for the following:    Sodium 131 (*)    Potassium 3.4 (*)    Chloride 92 (*)    Glucose, Bld 105 (*)    Calcium 8.7 (*)    All other components within normal limits  URINALYSIS COMPLETEWITH MICROSCOPIC ([facility] ONLY) - Abnormal; Notable for the following:    Color, Urine STRAW (*)    APPearance CLEAR (*)    Ketones, ur TRACE (*)    Specific Gravity, Urine 1.033 (*)    All other components within normal limits     ____________________________________________   EKG  None  ____________________________________________    RADIOLOGY  CT abd/pel IMPRESSION:  1. Diffuse dilatation of the colon, filled with fluid and air, and a  small to moderate of stool noted at the rectum. Trace free fluid  tracking about the cecum. This appears to reflect some degree of  constipation and colonic dysmotility. No obstructing mass seen. The  colon was decompressed on the recent prior study. Enema or  medication such as dulcolax could be considered to relieve  constipation, as deemed clinically appropriate.  2. Mild wall thickening along the course of the left ureter, raising  question for left-sided ureteritis.  3. Diffuse mesenteric edema, with numerous enlarged mesenteric and  retroperitoneal nodes, and mild surrounding soft tissue  inflammation. This is similar to the recent prior study, reflecting  the patient's known metastatic disease.  4. Known bony metastatic disease is better characterized at the left  side of the pubic symphysis, with underlying chronic  appearing  pathologic fracture line.  5. Trace left-sided pleural fluid, with mild bibasilar atelectasis  or scarring.  6. Small hiatal hernia noted.     ____________________________________________   PROCEDURES  Procedures  ____________________________________________   INITIAL IMPRESSION / ASSESSMENT AND PLAN / ED COURSE  Pertinent labs & imaging results that were available during my care of the patient were reviewed by me and considered in my medical decision making (see chart for details).  Patient here with concern for constipation. CT scan was consistent with constipation. Patient given soaps suds enema with decent output afterwards. Of note the patient did have a leukocytosis on blood work. I discussed this finding with oncologist on call, Dr. Sherrine Maples who thought it could be due to medication patient received at previous chemotherapy session. No obvious infection at this time. Did discuss return precautions with the patient, additionally will give patient prescription for lactulose to help facilitate bowel movements.  ____________________________________________   FINAL CLINICAL IMPRESSION(S) / ED DIAGNOSES  Final diagnoses:  Constipation, unspecified constipation type     Note: This dictation was prepared with Dragon dictation. Any transcriptional errors that result from this process are unintentional    Nance Pear, MD 09/12/16 2252

## 2016-09-13 ENCOUNTER — Inpatient Hospital Stay: Payer: 59 | Admitting: Internal Medicine

## 2016-09-13 ENCOUNTER — Encounter: Payer: Self-pay | Admitting: Internal Medicine

## 2016-09-13 ENCOUNTER — Ambulatory Visit: Payer: 59 | Admitting: Physical Therapy

## 2016-09-13 ENCOUNTER — Encounter: Payer: Self-pay | Admitting: Physical Therapy

## 2016-09-13 ENCOUNTER — Ambulatory Visit: Payer: 59 | Attending: Orthopedic Surgery | Admitting: Physical Therapy

## 2016-09-13 DIAGNOSIS — M6281 Muscle weakness (generalized): Secondary | ICD-10-CM | POA: Diagnosis not present

## 2016-09-13 DIAGNOSIS — R2689 Other abnormalities of gait and mobility: Secondary | ICD-10-CM | POA: Insufficient documentation

## 2016-09-13 LAB — SURGICAL PATHOLOGY

## 2016-09-13 NOTE — Patient Instructions (Addendum)
Six directions of the spine seated: feet on the ground   Chest lift 5 reps Side bend L/ R "rainbow" 5 reps  Small twist (10-20 deg rotation) inhale to lengthenspine, exhale rotate 10-20 deg  , hold 3 breaths   _________  Seated marching 30 sec,  30 sec rest, x 3 sets Seated marching with opposite hand on thigh tapping without turning trunk 30 sec,  30 sec rest, x 3 sets Placing feet at hip width apart   Couch scooting with back tall 3 repx back and forth  _________  Sylvia Jones 45 Degrees   Lying with hips and knees bent 45, one pillow between knees and ankles. Lift knee with exhale. Be sure pelvis does not roll backward. Do not arch back. Do 10 times, each leg, 2 times per day.  http://ss.exer.us/75   Copyright  VHI. All rights reserved.   __________________

## 2016-09-14 ENCOUNTER — Telehealth: Payer: Self-pay | Admitting: Internal Medicine

## 2016-09-14 ENCOUNTER — Inpatient Hospital Stay: Payer: 59

## 2016-09-14 ENCOUNTER — Encounter: Payer: Self-pay | Admitting: Internal Medicine

## 2016-09-14 ENCOUNTER — Inpatient Hospital Stay (HOSPITAL_BASED_OUTPATIENT_CLINIC_OR_DEPARTMENT_OTHER): Payer: 59 | Admitting: Internal Medicine

## 2016-09-14 VITALS — BP 116/79 | HR 88 | Temp 96.5°F | Resp 17 | Ht 65.0 in | Wt 123.8 lb

## 2016-09-14 DIAGNOSIS — M1990 Unspecified osteoarthritis, unspecified site: Secondary | ICD-10-CM | POA: Diagnosis not present

## 2016-09-14 DIAGNOSIS — C770 Secondary and unspecified malignant neoplasm of lymph nodes of head, face and neck: Secondary | ICD-10-CM

## 2016-09-14 DIAGNOSIS — K5900 Constipation, unspecified: Secondary | ICD-10-CM | POA: Diagnosis not present

## 2016-09-14 DIAGNOSIS — Z7689 Persons encountering health services in other specified circumstances: Secondary | ICD-10-CM | POA: Diagnosis not present

## 2016-09-14 DIAGNOSIS — C801 Malignant (primary) neoplasm, unspecified: Secondary | ICD-10-CM

## 2016-09-14 DIAGNOSIS — Z5111 Encounter for antineoplastic chemotherapy: Secondary | ICD-10-CM | POA: Diagnosis not present

## 2016-09-14 DIAGNOSIS — C7951 Secondary malignant neoplasm of bone: Secondary | ICD-10-CM | POA: Diagnosis not present

## 2016-09-14 DIAGNOSIS — Z79899 Other long term (current) drug therapy: Secondary | ICD-10-CM | POA: Diagnosis not present

## 2016-09-14 LAB — CBC WITH DIFFERENTIAL/PLATELET
Basophils Absolute: 0 10*3/uL (ref 0–0.1)
Basophils Relative: 0 %
Eosinophils Absolute: 0.2 10*3/uL (ref 0–0.7)
Eosinophils Relative: 2 %
HEMATOCRIT: 36.9 % (ref 35.0–47.0)
HEMOGLOBIN: 13 g/dL (ref 12.0–16.0)
LYMPHS ABS: 1 10*3/uL (ref 1.0–3.6)
LYMPHS PCT: 10 %
MCH: 30 pg (ref 26.0–34.0)
MCHC: 35.2 g/dL (ref 32.0–36.0)
MCV: 85.2 fL (ref 80.0–100.0)
Monocytes Absolute: 1.2 10*3/uL — ABNORMAL HIGH (ref 0.2–0.9)
Monocytes Relative: 11 %
NEUTROS PCT: 77 %
Neutro Abs: 8.4 10*3/uL — ABNORMAL HIGH (ref 1.4–6.5)
Platelets: 239 10*3/uL (ref 150–440)
RBC: 4.33 MIL/uL (ref 3.80–5.20)
RDW: 13.3 % (ref 11.5–14.5)
WBC: 10.9 10*3/uL (ref 3.6–11.0)

## 2016-09-14 LAB — BASIC METABOLIC PANEL
Anion gap: 12 (ref 5–15)
BUN: 8 mg/dL (ref 6–20)
CHLORIDE: 96 mmol/L — AB (ref 101–111)
CO2: 27 mmol/L (ref 22–32)
Calcium: 9.6 mg/dL (ref 8.9–10.3)
Creatinine, Ser: 0.43 mg/dL — ABNORMAL LOW (ref 0.44–1.00)
GFR calc Af Amer: >60 mL/min (ref >60)
GFR calc non Af Amer: >60 mL/min (ref >60)
GLUCOSE: 103 mg/dL — AB (ref 65–99)
POTASSIUM: 3.1 mmol/L — AB (ref 3.5–5.1)
SODIUM: 135 mmol/L (ref 135–145)

## 2016-09-14 MED ORDER — DENOSUMAB 120 MG/1.7ML ~~LOC~~ SOLN
120.0000 mg | Freq: Once | SUBCUTANEOUS | Status: AC
  Administered 2016-09-14: 120 mg via SUBCUTANEOUS
  Filled 2016-09-14: qty 1.7

## 2016-09-14 NOTE — Therapy (Addendum)
[place] MAIN [facility] SERVICES [address] [place], [place], [address] Phone: [phone]   Fax:  [phone]  Physical Therapy Evaluation  Patient Details  Name: Sylvia Jones MRN: [id number] Date of Birth: 11/11/1956 Referring Provider: Dr. Prescott Parma  Encounter Date: 09/13/2016      PT End of Session - 09/13/16 0937    Visit Number 1   Number of Visits 12   Date for PT Re-Evaluation 11/03/16   PT Start Time 1510   PT Stop Time [id number]   PT Time Calculation (min) 65 min   Activity Tolerance Patient tolerated treatment well;No increased pain   Behavior During Therapy WFL for tasks assessed/performed      Past Medical History:  Diagnosis Date  . Arthritis   . Cancer ([place])    lymph nodes, in process of diagnosing  . Cervical disc disease    upper cervical, denied numbness / tingling BUE  . Fracture 04/2016   left pubis bone-atypical   . Lymphadenopathy 09/05/2016   R clavical region  . Neuromuscular disorder ([place])    compressed disc in neck when 63 or 60 years old  . Primary cancer of unknown site [facility])   . Tubular adenoma     Past Surgical History:  Procedure Laterality Date  . APPENDECTOMY  1986  . BREAST BIOPSY Right    at age 38  . CESAREAN SECTION  1988  . CESAREAN SECTION  1988  . COLONOSCOPY WITH PROPOFOL N/A 07/12/2015   Procedure: COLONOSCOPY WITH PROPOFOL;  Surgeon: Manya Silvas, MD;  Location: [facility] ENDOSCOPY;  Service: Endoscopy;  Laterality: N/A;  . EXPLORATORY LAPAROTOMY  1986  . LYMPH GLAND EXCISION    . MASS BIOPSY N/A 09/05/2016   Procedure: NECK MASS BIOPSY;  Surgeon: Beverly Gust, MD;  Location: [facility] ORS;  Service: ENT;  Laterality: N/A;  . MASS EXCISION  1986   to remove mass outside the uterus. procedure was combined tacked uterus, reposition of fallopian tubes   . PORTACATH PLACEMENT Left 09/20/2016   Procedure: INSERTION PORT-A-CATH;  Surgeon: Olean Ree, MD;  Location: [facility] ORS;  Service:  General;  Laterality: Left;  . THORACOTOMY Right 03/2008   RLL Wedge Resection- Dr. Genevive Bi    There were no vitals filed for this visit.       Subjective Assessment - 09/13/16 1515   Subjective 1) Pt was referred to treat a pubic bone Fx. Pt reported pain described as inflammation sensation deep in her L groin with walking in June 2017.   It became progressively worse to the point where she was not to lift her leg.  X ray showed a Fx at L pubic bone.  Pt was advised to not bear weight on the LLE for 2 weeks and utilized walker and to use WC.  Pt was advised to continue no weight bearing for another 3 weeks due to no healing of the Fx. Pt got a bone density test which showed osteoporosis. Pt has also reported tenderness in the abdomen and swollen lymph nodes in her neck which led her to receive a CT scan. 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  Pt was scheduled on 9/ 2017 for a biopsy of her lymph gland. The results was positive for malignancy but the type of CA was unknown. Last week, pt underwent surgery to remove the lymph node. Currently, pt is able to lift her  leg without pain.    2)  Constipation has been an issue on and off since 2009 after a thoracotomy to remove a nodule in lung.  AFter the Fx, pt was sendentary due to weight bearing restrictions and bowel movement frequency occured 1-2 weeks with difficulty with Type 1 Bristol Stool Type.  On 10/13, 2017 pt started chemotherapy and was not able to pass her stools over the weekend which led her to the ED yesterday. Enema helped pt to eliminate after 2 weeks of constipation.              [facility] PT Assessment - 10/04/16 0932      Assessment   Medical Diagnosis Closed Fx of L pubis   Referring Provider Dr. Prescott Parma     Precautions   Precautions Other (comment)     Restrictions   Weight Bearing Restrictions Yes   Other Position/Activity Restrictions partial weight  bearing      Balance Screen   Has the patient fallen in the past 6 months Yes   How many times? 1     Prior Function   Level of Independence Independent with household mobility with device;Independent with community mobility with device     Strength   Overall Strength Comments hip/knee flex B 4/5, Hip abd L 3+/5, R 3+/5       Palpation   Palpation comment no pain w/palpation over pubic symphysis  abdominal scar present w/ increased scar restrictions     Bed Mobility   Bed Mobility --  without difficulty, no crunch method     Transfers   Comments w/c <> bed IND                   [facility] Adult PT Treatment/Exercise - 10/04/16 0936      Therapeutic Activites    Therapeutic Activities --  see pt instructions                PT Education - 10/04/16 0936    Education provided Yes   Education Details POC, anatomy,physiology, goals, PT modifying interventions with consideration of active CA, activity pacing, graded movement to increase endurance   Person(s) Educated Patient;Spouse   Methods Explanation;Demonstration;Tactile cues;Verbal cues;Handout   Comprehension Returned demonstration;Verbalized understanding             PT Long Term Goals - 10/04/16 0939      PT LONG TERM GOAL #1   Title Pt will demo proper pelvic floor coordination (no straining) with other deep core mm in order to faciliate bowel movements   Time 12   Period Weeks   Status New     PT LONG TERM GOAL #2   Title Pt will demo increased hip sabduction strength from 3+/5 B to > 4/5 in order to prepare for full weight bearing and gait/stairclimbing with lowered risks for falls   Time 12   Period Weeks   Status New     PT LONG TERM GOAL #3   Title Pt will be IND with HEP    Time 12   Period Weeks   Status New     PT LONG TERM GOAL #4   Title Pt will report improved stool consistency from Type 1 on Bristol Stool Scale to Type 3-4 in order to improve constipation   Time 12    Period Weeks   Status New               Plan - 10/04/16 0945  Clinical Impression Statement Pt is a 60 yo female who was referred to PT for a L pubis bone Fx 2/2 osteoporosis and lesions from an unknown form of malignant cancer. Pt also reports constipation 2/2 long periods of significantly decreased physical activity and non-weightbearing status. Pt's clinical presentations include abdominal scar restrictions which PT will withhold scar massage due to pt's CA status. Pt also showed hip weakness and poor posture which PT starting addressing on today's visit with strengthening and graded movement exercises incorporated into her HEP. Pt showed IND with t/f and bed mobility w/o pain but gait was not assessed today. Plan to address water / fiber intake to help with pt's constipation issues at next session. Tx to the pubis area will be withheld at this time due to pt's active lesions. PT plans to review her medical records and communicate with pt and MDs to stay abreast on the status of her CA Tx process and weight-bearing status in order to devise and modify her POC as appropriate.     Rehab Potential Fair   PT Frequency 1x / week   PT Duration 12 weeks   PT Treatment/Interventions Patient/family education;Stair training;Therapeutic activities;Neuromuscular re-education;Therapeutic exercise;Manual lymph drainage;Manual techniques;Scar mobilization;ADLs/Self Care Home Management;Aquatic Therapy;Moist Heat;Functional mobility training   Consulted and Agree with Plan of Care Patient      Patient will benefit from skilled therapeutic intervention in order to improve the following deficits and impairments:  Decreased safety awareness, Decreased endurance, Decreased range of motion, Decreased coordination, Improper body mechanics, Postural dysfunction, Decreased strength, Decreased activity tolerance, Decreased balance  Visit Diagnosis: Muscle weakness (generalized)  Other abnormalities of gait  and mobility     Problem List Patient Active Problem List   Diagnosis Date Noted  . Cancer, metastatic to bone ([place]) 09/14/2016  . Chemotherapy-induced nausea 09/07/2016  . Primary cancer of unknown site ([place]) 08/25/2016  . Lymphadenopathy 08/11/2016  . Cervical disc disease 03/08/2016  . Tubular adenoma 03/08/2016    Jerl Mina ,PT, DPT, E-RYT  10/04/2016, 9:59 AM  [place] MAIN [facility] SERVICES [address] [place], [place], [address] Phone: [phone]   Fax:  [phone]  Name: Sylvia Jones MRN: [id number] Date of Birth: 1956/11/06

## 2016-09-14 NOTE — Telephone Encounter (Signed)
Spoke to Dr.Dimitrova in [facility] radiology; okay to have breast MRI as pt recently had negative mammo in June 2017. Please schedule the mammogram.

## 2016-09-14 NOTE — Assessment & Plan Note (Signed)
#  Status post excisional lymph node biopsy- positive for carcinoma- question breast primary [based on immunohistochemistry]; no breast masses noted on imaging/or on exam; check breast panel; MMR; [employer]- PDL-1; cancer type ID-pending.  # s/p #1 carbo-taxol - tolerated chemo well; except for constipation  # constipation- recommend miralax/dulcolax  # port referral to Dr.[place].   # Skeletal metastases- recommend X-geva ; Start today.every 6 weeks.   # follow up on Nov 3rd chemo/labs.

## 2016-09-14 NOTE — Progress Notes (Signed)
[facility] NOTE  Patient Care Team: Rusty Aus, MD as PCP - General (Internal Medicine)  CHIEF COMPLAINTS/PURPOSE OF CONSULTATION:   Oncology History   # SEP 2017- LYMPHADENOPATHY- extensive- abdominal/RP/ bil neck LN R>L. [place] Bx- POSITIVE for MALIGNANCY; PET- extensive adenopathy  # OCT 2017- ADENO CA/ Ca of Unknown Primary- [ IHC- s/o Breast primary; ca-27-29/ca-125/ca19-9-Elevated; CEA-Normal];  # OCT 13th- CARBO-TAXOL  # Bone mets- X-geva q 6 W  # Lung RUL nodule [benign s/p resection; 2009- Dr.Oaks]  # spring 2017- colonoscopy [Dr.Elliot; polyps]; June mammo-NEG     Primary cancer of unknown site [facility])   08/25/2016 Initial Diagnosis    Primary cancer of unknown site [facility])       Oncology History   # SEP 2017- LYMPHADENOPATHY- extensive- abdominal/RP/ bil neck LN R>L. [place] Bx- POSITIVE for MALIGNANCY; PET- extensive adenopathy  # OCT 2017- ADENO CA/ Ca of Unknown Primary- [ IHC- s/o Breast primary; ca-27-29/ca-125/ca19-9-Elevated; CEA-Normal];  # OCT 13th- CARBO-TAXOL  # Bone mets- X-geva q 6 W  # Lung RUL nodule [benign s/p resection; 2009- Dr.Oaks]  # spring 2017- colonoscopy [Dr.Elliot; polyps]; June mammo-NEG     Primary cancer of unknown site [facility])   08/25/2016 Initial Diagnosis    Primary cancer of unknown site [facility])       HISTORY OF PRESENTING ILLNESS:  Sylvia Jones 60 y.o.  female with a history of carcinoma of unknown primary [possible breast cancer based on IHC] status post cycle #1 of carbotaxol approximately 10 days ago is here for follow-up.  Interim patient was seen in the emergency room for constipation; needed disimpaction. Had a CT scan- showed no obstruction. Enema improved her constipation.  Patient noted to have mild improvement of the swelling of the left side of the neck. Appetite is fair. No tingling and numbness. No significant nausea vomiting.  ROS: A complete 10 point review of system is done which is  negative except mentioned above in history of present illness  MEDICAL HISTORY:  Past Medical History:  Diagnosis Date  . Arthritis   . Cancer ([place])    lymph nodes, in process of diagnosing  . Cervical disc disease    upper cervical, denied numbness / tingling BUE  . Fracture 04/2016   left pubis bone-atypical   . Lymphadenopathy 09/05/2016   R clavical region  . Neuromuscular disorder ([place])    compressed disc in neck when 19 or 60 years old  . Primary cancer of unknown site [facility])   . Tubular adenoma     SURGICAL HISTORY: Past Surgical History:  Procedure Laterality Date  . APPENDECTOMY  1986  . BREAST BIOPSY Right    at age 27  . CESAREAN SECTION  1988  . CESAREAN SECTION  1988  . COLONOSCOPY WITH PROPOFOL N/A 07/12/2015   Procedure: COLONOSCOPY WITH PROPOFOL;  Surgeon: Manya Silvas, MD;  Location: [facility] ENDOSCOPY;  Service: Endoscopy;  Laterality: N/A;  . EXPLORATORY LAPAROTOMY  1986  . MASS BIOPSY N/A 09/05/2016   Procedure: NECK MASS BIOPSY;  Surgeon: Beverly Gust, MD;  Location: [facility] ORS;  Service: ENT;  Laterality: N/A;  . MASS EXCISION  1986   to remove mass outside the uterus. procedure was combined tacked uterus, reposition of fallopian tubes   . THORACOTOMY Right 03/2008   RLL Wedge Resection- Dr. 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  Marital status: Married    Spouse name: N/A  . Number of children: N/A  . Years of education: N/A   Occupational History  . Not on file.   Social History Main Topics  . Smoking status: Never Smoker  . Smokeless tobacco: Never Used  . Alcohol use No  . Drug use: No  . Sexual activity: Yes   Other Topics Concern  . Not on file   Social History Narrative  . No narrative on file    FAMILY HISTORY: father- ? Cancer died 25. .. Brother- kasposi sarcoma Family History  Problem Relation Age of Onset  .  Cancer Father   . Heart attack Father   . Hypertension Brother   . Diabetes Maternal Grandmother   . Diabetes Paternal Grandmother   . Cancer Brother     ALLERGIES:  is allergic to sulfa antibiotics.  MEDICATIONS:  Current Outpatient Prescriptions  Medication Sig Dispense Refill  . lactulose (CEPHULAC) 10 g packet Take 10 grams three times daily for the next 2 days. Then 10 grams daily, can increase to 10 grams three times a day for future concerns for worsening constipation 30 each 0  . lactulose, encephalopathy, (CHRONULAC) 10 GM/15ML SOLN Take by mouth.    . potassium chloride SA (K-DUR,KLOR-CON) 20 MEQ tablet Take 1 tablet (20 mEq total) by mouth 2 (two) times daily. X 14 days 28 tablet 0  . acetaminophen (TYLENOL) 325 MG tablet Take 650 mg by mouth every 6 (six) hours as needed.    . cyclobenzaprine (FLEXERIL) 10 MG tablet Take 5 mg by mouth 3 (three) times daily as needed for muscle spasms.     Marland Kitchen docusate sodium (COLACE) 100 MG capsule Take 100 mg by mouth.    . Multiple Vitamin (MULTIVITAMIN) tablet Take 1 tablet by mouth daily.    . naproxen sodium (ANAPROX) 220 MG tablet Take 220 mg by mouth daily as needed.     . ondansetron (ZOFRAN) 8 MG tablet Take 1 tablet (8 mg total) by mouth 2 (two) times daily as needed for refractory nausea / vomiting. Start on day 3 after chemo. (Patient not taking: Reported on 09/14/2016) 30 tablet 1  . oxyCODONE-acetaminophen (ROXICET) 5-325 MG tablet Take 1-2 tablets by mouth every 4 (four) hours as needed for severe pain. (Patient not taking: Reported on 09/14/2016) 40 tablet 0  . prochlorperazine (COMPAZINE) 10 MG tablet Take 1 tablet (10 mg total) by mouth every 6 (six) hours as needed (Nausea or vomiting). (Patient not taking: Reported on 09/14/2016) 30 tablet 1  . traMADol (ULTRAM) 50 MG tablet Take 50 mg by mouth every 6 (six) hours as needed.      No current facility-administered medications for this visit.       Marland Kitchen  PHYSICAL  EXAMINATION: ECOG PERFORMANCE STATUS: 1 - Symptomatic but completely ambulatory  Vitals:   09/14/16 0825  BP: 116/79  Pulse: 88  Resp: 17  Temp: (!) 96.5 F (35.8 C)   Filed Weights   09/14/16 0825  Weight: 123 lb 12.8 oz (56.2 kg)    GENERAL: Well-nourished well-developed; Alert, no distress and comfortable.   With husband. Patient is wheelchair. EYES: no pallor or icterus OROPHARYNX: no thrush or ulceration; good dentition  NECK: supple, no masses felt LYMPH:  Matted lymphadenopathy noted in the left side of the neck; and also lymphadenopathy noted on the right neck posterior neck [left supra-clav- ~4cm] LUNGS: clear to auscultation and  No wheeze or crackles HEART/CVS: regular rate &  rhythm and no murmurs; No lower extremity edema ABDOMEN: abdomen soft, non-tender and normal bowel sounds Musculoskeletal:no cyanosis of digits and no clubbing  PSYCH: alert & oriented x 3 with fluent speech NEURO: no focal motor/sensory deficits SKIN:  no rashes or significant lesions   LABORATORY DATA:  I have reviewed the data as listed Lab Results  Component Value Date   WBC 10.9 09/14/2016   HGB 13.0 09/14/2016   HCT 36.9 09/14/2016   MCV 85.2 09/14/2016   PLT 239 09/14/2016    Recent Labs  08/11/16 1030  08/25/16 1632  09/07/16 1512 09/12/16 1805 09/14/16 0759  NA 122*  < > 138  < > 137 131* 135  K 3.2*  < > 3.2*  < > 3.0* 3.4* 3.1*  CL 85*  < > 103  --  98* 92* 96*  CO2 23  < > 27  --  25 32 27  GLUCOSE 83  < > 99  < > 109* 105* 103*  BUN 12  < > 7  --  8 8 8   CREATININE 0.46  < > 0.40*  --  0.43* 0.51 0.43*  CALCIUM 10.5*  < > 9.5  --  9.3 8.7* 9.6  GFRNONAA >60  < > >60  --  >60 >60 >60  GFRAA >60  < > >60  --  >60 >60 >60  PROT 8.1  --  8.0  --  7.4  --   --   ALBUMIN 4.9  --  4.5  --  4.2  --   --   AST 49*  --  34  --  89*  --   --   ALT 41  --  23  --  104*  --   --   ALKPHOS 121  --  104  --  131*  --   --   BILITOT 1.5*  --  0.4  --  1.1  --   --   < > =  values in this interval not displayed.  RADIOGRAPHIC STUDIES: I have personally reviewed the radiological images as listed and agreed with the findings in the report. Mr Jeri Cos Wo Contrast  Result Date: 09/12/2016 CLINICAL DATA:  Primary cancer of unknown site. Recent fall hit head injury EXAM: MRI HEAD WITHOUT AND WITH CONTRAST TECHNIQUE: Multiplanar, multiecho pulse sequences of the brain and surrounding structures were obtained without and with intravenous contrast. CONTRAST:  33m MULTIHANCE GADOBENATE DIMEGLUMINE 529 MG/ML IV SOLN COMPARISON:  None. FINDINGS: Brain: Ventricle size normal. Cerebral volume normal. Negative for acute infarct. Mild changes in the cerebral white matter consistent with chronic microvascular ischemia. Negative for hemorrhage or fluid collection. Negative for mass or edema. Normal enhancement postcontrast administration. Negative for metastatic disease. Leptomeningeal enhancement normal. Vascular: Normal arterial flow voids. Normal venous enhancement following contrast infusion. Skull and upper cervical spine: Negative Sinuses/Orbits: Negative Other: None IMPRESSION: No acute abnormality. Negative for metastatic disease. Mild chronic microvascular ischemic change in the white matter. Electronically Signed   By: CFranchot GalloM.D.   On: 09/12/2016 15:47   Ct Abdomen Pelvis W Contrast  Result Date: 09/12/2016 CLINICAL DATA:  Subacute onset of constipation. Mid abdominal pain and bloating. Initial encounter. EXAM: CT ABDOMEN AND PELVIS WITH CONTRAST TECHNIQUE: Multidetector CT imaging of the abdomen and pelvis was performed using the standard protocol following bolus administration of intravenous contrast. CONTRAST:  1026mISOVUE-300 IOPAMIDOL (ISOVUE-300) INJECTION 61% COMPARISON:  PET/CT performed 08/29/2016 FINDINGS: Lower chest: Trace left-sided pleural fluid  is noted, with mild bibasilar atelectasis or scarring. The visualized portions of the mediastinum are  unremarkable. A small hiatal hernia is noted. Hepatobiliary: The liver is unremarkable in appearance. The gallbladder is unremarkable in appearance. The common bile duct remains normal in caliber. Pancreas: The pancreas is within normal limits. Spleen: The spleen is unremarkable in appearance. Adrenals/Urinary Tract: The adrenal glands are unremarkable in appearance. The kidneys are within normal limits. There is no evidence of hydronephrosis. No renal or ureteral stones are identified. Mild nonspecific perinephric stranding is noted bilaterally. There is mild wall thickening along the course of the left ureter, raising question for left-sided ureteritis. Stomach/Bowel: The stomach is filled with contrast and is otherwise grossly unremarkable. The small bowel is unremarkable in appearance. There is diffuse dilatation of the colon, filled with fluid and air, with a small to moderate amount of stool noted at the rectum. Trace free fluid is noted about the cecum, of uncertain significance. This appears to reflect some degree of constipation and colonic dysmotility. No obstructing mass is seen. The colon was decompressed on the recent prior study. Vascular/Lymphatic: The abdominal aorta is unremarkable in appearance. The inferior vena cava is grossly unremarkable. No retroperitoneal lymphadenopathy is seen. No pelvic sidewall lymphadenopathy is identified. There is diffuse mesenteric edema, with numerous enlarged mesenteric and retroperitoneal nodes, and mild surrounding soft tissue inflammation. This appearance is similar to the prior study, reflecting the patient's known metastatic disease. Reproductive: The bladder is mildly distended and grossly unremarkable. The uterus is grossly unremarkable in appearance. No suspicious adnexal masses are seen. The ovaries appear relatively symmetric. Other: No additional soft tissue abnormalities are seen. Musculoskeletal: Known bony metastatic disease is better characterized at  the left side of the pubic symphysis, with an underlying relatively chronic appearing pathologic fracture line. The visualized musculature is unremarkable in appearance. IMPRESSION: 1. Diffuse dilatation of the colon, filled with fluid and air, and a small to moderate of stool noted at the rectum. Trace free fluid tracking about the cecum. This appears to reflect some degree of constipation and colonic dysmotility. No obstructing mass seen. The colon was decompressed on the recent prior study. Enema or medication such as dulcolax could be considered to relieve constipation, as deemed clinically appropriate. 2. Mild wall thickening along the course of the left ureter, raising question for left-sided ureteritis. 3. Diffuse mesenteric edema, with numerous enlarged mesenteric and retroperitoneal nodes, and mild surrounding soft tissue inflammation. This is similar to the recent prior study, reflecting the patient's known metastatic disease. 4. Known bony metastatic disease is better characterized at the left side of the pubic symphysis, with underlying chronic appearing pathologic fracture line. 5. Trace left-sided pleural fluid, with mild bibasilar atelectasis or scarring. 6. Small hiatal hernia noted. Electronically Signed   By: Garald Balding M.D.   On: 09/12/2016 18:06   Nm Pet Image Initial (pi) Skull Base To Thigh  Result Date: 08/29/2016 CLINICAL DATA:  Initial treatment strategy for primary cancer of unknown site. EXAM: NUCLEAR MEDICINE PET SKULL BASE TO THIGH TECHNIQUE: 13.28 mCi F-18 FDG was injected intravenously. Full-ring PET imaging was performed from the skull base to thigh after the radiotracer. CT data was obtained and used for attenuation correction and anatomic localization. FASTING BLOOD GLUCOSE:  Value: 88 mg/dl COMPARISON:  CT eat of the chest status pelvis from 08/07/2016. FINDINGS: NECK Hypermetabolic bilateral cervical lymph nodes are identified. Index left level 2 lymph node measures 8 mm  and has an SUV max equal to 8.8. Right  level 2 lymph measures 7 mm and has an SUV max equal to 7.7, image 24 of series 3. CHEST No hypermetabolic mediastinal or hilar nodes. No suspicious pulmonary nodules on the CT scan. Increased radiotracer uptake within the distal esophagus noted. SUV max equals 4.9. Calcified granuloma is identified within the right lower lobe. There is a small left pleural effusion noted. No hypermetabolic pulmonary nodules identified ABDOMEN/PELVIS No abnormal hypermetabolic activity within the liver, pancreas, adrenal glands, or spleen. Extensive hypermetabolic upper abdominal adenopathy identified. Index gastrohepatic ligament node measures 1 cm and has an SUV max equal to 4.8. Portahepatic node measures 1.6 cm and has an SUV max equal to 12.5. Hypermetabolic periaortic node measures 1 cm and has an SUV max equal to 9.14. Numerous hypermetabolic mesenteric nodes identified. Index node with in the jejunal mesenteric measures 1 cm and has an SUV max equal to 6.6. Hypermetabolic left common iliac and left external iliac nodes identified. Left external iliac node measures 1.5 cm and has an SUV max equal to 9.1. SKELETON Multifocal hypermetabolic lytic bone metastases are identified. Index lytic lesion involving the posterior elements of C7 has an SUV max equal to 7.5, image 45 of series 3. Hypermetabolic lytic lesion involving the posterior aspect of the left first rib has an SUV max equal to 6.28, image 52 of series 3. Lytic lesion involving the T3 vertebra has an SUV max equal to 5.17. Destructive bone lesion involving the left pubic bone with associated pathologic fracture is identified, image 232 of series 3. SUV max is equal to 10.9. IMPRESSION: 1. Examination is positive for hypermetabolic metastatic adenopathy within the neck, abdomen and pelvis. 2. Multifocal hypermetabolic lytic bone metastases including lesion involving the T3 vertebra. 3. Nonspecific increased radiotracer uptake  localizes to the distal aspect of the esophagus. Electronically Signed   By: Kerby Moors M.D.   On: 08/29/2016 14:38   US Biopsy  Result Date: 09/04/2016 INDICATION: Abnormal cervical lymphadenopathy EXAM: ULTRASOUND-GUIDED BIOPSY OF A RIGHT CERVICAL LYMPH NODE.  CORE. MEDICATIONS: None. ANESTHESIA/SEDATION: Fentanyl 100 mcg IV; Versed 2 mg IV Moderate Sedation Time:  35 The patient was continuously monitored during the procedure by the interventional radiology nurse under my direct supervision. FLUOROSCOPY TIME:  Fluoroscopy Time:  minutes  seconds ( mGy). COMPLICATIONS: None immediate. PROCEDURE: Informed written consent was obtained from the patient after a thorough discussion of the procedural risks, benefits and alternatives. All questions were addressed. Maximal Sterile Barrier Technique was utilized including caps, mask, sterile gowns, sterile gloves, sterile drape, hand hygiene and skin antiseptic. A timeout was performed prior to the initiation of the procedure. Initial imaging in the left supraclavicular region demonstrates only very small lymph nodes. Enlarged lymph nodes in the right neck or identified. The right neck was prepped with ChloraPrep in a sterile fashion, and a sterile drape was applied covering the operative field. A sterile gown and sterile gloves were used for the procedure. Under sonographic guidance, 6 18 gauge core biopsies of an enlarged right cervical node were obtain. Final imaging was performed. Patient tolerated the procedure well without complication. Vital sign monitoring by nursing staff during the procedure will continue as patient is in the special procedures unit for post procedure observation. FINDINGS: The images document guide needle placement within the enlarged right cervical lymph node. Post biopsy images demonstrate no hemorrhage. IMPRESSION: Successful ultrasound-guided core biopsy of an enlarged right neck lymph node. Electronically Signed   By: Marybelle Killings  M.D.   On: 09/04/2016 10:45  US Biopsy  Result Date: 08/22/2016 INDICATION: Right cervical lymph node. EXAM: ULTRASOUND BIOPSY CORE LIVER MEDICATIONS: None. ANESTHESIA/SEDATION: Moderate (conscious) sedation was employed during this procedure. A total of Versed 2.0 mg and Fentanyl 50 mcg was administered intravenously. Moderate Sedation Time: 24 minutes. The patient's level of consciousness and vital signs were monitored continuously by radiology nursing throughout the procedure under my direct supervision. COMPLICATIONS: None immediate. PROCEDURE: Informed written consent was obtained from the patient after a thorough discussion of the procedural risks, benefits and alternatives. All questions were addressed. Sterile Barrier Technique was utilized including mask, sterile gloves, sterile drape, hand hygiene and skin antiseptic. A timeout was performed prior to the initiation of the procedure. Patient was placed supine on the structure. The right neck was prepped and draped in sterile technique. Local anesthetic was applied. Under real-time ultrasound guidance, 17 gauge guiding needle was directed into right cervical lymph node. Four core samples were obtained using 18 gauge biopsy needle. These were given to pathology technologist who was present at that time. Needles were removed and appropriate dressing was applied. IMPRESSION: Under real-time ultrasound guidance, percutaneous core biopsy of right cervical lymph node was performed. Electronically Signed   By: Marijo Conception, M.D.   On: 08/22/2016 10:02    ASSESSMENT & PLAN:   Primary cancer of unknown site [facility]) # Status post excisional lymph node biopsy- positive for carcinoma- question breast primary [based on immunohistochemistry]; no breast masses noted on imaging/or on exam; check breast panel; MMR; [employer]- PDL-1; cancer type ID-pending.  # s/p #1 carbo-taxol - tolerated chemo well; except for constipation  # constipation- recommend  miralax/dulcolax  # port referral to Dr.Oaks.   # Skeletal metastases- recommend X-geva ; Start today.every 6 weeks.   # follow up on Nov 3rd chemo/labs.    Cammie Sickle, MD 09/15/2016 6:26 PM

## 2016-09-14 NOTE — Progress Notes (Signed)
Main concern is constipation.

## 2016-09-14 NOTE — Telephone Encounter (Signed)
Spoke with provider. clarification of order. Proceed with MRI scheduling only.   msg sent to cancer ctr sch. To arrange.

## 2016-09-15 ENCOUNTER — Ambulatory Visit: Payer: 59 | Admitting: General Surgery

## 2016-09-15 ENCOUNTER — Other Ambulatory Visit: Payer: Self-pay

## 2016-09-15 ENCOUNTER — Ambulatory Visit: Payer: 59 | Admitting: Cardiothoracic Surgery

## 2016-09-15 DIAGNOSIS — C801 Malignant (primary) neoplasm, unspecified: Secondary | ICD-10-CM

## 2016-09-17 ENCOUNTER — Ambulatory Visit (HOSPITAL_COMMUNITY)
Admission: RE | Admit: 2016-09-17 | Discharge: 2016-09-17 | Disposition: A | Discharge status: Home / Self Care | Payer: 59 | Source: Ambulatory Visit | Attending: Internal Medicine | Admitting: Internal Medicine

## 2016-09-17 DIAGNOSIS — C801 Malignant (primary) neoplasm, unspecified: Secondary | ICD-10-CM | POA: Diagnosis not present

## 2016-09-17 MED ORDER — GADOBENATE DIMEGLUMINE 529 MG/ML IV SOLN
11.0000 mL | Freq: Once | INTRAVENOUS | Status: DC | PRN
Start: 2016-09-17 — End: 2016-09-18

## 2016-09-17 MED ORDER — GADOBENATE DIMEGLUMINE 529 MG/ML IV SOLN
11.0000 mL | Freq: Once | INTRAVENOUS | Status: AC | PRN
  Administered 2016-09-17: 11 mL via INTRAVENOUS

## 2016-09-18 ENCOUNTER — Encounter: Payer: Self-pay | Admitting: Surgery

## 2016-09-18 ENCOUNTER — Other Ambulatory Visit: Payer: Self-pay

## 2016-09-18 ENCOUNTER — Telehealth: Payer: Self-pay | Admitting: Surgery

## 2016-09-18 ENCOUNTER — Ambulatory Visit (INDEPENDENT_AMBULATORY_CARE_PROVIDER_SITE_OTHER): Payer: 59 | Admitting: Surgery

## 2016-09-18 VITALS — BP 114/73 | HR 94 | Temp 98.3°F | Ht 65.0 in | Wt 125.0 lb

## 2016-09-18 DIAGNOSIS — C801 Malignant (primary) neoplasm, unspecified: Secondary | ICD-10-CM

## 2016-09-18 DIAGNOSIS — R591 Generalized enlarged lymph nodes: Secondary | ICD-10-CM | POA: Diagnosis not present

## 2016-09-18 NOTE — Telephone Encounter (Signed)
Pt advised of pre op date/time and sx date. Sx: 09/20/16 with Dr Stephanie Acre Placement.  Pre op: 09/19/16 between 1-5:00pm--Phone.   Patient made aware to call [phone], between 1-3:00pm the day before surgery, to find out what time to arrive.

## 2016-09-18 NOTE — Progress Notes (Signed)
09/18/2016  Reason for Visit:  Lymphadenopathy, cancer of unknown etiology  History of Present Illness: Sylvia Jones is a 60 y.o. female who presents with a recent history of lymphadenopathy with biopsy proven carcinoma, though of unknown primary.  The patient reports she started having left groin pain in June 2017 and after further workup was noted to have a pubic symphysis fracture.  Then she noted swollen lymph nodes on her neck and CT scans have revealed lymphadenopathy in the neck, abdomen, and pelvis.  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  Staining has pointed toward possible breast primary, but she had a negative screening mammogram in June and just recently had a negative breast MRI.  She was started on chemotherapy last week and was referred to Surgery for port placement.  Her next round of chemotherapy is 09/29/16.   Past Medical History: Past Medical History:  Diagnosis Date  . Arthritis   . Cancer ([place])    lymph nodes, in process of diagnosing  . Cervical disc disease    upper cervical, denied numbness / tingling BUE  . Fracture 04/2016   left pubis bone-atypical   . Lymphadenopathy 09/05/2016   R clavical region  . Neuromuscular disorder ([place])    compressed disc in neck when 60 or 60 years old  . Primary cancer of unknown site [facility])   . Tubular adenoma      Past Surgical History: Past Surgical History:  Procedure Laterality Date  . APPENDECTOMY  1986  . BREAST BIOPSY Right    at age 15  . CESAREAN SECTION  1988  . CESAREAN SECTION  1988  . COLONOSCOPY WITH PROPOFOL N/A 07/12/2015   Procedure: COLONOSCOPY WITH PROPOFOL;  Surgeon: Manya Silvas, MD;  Location: [facility] ENDOSCOPY;  Service: Endoscopy;  Laterality: N/A;  . EXPLORATORY LAPAROTOMY  1986  . MASS BIOPSY N/A 09/05/2016   Procedure: NECK MASS BIOPSY;  Surgeon: Beverly Gust, MD;  Location: [facility] ORS;   Service: ENT;  Laterality: N/A;  . MASS EXCISION  1986   to remove mass outside the uterus. procedure was combined tacked uterus, reposition of fallopian tubes   . THORACOTOMY Right 03/2008   RLL Wedge Resection- Dr. Genevive Bi    Home Medications: Prior to Admission medications   Medication Sig Start Date End Date Taking? Authorizing Provider  acetaminophen (TYLENOL) 325 MG tablet Take 650 mg by mouth every 6 (six) hours as needed.   Yes Historical Provider, MD  cyclobenzaprine (FLEXERIL) 10 MG tablet Take 5 mg by mouth 3 (three) times daily as needed for muscle spasms.  03/08/16  Yes Historical Provider, MD  docusate sodium (COLACE) 100 MG capsule Take 100 mg by mouth.   Yes Historical Provider, MD  lactulose (CEPHULAC) 10 g packet Take 10 grams three times daily for the next 2 days. Then 10 grams daily, can increase to 10 grams three times a day for future concerns for worsening constipation 09/12/16  Yes Nance Pear, MD  Multiple Vitamin (MULTIVITAMIN) tablet Take 1 tablet by mouth daily.   Yes Historical Provider, MD  naproxen sodium (ANAPROX) 220 MG tablet Take 220 mg by mouth daily as needed.    Yes Historical Provider, MD  ondansetron (ZOFRAN) 8 MG tablet Take 1 tablet (8 mg total) by mouth 2 (two) times daily as needed for refractory nausea / vomiting. Start on day 3 after chemo. 09/07/16  Yes Cammie Sickle, MD  oxyCODONE-acetaminophen (ROXICET) 5-325 MG tablet Take 1-2 tablets by mouth every 4 (four) hours as needed for severe pain. 09/05/16  Yes Beverly Gust, MD  potassium chloride SA (K-DUR,KLOR-CON) 20 MEQ tablet Take 1 tablet (20 mEq total) by mouth 2 (two) times daily. X 14 days 09/08/16  Yes Cammie Sickle, MD  prochlorperazine (COMPAZINE) 10 MG tablet Take 1 tablet (10 mg total) by mouth every 6 (six) hours as needed (Nausea or vomiting). 09/07/16  Yes Cammie Sickle, MD  traMADol (ULTRAM) 50 MG tablet Take 50 mg by mouth every 6 (six) hours as needed.  06/30/16   Yes Historical Provider, MD    Allergies: Allergies  Allergen Reactions  . Sulfa Antibiotics Hives    Social History:  reports that she has never smoked. She has never used smokeless tobacco. She reports that she does not drink alcohol or use drugs.   Family History: Family History  Problem Relation Age of Onset  . Cancer Father   . Heart attack Father   . Hypertension Brother   . Diabetes Maternal Grandmother   . Diabetes Paternal Grandmother   . Cancer Brother     Review of Systems: Review of Systems  Constitutional: Negative for chills and fever.  Eyes: Negative for blurred vision.  Respiratory: Negative for shortness of breath.   Cardiovascular: Negative for leg swelling.  Gastrointestinal: Positive for constipation. Negative for abdominal pain, heartburn, nausea and vomiting.  Genitourinary: Negative for dysuria and hematuria.  Musculoskeletal: Myalgias: left hip pain from pubic symphysis fracture.  Skin: Negative for rash.  Neurological: Negative for dizziness and headaches.  Psychiatric/Behavioral: Negative for depression.    Physical Exam BP 114/73   Pulse 94   Temp 98.3 F (36.8 C) (Oral)   Ht 5\' 5"  (1.651 m)   Wt 56.7 kg (125 lb)   LMP  (Approximate) Comment: 2012  BMI 20.80 kg/m  CONSTITUTIONAL: No acute distress HEENT:  Normocephalic, atraumatic, extraocular motion intact. NECK: Trachea is midline, and there is no jugular venous distension. LYMPH NODES:  Lymph nodes in the neck are enlarged.  Patient has a right sided recent scar from open excisional biopsy with ENT, healing well. RESPIRATORY:  Lungs are clear, and breath sounds are equal bilaterally. Normal respiratory effort without pathologic use of accessory muscles. CARDIOVASCULAR: Heart is regular without murmurs, gallops, or rubs. GI: The abdomen is soft, nondistended, nontender.. There were no palpable masses. There was no hepatosplenomegaly. MUSCULOSKELETAL:  Normal muscle strength and tone  in all four extremities.  No peripheral edema or cyanosis. SKIN: Skin turgor is normal. There are no pathologic skin lesions.  NEUROLOGIC:  Motor and sensation is grossly normal.  Cranial nerves are grossly intact. PSYCH:  Alert and oriented to person, place and time. Affect is normal.  Laboratory Analysis: No results found for this or any previous visit (from the past 24 hour(s)).  Imaging: No results found.  Assessment and Plan: This is a 60 y.o. female who presents with lymphadenopathy in neck, abdomen, and pelvis, with biopsy proven poorly differentiated carcinoma, though currently of unknown primary source.  The patient has recently started chemotherapy with Oncology team, leaning towards a possible breast primary source.  MRI of bilateral breast done yesterday however does not reveal any lesions/masses.  Nevertheless, the patient will require chemotherapy and needs a port-a-cath placed.  The patient is right-handed and given the recent excisional biopsy on the right neck/upper chest, will proceed with a left sided port placement.  Will schedule the patient for  the OR for this week.  The risks of the procedure have been explained to the patient, including bleeding, infection, injury to surrounding structures, pneumothorax, hemothorax, need for further procedures.  She is willing to proceed.  Patient understands this plan and all of her questions were answered.   Melvyn Neth, [place]

## 2016-09-18 NOTE — Patient Instructions (Addendum)
We have scheduled your port for 09/20/16 with Dr. Hampton Abbot at [facility]. Please see your [facility]) Pre-care sheet for further information.  Implanted [facility] Guide An implanted port is a type of central line that is placed under the skin. Central lines are used to provide IV access when treatment or nutrition needs to be given through a person's veins. Implanted ports are used for long-term IV access. An implanted port may be placed because:   You need IV medicine that would be irritating to the small veins in your hands or arms.   You need long-term IV medicines, such as antibiotics.   You need IV nutrition for a long period.   You need frequent blood draws for lab tests.   You need dialysis.  Implanted ports are usually placed in the chest area, but they can also be placed in the upper arm, the abdomen, or the leg. An implanted port has two main parts:   Reservoir. The reservoir is round and will appear as a small, raised area under your skin. The reservoir is the part where a needle is inserted to give medicines or draw blood.   Catheter. The catheter is a thin, flexible tube that extends from the reservoir. The catheter is placed into a large vein. Medicine that is inserted into the reservoir goes into the catheter and then into the vein.  HOW WILL I CARE FOR MY INCISION SITE? Do not get the incision site wet. Bathe or shower as directed by your health care provider.  HOW IS MY PORT ACCESSED? Special steps must be taken to access the port:   Before the port is accessed, a numbing cream can be placed on the skin. This helps numb the skin over the port site.   Your health care provider uses a sterile technique to access the port.  Your health care provider must put on a mask and sterile gloves.  The skin over your port is cleaned carefully with an antiseptic and allowed to dry.  The port is gently pinched between sterile gloves, and a needle is inserted into the port.  Only  "non-coring" port needles should be used to access the port. Once the port is accessed, a blood return should be checked. This helps ensure that the port is in the vein and is not clogged.   If your port needs to remain accessed for a constant infusion, a clear (transparent) bandage will be placed over the needle site. The bandage and needle will need to be changed every week, or as directed by your health care provider.   Keep the bandage covering the needle clean and dry. Do not get it wet. Follow your health care provider's instructions on how to take a shower or bath while the port is accessed.   If your port does not need to stay accessed, no bandage is needed over the port.  WHAT IS FLUSHING? Flushing helps keep the port from getting clogged. Follow your health care provider's instructions on how and when to flush the port. Ports are usually flushed with saline solution or a medicine called heparin. The need for flushing will depend on how the port is used.   If the port is used for intermittent medicines or blood draws, the port will need to be flushed:   After medicines have been given.   After blood has been drawn.   As part of routine maintenance.   If a constant infusion is running, the port may not need to  be flushed.  HOW LONG WILL MY PORT STAY IMPLANTED? The port can stay in for as long as your health care provider thinks it is needed. When it is time for the port to come out, surgery will be done to remove it. The procedure is similar to the one performed when the port was put in.  WHEN SHOULD I SEEK IMMEDIATE MEDICAL CARE? When you have an implanted port, you should seek immediate medical care if:   You notice a bad smell coming from the incision site.   You have swelling, redness, or drainage at the incision site.   You have more swelling or pain at the port site or the surrounding area.   You have a fever that is not controlled with medicine.   This  information is not intended to replace advice given to you by your health care provider. Make sure you discuss any questions you have with your health care provider.   Document Released: 11/13/2005 Document Revised: 09/03/2013 Document Reviewed: 07/21/2013 Elsevier Interactive Patient Education [employer].

## 2016-09-19 ENCOUNTER — Encounter
Admission: RE | Admit: 2016-09-19 | Discharge: 2016-09-19 | Disposition: A | Discharge status: Home / Self Care | Payer: 59 | Source: Ambulatory Visit | Attending: Surgery | Admitting: Surgery

## 2016-09-19 ENCOUNTER — Encounter: Payer: Self-pay | Admitting: Unknown Physician Specialty

## 2016-09-19 ENCOUNTER — Ambulatory Visit: Payer: 59 | Admitting: Physical Therapy

## 2016-09-19 DIAGNOSIS — C801 Malignant (primary) neoplasm, unspecified: Secondary | ICD-10-CM | POA: Diagnosis not present

## 2016-09-19 NOTE — Patient Instructions (Signed)
  Your procedure is scheduled on: 09/20/16 Report to Day Surgery. Medical mall second floor To find out your arrival time please call [phone] between 1PM - 3PM on10/24/17.  Remember: Instructions that are not followed completely may result in serious medical risk, up to and including death, or upon the discretion of your surgeon and anesthesiologist your surgery may need to be rescheduled.    _x___ 1. Do not eat food or drink liquids after midnight. No gum chewing or hard candies.     ____ 2. No Alcohol for 24 hours before or after surgery.   ____ 3. Do Not Smoke For 24 Hours Prior to Your Surgery.   ____ 4. Bring all medications with you on the day of surgery if instructed.    ___x_ 5. Notify your doctor if there is any change in your medical condition     (cold, fever, infections).       Do not wear jewelry, make-up, hairpins, clips or nail polish.  Do not wear lotions, powders, or perfumes. You may wear deodorant.  Do not shave 48 hours prior to surgery. Men may shave face and neck.  Do not bring valuables to the hospital.    [facility] is not responsible for any belongings or valuables.               Contacts, dentures or bridgework may not be worn into surgery.  Leave your suitcase in the car. After surgery it may be brought to your room.  For patients admitted to the hospital, discharge time is determined by your                treatment team.   Patients discharged the day of surgery will not be allowed to drive home.     ____ Take these medicines the morning of surgery with A SIP OF WATER:    1.   2.   3.   4.  5.  6.  ____ Fleet Enema (as directed)   ____ Use CHG Soap as directed  ____ Use inhalers on the day of surgery  ____ Stop metformin 2 days prior to surgery    ____ Take 1/2 of usual insulin dose the night before surgery and none on the morning of surgery.   ____ Stop Coumadin/Plavix/aspirin on   __x__ Stop Anti-inflammatories on     Already  stopped   ____ Stop supplements until after surgery.    ____ Bring C-Pap to the hospital.

## 2016-09-20 ENCOUNTER — Ambulatory Visit: Payer: 59 | Admitting: Registered Nurse

## 2016-09-20 ENCOUNTER — Encounter: Payer: Self-pay | Admitting: *Deleted

## 2016-09-20 ENCOUNTER — Encounter
Admission: RE | Disposition: A | Discharge status: Home / Self Care | Payer: Self-pay | Source: Ambulatory Visit | Attending: Surgery

## 2016-09-20 ENCOUNTER — Ambulatory Visit: Payer: 59

## 2016-09-20 ENCOUNTER — Ambulatory Visit
Admission: RE | Admit: 2016-09-20 | Discharge: 2016-09-20 | Disposition: A | Discharge status: Home / Self Care | Payer: 59 | Source: Ambulatory Visit | Attending: Surgery | Admitting: Surgery

## 2016-09-20 DIAGNOSIS — Z8249 Family history of ischemic heart disease and other diseases of the circulatory system: Secondary | ICD-10-CM | POA: Insufficient documentation

## 2016-09-20 DIAGNOSIS — M1990 Unspecified osteoarthritis, unspecified site: Secondary | ICD-10-CM | POA: Diagnosis not present

## 2016-09-20 DIAGNOSIS — Z809 Family history of malignant neoplasm, unspecified: Secondary | ICD-10-CM | POA: Insufficient documentation

## 2016-09-20 DIAGNOSIS — Z833 Family history of diabetes mellitus: Secondary | ICD-10-CM | POA: Diagnosis not present

## 2016-09-20 DIAGNOSIS — C50912 Malignant neoplasm of unspecified site of left female breast: Secondary | ICD-10-CM | POA: Diagnosis not present

## 2016-09-20 DIAGNOSIS — R591 Generalized enlarged lymph nodes: Secondary | ICD-10-CM | POA: Diagnosis not present

## 2016-09-20 DIAGNOSIS — C778 Secondary and unspecified malignant neoplasm of lymph nodes of multiple regions: Secondary | ICD-10-CM | POA: Insufficient documentation

## 2016-09-20 DIAGNOSIS — Z452 Encounter for adjustment and management of vascular access device: Secondary | ICD-10-CM | POA: Diagnosis not present

## 2016-09-20 DIAGNOSIS — Z95828 Presence of other vascular implants and grafts: Secondary | ICD-10-CM

## 2016-09-20 DIAGNOSIS — C801 Malignant (primary) neoplasm, unspecified: Secondary | ICD-10-CM | POA: Diagnosis not present

## 2016-09-20 HISTORY — PX: PORTACATH PLACEMENT: SHX2246

## 2016-09-20 LAB — POCT I-STAT 4, (NA,K, GLUC, HGB,HCT)
Glucose, Bld: 98 mg/dL (ref 65–99)
HCT: 40 % (ref 36.0–46.0)
Hemoglobin: 13.6 g/dL (ref 12.0–15.0)
Potassium: 3 mmol/L — ABNORMAL LOW (ref 3.5–5.1)
Sodium: 141 mmol/L (ref 135–145)

## 2016-09-20 SURGERY — INSERTION, TUNNELED CENTRAL VENOUS DEVICE, WITH PORT
Anesthesia: Monitor Anesthesia Care | Site: Chest | Laterality: Left | Wound class: Clean

## 2016-09-20 MED ORDER — LIDOCAINE HCL (CARDIAC) 20 MG/ML IV SOLN
INTRAVENOUS | Status: DC | PRN
  Administered 2016-09-20: 40 mg via INTRAVENOUS

## 2016-09-20 MED ORDER — BUPIVACAINE HCL (PF) 0.5 % IJ SOLN
INTRAMUSCULAR | Status: AC
  Filled 2016-09-20: qty 30

## 2016-09-20 MED ORDER — FAMOTIDINE 20 MG PO TABS
ORAL_TABLET | ORAL | Status: AC
  Filled 2016-09-20: qty 1

## 2016-09-20 MED ORDER — ONDANSETRON HCL 4 MG/2ML IJ SOLN: 4.0000 mg | Freq: Once | INTRAMUSCULAR | Status: DC | PRN

## 2016-09-20 MED ORDER — FAMOTIDINE 20 MG PO TABS
20.0000 mg | ORAL_TABLET | Freq: Once | ORAL | Status: AC
  Administered 2016-09-20: 20 mg via ORAL

## 2016-09-20 MED ORDER — BUPIVACAINE HCL (PF) 0.5 % IJ SOLN
INTRAMUSCULAR | Status: DC | PRN
  Administered 2016-09-20: 4.5 mL

## 2016-09-20 MED ORDER — PROPOFOL 10 MG/ML IV BOLUS
INTRAVENOUS | Status: DC | PRN
  Administered 2016-09-20 (×2): 20 mg via INTRAVENOUS

## 2016-09-20 MED ORDER — CEFAZOLIN SODIUM-DEXTROSE 2-4 GM/100ML-% IV SOLN
2.0000 g | INTRAVENOUS | Status: AC
  Administered 2016-09-20: 2 g via INTRAVENOUS

## 2016-09-20 MED ORDER — FENTANYL CITRATE (PF) 100 MCG/2ML IJ SOLN: 25.0000 ug | INTRAMUSCULAR | Status: DC | PRN

## 2016-09-20 MED ORDER — ACETAMINOPHEN 500 MG PO TABS
ORAL_TABLET | ORAL | Status: AC
  Administered 2016-09-20: 500 mg
  Filled 2016-09-20: qty 2

## 2016-09-20 MED ORDER — PROPOFOL 500 MG/50ML IV EMUL
INTRAVENOUS | Status: DC | PRN
  Administered 2016-09-20: 50 ug/kg/min via INTRAVENOUS

## 2016-09-20 MED ORDER — ONDANSETRON HCL 4 MG/2ML IJ SOLN
INTRAMUSCULAR | Status: DC | PRN
  Administered 2016-09-20: 4 mg via INTRAVENOUS

## 2016-09-20 MED ORDER — CEFAZOLIN SODIUM-DEXTROSE 2-4 GM/100ML-% IV SOLN
INTRAVENOUS | Status: AC
  Filled 2016-09-20: qty 100

## 2016-09-20 MED ORDER — FENTANYL CITRATE (PF) 100 MCG/2ML IJ SOLN
INTRAMUSCULAR | Status: DC | PRN
  Administered 2016-09-20: 12.5 ug via INTRAVENOUS
  Administered 2016-09-20: 25 ug via INTRAVENOUS
  Administered 2016-09-20 (×3): 12.5 ug via INTRAVENOUS

## 2016-09-20 MED ORDER — LACTATED RINGERS IV SOLN
INTRAVENOUS | Status: DC
Start: 2016-09-20 — End: 2016-09-20
  Administered 2016-09-20: 10:00:00 via INTRAVENOUS

## 2016-09-20 MED ORDER — IBUPROFEN 600 MG PO TABS: 600.0000 mg | ORAL_TABLET | Freq: Three times a day (TID) | ORAL | 0 refills | Status: DC | PRN

## 2016-09-20 MED ORDER — HEPARIN SODIUM (PORCINE) 1000 UNIT/ML IJ SOLN
INTRAMUSCULAR | Status: DC | PRN
  Administered 2016-09-20: 6 mL via INTRAMUSCULAR

## 2016-09-20 MED ORDER — CHLORHEXIDINE GLUCONATE CLOTH 2 % EX PADS: 6.0000 | MEDICATED_PAD | Freq: Once | CUTANEOUS | Status: DC

## 2016-09-20 MED ORDER — HYDROCODONE-ACETAMINOPHEN 5-325 MG PO TABS: 1.0000 | ORAL_TABLET | Freq: Four times a day (QID) | ORAL | 0 refills | Status: DC | PRN

## 2016-09-20 MED ORDER — SODIUM CHLORIDE 0.9 % IJ SOLN
INTRAMUSCULAR | Status: AC
  Filled 2016-09-20: qty 50

## 2016-09-20 MED ORDER — MIDAZOLAM HCL 2 MG/2ML IJ SOLN
INTRAMUSCULAR | Status: DC | PRN
  Administered 2016-09-20: 2 mg via INTRAVENOUS

## 2016-09-20 MED ORDER — LIDOCAINE HCL (PF) 1 % IJ SOLN
INTRAMUSCULAR | Status: AC
  Filled 2016-09-20: qty 30

## 2016-09-20 MED ORDER — HEPARIN SODIUM (PORCINE) 5000 UNIT/ML IJ SOLN
INTRAMUSCULAR | Status: AC
  Filled 2016-09-20: qty 1

## 2016-09-20 MED ORDER — LIDOCAINE HCL (PF) 1 % IJ SOLN: INTRAMUSCULAR | Status: DC | PRN

## 2016-09-20 MED ORDER — LIDOCAINE HCL (PF) 1 % IJ SOLN
INTRAMUSCULAR | Status: DC | PRN
  Administered 2016-09-20: 4.5 mL

## 2016-09-20 MED ORDER — ACETAMINOPHEN 500 MG PO TABS
1000.0000 mg | ORAL_TABLET | ORAL | Status: AC
  Administered 2016-09-20: 500 mg via ORAL

## 2016-09-20 SURGICAL SUPPLY — 28 items
CANISTER SUCT 1200ML W/VALVE (MISCELLANEOUS) ×3 IMPLANT
CHLORAPREP W/TINT 26ML (MISCELLANEOUS) ×3 IMPLANT
COVER LIGHT HANDLE STERIS (MISCELLANEOUS) ×6 IMPLANT
DECANTER SPIKE VIAL GLASS SM (MISCELLANEOUS) ×3 IMPLANT
DERMABOND ADVANCED (GAUZE/BANDAGES/DRESSINGS) ×2
DERMABOND ADVANCED .7 DNX12 (GAUZE/BANDAGES/DRESSINGS) ×1 IMPLANT
DRAPE C-ARM XRAY 36X54 (DRAPES) ×3 IMPLANT
ELECT REM PT RETURN 9FT ADLT (ELECTROSURGICAL) ×3
ELECTRODE REM PT RTRN 9FT ADLT (ELECTROSURGICAL) ×1 IMPLANT
GLOVE PROTEXIS LATEX SZ 7.5 (GLOVE) ×3 IMPLANT
GLOVE SURG SYN 7.0 (GLOVE) ×9 IMPLANT
GOWN STRL REUS W/ TWL LRG LVL3 (GOWN DISPOSABLE) ×2 IMPLANT
GOWN STRL REUS W/TWL LRG LVL3 (GOWN DISPOSABLE) ×4
KIT PORT POWER 8FR ISP CVUE (Catheter) ×3 IMPLANT
KIT RM TURNOVER STRD PROC AR (KITS) ×3 IMPLANT
LABEL OR SOLS (LABEL) ×3 IMPLANT
NDL SAFETY 22GX1.5 (NEEDLE) ×3 IMPLANT
NEEDLE FILTER BLUNT 18X 1/2SAF (NEEDLE) ×2
NEEDLE FILTER BLUNT 18X1 1/2 (NEEDLE) ×1 IMPLANT
NS IRRIG 500ML POUR BTL (IV SOLUTION) ×3 IMPLANT
PACK PORT-A-CATH (MISCELLANEOUS) ×3 IMPLANT
SUT MNCRL AB 4-0 PS2 18 (SUTURE) ×3 IMPLANT
SUT PROLENE 2 0 SH DA (SUTURE) IMPLANT
SUT PROLENE 3 0 SH DA (SUTURE) ×3 IMPLANT
SUT VIC AB 3-0 SH 27 (SUTURE) ×2
SUT VIC AB 3-0 SH 27X BRD (SUTURE) ×1 IMPLANT
SYR 3ML LL SCALE MARK (SYRINGE) ×3 IMPLANT
SYRINGE 10CC LL (SYRINGE) ×3 IMPLANT

## 2016-09-20 NOTE — Transfer of Care (Signed)
Immediate Anesthesia Transfer of Care Note  Patient: Sylvia Jones  Procedure(s) Performed: Procedure(s): INSERTION PORT-A-CATH (Left)  Patient Location: PACU  Anesthesia Type:MAC  Level of Consciousness: awake and alert   Airway & Oxygen Therapy: Patient Spontanous Breathing  Post-op Assessment: Report given to RN  Post vital signs: Reviewed and stable  Last Vitals:  Vitals:   09/20/16 0911  BP: (!) 132/95  Pulse: 92  Resp: 12  Temp: 36.2 C    Last Pain:  Vitals:   09/20/16 0911  TempSrc: Tympanic         Complications: No apparent anesthesia complications

## 2016-09-20 NOTE — Interval H&P Note (Signed)
History and Physical Interval Note:  09/20/2016 9:43 AM  Sylvia Jones  has presented today for surgery, with the diagnosis of BREAST CANCER  The various methods of treatment have been discussed with the patient and family. After consideration of risks, benefits and other options for treatment, the patient has consented to  Procedure(s): INSERTION PORT-A-CATH (Left) as a surgical intervention .  The patient's history has been reviewed, patient examined, no change in status, stable for surgery.  I have reviewed the patient's chart and labs.  Questions were answered to the patient's satisfaction.     Raziah Funnell

## 2016-09-20 NOTE — Discharge Instructions (Signed)
AMBULATORY SURGERY  °DISCHARGE INSTRUCTIONS ° ° °1) The drugs that you were given will stay in your system until tomorrow so for the next 24 hours you should not: ° °A) Drive an automobile °B) Make any legal decisions °C) Drink any alcoholic beverage ° ° °2) You may resume regular meals tomorrow.  Today it is better to start with liquids and gradually work up to solid foods. ° °You may eat anything you prefer, but it is better to start with liquids, then soup and crackers, and gradually work up to solid foods. ° ° °3) Please notify your doctor immediately if you have any unusual bleeding, trouble breathing, redness and pain at the surgery site, drainage, fever, or pain not relieved by medication. ° ° ° °4) Additional Instructions: ° ° ° ° ° ° ° °Please contact your physician with any problems or Same Day Surgery at [phone], Monday through Friday 6 am to 4 pm, or [place] at  Main number at [phone].Implanted Port Home Guide °An implanted port is a type of central line that is placed under the skin. Central lines are used to provide IV access when treatment or nutrition needs to be given through a person's veins. Implanted ports are used for long-term IV access. An implanted port may be placed because:  °· You need IV medicine that would be irritating to the small veins in your hands or arms.   °· You need long-term IV medicines, such as antibiotics.   °· You need IV nutrition for a long period.   °· You need frequent blood draws for lab tests.   °· You need dialysis.   °Implanted ports are usually placed in the chest area, but they can also be placed in the upper arm, the abdomen, or the leg. An implanted port has two main parts:  °· Reservoir. The reservoir is round and will appear as a small, raised area under your skin. The reservoir is the part where a needle is inserted to give medicines or draw blood.   °· Catheter. The catheter is a thin, flexible tube that extends from the reservoir. The  catheter is placed into a large vein. Medicine that is inserted into the reservoir goes into the catheter and then into the vein.   °HOW WILL I CARE FOR MY INCISION SITE? °Do not get the incision site wet. Bathe or shower as directed by your health care provider.  °HOW IS MY PORT ACCESSED? °Special steps must be taken to access the port:  °· Before the port is accessed, a numbing cream can be placed on the skin. This helps numb the skin over the port site.   °· Your health care provider uses a sterile technique to access the port. °¨ Your health care provider must put on a mask and sterile gloves. °¨ The skin over your port is cleaned carefully with an antiseptic and allowed to dry. °¨ The port is gently pinched between sterile gloves, and a needle is inserted into the port. °· Only "non-coring" port needles should be used to access the port. Once the port is accessed, a blood return should be checked. This helps ensure that the port is in the vein and is not clogged.   °· If your port needs to remain accessed for a constant infusion, a clear (transparent) bandage will be placed over the needle site. The bandage and needle will need to be changed every week, or as directed by your health care provider.   °· Keep the bandage covering the needle clean and dry.   Do not get it wet. Follow your health care provider's instructions on how to take a shower or bath while the port is accessed.   °· If your port does not need to stay accessed, no bandage is needed over the port.   °WHAT IS FLUSHING? °Flushing helps keep the port from getting clogged. Follow your health care provider's instructions on how and when to flush the port. Ports are usually flushed with saline solution or a medicine called heparin. The need for flushing will depend on how the port is used.  °· If the port is used for intermittent medicines or blood draws, the port will need to be flushed:   °¨ After medicines have been given.   °¨ After blood has been  drawn.   °¨ As part of routine maintenance.   °· If a constant infusion is running, the port may not need to be flushed.   °HOW LONG WILL MY PORT STAY IMPLANTED? °The port can stay in for as long as your health care provider thinks it is needed. When it is time for the port to come out, surgery will be done to remove it. The procedure is similar to the one performed when the port was put in.  °WHEN SHOULD I SEEK IMMEDIATE MEDICAL CARE? °When you have an implanted port, you should seek immediate medical care if:  °· You notice a bad smell coming from the incision site.   °· You have swelling, redness, or drainage at the incision site.   °· You have more swelling or pain at the port site or the surrounding area.   °· You have a fever that is not controlled with medicine. °  °This information is not intended to replace advice given to you by your health care provider. Make sure you discuss any questions you have with your health care provider. °  °Document Released: 11/13/2005 Document Revised: 09/03/2013 Document Reviewed: 07/21/2013 °Elsevier Interactive Patient Education ©2016 Elsevier Inc. ° °

## 2016-09-20 NOTE — Op Note (Signed)
  Procedure Date:  09/20/2016  Pre-operative Diagnosis:  Lymphadenopathy of the neck abdomen and pelvis  Post-operative Diagnosis: Lymphadenopathy of the neck abdomen and pelvis  Procedure:  Left subclavian port-a-cath placement  Surgeon:  Melvyn Neth, MD  Anesthesia:  General endotracheal  Estimated Blood Loss:  2 ml  Specimens:  None  Complications:  None  Indications for Procedure:  This is a 60 y.o. female who requires a port-a-cath for chemotherapy.  The risks of bleeding, infection, injury to surrounding structures, thrombosis, nonfunction, pneumothorax, hemothorax, and need for further procedures were discussed with the patient and was willing to proceed.  Description of Procedure: The patient was correctly identified in the preoperative area and brought into the operating room.  The patient was placed supine with VTE prophylaxis in place.  Appropriate time-outs were performed.  Anesthesia was induced.  Appropriate antibiotics were infused.  The left chest and neck were prepped and draped in usual sterile fashion. The patient was placed in Trendelenburg position and local anesthetic was infiltrated into the skin and subcutaneous tissues in the anterior chest wall. The large bore needle was placed into the subclavian vein without difficulty and then the Seldinger wire was advanced. Fluoroscopy was utilized to confirm that the Seldinger wire was in the superior vena cava.  An incision was made and a port pocket developed with blunt and electrocautery dissection. The introducer dilator was placed over the Seldinger wire the wire was removed. The previously flushed catheter was placed into the introducer dilator and the peel-away sheath was removed. The catheter length was confirmed and trimmed utilizing fluoroscopy for proper positioning. The catheter was then attached to the previously flushed port. The port was placed into the pocket. The port was held in with 3-0 Prolenes and  flushed for function and heparin locked.  The wound was closed with interrupted 3-0 Vicryl followed by 4-0 subcuticular Monocryl sutures and sealed with DermaBond.  The patient was emerged from anesthesia and extubated and brought to the recovery room for further management.  A chest x-ray was ordered.  The patient tolerated the procedure well and all counts were correct at the end of the case.   Melvyn Neth, MD

## 2016-09-20 NOTE — Anesthesia Preprocedure Evaluation (Signed)
Anesthesia Evaluation  Patient identified by MRN, date of birth, ID band Patient awake    Reviewed: Allergy & Precautions, H&P , NPO status , Patient's Chart, lab work & pertinent test results, reviewed documented beta blocker date and time   Airway Mallampati: II   Neck ROM: full    Dental  (+) Teeth Intact   Pulmonary neg pulmonary ROS,    Pulmonary exam normal        Cardiovascular negative cardio ROS Normal cardiovascular exam Rhythm:regular Rate:Normal     Neuro/Psych  Neuromuscular disease negative neurological ROS  negative psych ROS   GI/Hepatic negative GI ROS, Neg liver ROS,   Endo/Other  negative endocrine ROS  Renal/GU negative Renal ROS  negative genitourinary   Musculoskeletal   Abdominal   Peds  Hematology negative hematology ROS (+)   Anesthesia Other Findings Past Medical History: No date: Arthritis No date: Cancer ([place])     Comment: lymph nodes, in process of diagnosing No date: Cervical disc disease     Comment: upper cervical, denied numbness / tingling BUE 04/2016: Fracture     Comment: left pubis bone-atypical  09/05/2016: Lymphadenopathy     Comment: R clavical region No date: Neuromuscular disorder (H[facility])     Comment: compressed disc in neck when 43 or 60 years               old No date: Primary cancer of unknown site ([place]) No date: Tubular adenoma Past Surgical History: 1986: APPENDECTOMY No date: BREAST BIOPSY Right     Comment: at age [age]: [place]: CESAREAN SECTION 07/12/2015: COLONOSCOPY WITH PROPOFOL N/A     Comment: Procedure: COLONOSCOPY WITH PROPOFOL;                Surgeon: Manya Silvas, MD;  Location: [facility]               ENDOSCOPY;  Service: Endoscopy;  Laterality:               N/A; 1986: EXPLORATORY LAPAROTOMY No date: LYMPH GLAND EXCISION 09/05/2016: MASS BIOPSY N/A     Comment: Procedure: NECK MASS BIOPSY;  Surgeon: Beverly Gust, MD;  Location: [facility] ORS;  Service:               ENT;  Laterality: N/A; 1986: MASS EXCISION     Comment: to remove mass outside the uterus. procedure               was combined tacked uterus, reposition of               fallopian tubes  03/2008: THORACOTOMY Right     Comment: RLL Wedge Resection- Dr. Genevive Bi BMI    Body Mass Index:  20.80 kg/m     Reproductive/Obstetrics                             Anesthesia Physical Anesthesia Plan  ASA: II  Anesthesia Plan: General   Post-op Pain Management:    Induction:   Airway Management Planned:   Additional Equipment:   Intra-op Plan:   Post-operative Plan:   Informed Consent: I have reviewed the patients History and Physical, chart, labs and discussed the procedure including the risks, benefits and alternatives for the proposed anesthesia with the patient or authorized representative who has indicated his/her understanding and acceptance.   Dental Advisory Given  Plan Discussed with: CRNA  Anesthesia Plan Comments:  Anesthesia Quick Evaluation  

## 2016-09-20 NOTE — H&P (View-Only) (Signed)
09/18/2016  Reason for Visit:  Lymphadenopathy, cancer of unknown etiology  History of Present Illness: Sylvia Jones is a 60 y.o. female who presents with a recent history of lymphadenopathy with biopsy proven carcinoma, though of unknown primary.  The patient reports she started having left groin pain in June 2017 and after further workup was noted to have a pubic symphysis fracture.  Then she noted swollen lymph nodes on her neck and CT scans have revealed lymphadenopathy in the neck, abdomen, and pelvis.  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  Staining has pointed toward possible breast primary, but she had a negative screening mammogram in June and just recently had a negative breast MRI.  She was started on chemotherapy last week and was referred to Surgery for port placement.  Her next round of chemotherapy is 09/29/16.   Past Medical History: Past Medical History:  Diagnosis Date  . Arthritis   . Cancer ([place])    lymph nodes, in process of diagnosing  . Cervical disc disease    upper cervical, denied numbness / tingling BUE  . Fracture 04/2016   left pubis bone-atypical   . Lymphadenopathy 09/05/2016   R clavical region  . Neuromuscular disorder ([place])    compressed disc in neck when 16 or 60 years old  . Primary cancer of unknown site [facility])   . Tubular adenoma      Past Surgical History: Past Surgical History:  Procedure Laterality Date  . APPENDECTOMY  1986  . BREAST BIOPSY Right    at age 60  . CESAREAN SECTION  1988  . CESAREAN SECTION  1988  . COLONOSCOPY WITH PROPOFOL N/A 07/12/2015   Procedure: COLONOSCOPY WITH PROPOFOL;  Surgeon: Manya Silvas, MD;  Location: [facility] ENDOSCOPY;  Service: Endoscopy;  Laterality: N/A;  . EXPLORATORY LAPAROTOMY  1986  . MASS BIOPSY N/A 09/05/2016   Procedure: NECK MASS BIOPSY;  Surgeon: Beverly Gust, MD;  Location: [facility] ORS;   Service: ENT;  Laterality: N/A;  . MASS EXCISION  1986   to remove mass outside the uterus. procedure was combined tacked uterus, reposition of fallopian tubes   . THORACOTOMY Right 03/2008   RLL Wedge Resection- Dr. Genevive Bi    Home Medications: Prior to Admission medications   Medication Sig Start Date End Date Taking? Authorizing Provider  acetaminophen (TYLENOL) 325 MG tablet Take 650 mg by mouth every 6 (six) hours as needed.   Yes Historical Provider, MD  cyclobenzaprine (FLEXERIL) 10 MG tablet Take 5 mg by mouth 3 (three) times daily as needed for muscle spasms.  03/08/16  Yes Historical Provider, MD  docusate sodium (COLACE) 100 MG capsule Take 100 mg by mouth.   Yes Historical Provider, MD  lactulose (CEPHULAC) 10 g packet Take 10 grams three times daily for the next 2 days. Then 10 grams daily, can increase to 10 grams three times a day for future concerns for worsening constipation 09/12/16  Yes Nance Pear, MD  Multiple Vitamin (MULTIVITAMIN) tablet Take 1 tablet by mouth daily.   Yes Historical Provider, MD  naproxen sodium (ANAPROX) 220 MG tablet Take 220 mg by mouth daily as needed.    Yes Historical Provider, MD  ondansetron (ZOFRAN) 8 MG tablet Take 1 tablet (8 mg total) by mouth 2 (two) times daily as needed for refractory nausea / vomiting. Start on day 3 after chemo. 09/07/16  Yes Cammie Sickle, MD  oxyCODONE-acetaminophen (ROXICET) 5-325 MG tablet Take 1-2 tablets by mouth every 4 (four) hours as needed for severe pain. 09/05/16  Yes Beverly Gust, MD  potassium chloride SA (K-DUR,KLOR-CON) 20 MEQ tablet Take 1 tablet (20 mEq total) by mouth 2 (two) times daily. X 14 days 09/08/16  Yes Cammie Sickle, MD  prochlorperazine (COMPAZINE) 10 MG tablet Take 1 tablet (10 mg total) by mouth every 6 (six) hours as needed (Nausea or vomiting). 09/07/16  Yes Cammie Sickle, MD  traMADol (ULTRAM) 50 MG tablet Take 50 mg by mouth every 6 (six) hours as needed.  06/30/16   Yes Historical Provider, MD    Allergies: Allergies  Allergen Reactions  . Sulfa Antibiotics Hives    Social History:  reports that she has never smoked. She has never used smokeless tobacco. She reports that she does not drink alcohol or use drugs.   Family History: Family History  Problem Relation Age of Onset  . Cancer Father   . Heart attack Father   . Hypertension Brother   . Diabetes Maternal Grandmother   . Diabetes Paternal Grandmother   . Cancer Brother     Review of Systems: Review of Systems  Constitutional: Negative for chills and fever.  Eyes: Negative for blurred vision.  Respiratory: Negative for shortness of breath.   Cardiovascular: Negative for leg swelling.  Gastrointestinal: Positive for constipation. Negative for abdominal pain, heartburn, nausea and vomiting.  Genitourinary: Negative for dysuria and hematuria.  Musculoskeletal: Myalgias: left hip pain from pubic symphysis fracture.  Skin: Negative for rash.  Neurological: Negative for dizziness and headaches.  Psychiatric/Behavioral: Negative for depression.    Physical Exam BP 114/73   Pulse 94   Temp 98.3 F (36.8 C) (Oral)   Ht 5\' 5"  (1.651 m)   Wt 56.7 kg (125 lb)   LMP  (Approximate) Comment: 2012  BMI 20.80 kg/m  CONSTITUTIONAL: No acute distress HEENT:  Normocephalic, atraumatic, extraocular motion intact. NECK: Trachea is midline, and there is no jugular venous distension. LYMPH NODES:  Lymph nodes in the neck are enlarged.  Patient has a right sided recent scar from open excisional biopsy with ENT, healing well. RESPIRATORY:  Lungs are clear, and breath sounds are equal bilaterally. Normal respiratory effort without pathologic use of accessory muscles. CARDIOVASCULAR: Heart is regular without murmurs, gallops, or rubs. GI: The abdomen is soft, nondistended, nontender.. There were no palpable masses. There was no hepatosplenomegaly. MUSCULOSKELETAL:  Normal muscle strength and tone  in all four extremities.  No peripheral edema or cyanosis. SKIN: Skin turgor is normal. There are no pathologic skin lesions.  NEUROLOGIC:  Motor and sensation is grossly normal.  Cranial nerves are grossly intact. PSYCH:  Alert and oriented to person, place and time. Affect is normal.  Laboratory Analysis: No results found for this or any previous visit (from the past 24 hour(s)).  Imaging: No results found.  Assessment and Plan: This is a 60 y.o. female who presents with lymphadenopathy in neck, abdomen, and pelvis, with biopsy proven poorly differentiated carcinoma, though currently of unknown primary source.  The patient has recently started chemotherapy with Oncology team, leaning towards a possible breast primary source.  MRI of bilateral breast done yesterday however does not reveal any lesions/masses.  Nevertheless, the patient will require chemotherapy and needs a port-a-cath placed.  The patient is right-handed and given the recent excisional biopsy on the right neck/upper chest, will proceed with a left sided port placement.  Will schedule the patient for  the OR for this week.  The risks of the procedure have been explained to the patient, including bleeding, infection, injury to surrounding structures, pneumothorax, hemothorax, need for further procedures.  She is willing to proceed.  Patient understands this plan and all of her questions were answered.   Melvyn Neth, [place]

## 2016-09-22 ENCOUNTER — Encounter: Payer: Self-pay | Admitting: Surgery

## 2016-09-25 ENCOUNTER — Other Ambulatory Visit: Payer: Self-pay | Admitting: Unknown Physician Specialty

## 2016-09-25 NOTE — Anesthesia Postprocedure Evaluation (Signed)
Anesthesia Post Note  Patient: Sylvia Jones  Procedure(s) Performed: Procedure(s) (LRB): INSERTION PORT-A-CATH (Left)  Patient location during evaluation: PACU Anesthesia Type: General Level of consciousness: awake and alert Pain management: pain level controlled Vital Signs Assessment: post-procedure vital signs reviewed and stable Respiratory status: spontaneous breathing, nonlabored ventilation, respiratory function stable and patient connected to nasal cannula oxygen Cardiovascular status: blood pressure returned to baseline and stable Postop Assessment: no signs of nausea or vomiting Anesthetic complications: no    Last Vitals:  Vitals:   09/20/16 1208 09/20/16 1236  BP: 130/71 122/73  Pulse: 74   Resp: 16 16  Temp: 36.6 C     Last Pain:  Vitals:   09/20/16 1208  TempSrc: Oral  PainSc:                  [name]

## 2016-09-26 ENCOUNTER — Encounter: Payer: Self-pay | Admitting: Internal Medicine

## 2016-09-26 ENCOUNTER — Other Ambulatory Visit: Payer: Self-pay | Admitting: Unknown Physician Specialty

## 2016-09-26 DIAGNOSIS — R221 Localized swelling, mass and lump, neck: Secondary | ICD-10-CM

## 2016-09-27 ENCOUNTER — Ambulatory Visit (INDEPENDENT_AMBULATORY_CARE_PROVIDER_SITE_OTHER): Payer: 59 | Admitting: Surgery

## 2016-09-27 ENCOUNTER — Other Ambulatory Visit: Payer: Self-pay | Admitting: *Deleted

## 2016-09-27 ENCOUNTER — Encounter: Payer: Self-pay | Admitting: Surgery

## 2016-09-27 VITALS — BP 133/78 | HR 76 | Temp 98.1°F | Ht 65.0 in | Wt 126.0 lb

## 2016-09-27 DIAGNOSIS — Z95828 Presence of other vascular implants and grafts: Secondary | ICD-10-CM

## 2016-09-27 DIAGNOSIS — C801 Malignant (primary) neoplasm, unspecified: Secondary | ICD-10-CM

## 2016-09-27 DIAGNOSIS — R591 Generalized enlarged lymph nodes: Secondary | ICD-10-CM

## 2016-09-27 MED ORDER — LIDOCAINE-PRILOCAINE 2.5-2.5 % EX CREA: 1.0000 "application " | TOPICAL_CREAM | CUTANEOUS | 6 refills | Status: DC | PRN

## 2016-09-27 NOTE — Progress Notes (Signed)
09/27/2016  HPI: Patient is status post left subclavian Port-A-Cath placement on 10/25. She has a recent history of lymphadenopathy with biopsy proven carcinoma, though of unknown primary. She already underwent one round of chemotherapy and is due for her next round on 11/3. She reports that her left-sided incision has been healing well with no complications.  She does report losing her hair since the first round of chemotherapy but otherwise denies any fevers, chills, chest pain, shortness of breath.  Vital signs: BP 133/78 (BP Location: Left Arm, Patient Position: Sitting)   Pulse 76   Temp 98.1 F (36.7 C) (Oral)   Ht 5\' 5"  (1.651 m)   Wt 57.2 kg (126 lb)   LMP  (Approximate) Comment: 2012  BMI 20.97 kg/m    Physical Exam: Constitutional: No acute distress Skin: Left-sided Port-A-Cath incision is healing well with no evidence of infection. There is no fluid collections. There is minimal ecchymosis.  Assessment/Plan: 60 year old female status post left subclavian Port-A-Cath placement on 10/25.  -Port-A-Cath can be used for her next round of chemotherapy on 11/3. -Patient already got her EMLA prescription that she will apply over the Port-A-Cath site 1 hour prior to her going to the cancer center on 11/3. -Currently her carcinoma is still of unknown primary though there is suspicion for probable breast primary. Once more information is known about the primary, the patient may follow-up with [place] in the future to discuss further surgery if needed.   Melvyn Neth, [place]

## 2016-09-27 NOTE — Patient Instructions (Signed)
Please follow up with your oncology office.   Please call our office with any questions or concerns.  Please do not submerge in a tub, hot tub, or pool until incisions are completely sealed.  Use sun block to incision area over the next year if this area will be exposed to sun. This helps decrease scarring.  You may now resume your normal activities. Listen to your body when lifting, if you have pain when lifting, stop and then try again in a few days.  If you develop redness, drainage, or pain at incision sites- call our office immediately and speak with a nurse.

## 2016-09-28 ENCOUNTER — Encounter: Payer: Self-pay | Admitting: Surgery

## 2016-09-29 ENCOUNTER — Other Ambulatory Visit: Payer: Self-pay | Admitting: *Deleted

## 2016-09-29 ENCOUNTER — Inpatient Hospital Stay: Payer: 59

## 2016-09-29 ENCOUNTER — Inpatient Hospital Stay: Payer: 59 | Attending: Internal Medicine

## 2016-09-29 ENCOUNTER — Inpatient Hospital Stay (HOSPITAL_BASED_OUTPATIENT_CLINIC_OR_DEPARTMENT_OTHER): Payer: 59 | Admitting: Internal Medicine

## 2016-09-29 VITALS — BP 112/73 | HR 87 | Temp 97.1°F | Resp 18 | Wt 127.0 lb

## 2016-09-29 VITALS — BP 130/74 | HR 67 | Temp 97.1°F | Resp 18

## 2016-09-29 DIAGNOSIS — C770 Secondary and unspecified malignant neoplasm of lymph nodes of head, face and neck: Secondary | ICD-10-CM | POA: Insufficient documentation

## 2016-09-29 DIAGNOSIS — Z79899 Other long term (current) drug therapy: Secondary | ICD-10-CM | POA: Diagnosis not present

## 2016-09-29 DIAGNOSIS — C801 Malignant (primary) neoplasm, unspecified: Secondary | ICD-10-CM | POA: Diagnosis not present

## 2016-09-29 DIAGNOSIS — T451X5A Adverse effect of antineoplastic and immunosuppressive drugs, initial encounter: Secondary | ICD-10-CM

## 2016-09-29 DIAGNOSIS — C7951 Secondary malignant neoplasm of bone: Secondary | ICD-10-CM | POA: Insufficient documentation

## 2016-09-29 DIAGNOSIS — R05 Cough: Secondary | ICD-10-CM | POA: Insufficient documentation

## 2016-09-29 DIAGNOSIS — Z23 Encounter for immunization: Secondary | ICD-10-CM

## 2016-09-29 DIAGNOSIS — Z5111 Encounter for antineoplastic chemotherapy: Secondary | ICD-10-CM | POA: Insufficient documentation

## 2016-09-29 DIAGNOSIS — R110 Nausea: Secondary | ICD-10-CM

## 2016-09-29 DIAGNOSIS — K5900 Constipation, unspecified: Secondary | ICD-10-CM

## 2016-09-29 DIAGNOSIS — Z7689 Persons encountering health services in other specified circumstances: Secondary | ICD-10-CM | POA: Insufficient documentation

## 2016-09-29 DIAGNOSIS — Z171 Estrogen receptor negative status [ER-]: Principal | ICD-10-CM

## 2016-09-29 DIAGNOSIS — C50919 Malignant neoplasm of unspecified site of unspecified female breast: Secondary | ICD-10-CM

## 2016-09-29 LAB — CBC WITH DIFFERENTIAL/PLATELET
BASOS ABS: 0.1 10*3/uL (ref 0–0.1)
BASOS PCT: 1 %
EOS PCT: 3 %
Eosinophils Absolute: 0.2 10*3/uL (ref 0–0.7)
HCT: 33.4 % — ABNORMAL LOW (ref 35.0–47.0)
Hemoglobin: 11.7 g/dL — ABNORMAL LOW (ref 12.0–16.0)
Lymphocytes Relative: 15 %
Lymphs Abs: 0.7 10*3/uL — ABNORMAL LOW (ref 1.0–3.6)
MCH: 30.5 pg (ref 26.0–34.0)
MCHC: 35.1 g/dL (ref 32.0–36.0)
MCV: 87 fL (ref 80.0–100.0)
MONO ABS: 0.6 10*3/uL (ref 0.2–0.9)
Monocytes Relative: 12 %
Neutro Abs: 3.2 10*3/uL (ref 1.4–6.5)
Neutrophils Relative %: 69 %
PLATELETS: 294 10*3/uL (ref 150–440)
RBC: 3.84 MIL/uL (ref 3.80–5.20)
RDW: 14.6 % — AB (ref 11.5–14.5)
WBC: 4.7 10*3/uL (ref 3.6–11.0)

## 2016-09-29 LAB — COMPREHENSIVE METABOLIC PANEL
ALBUMIN: 3.7 g/dL (ref 3.5–5.0)
ALT: 29 U/L (ref 14–54)
AST: 40 U/L (ref 15–41)
Alkaline Phosphatase: 119 U/L (ref 38–126)
Anion gap: 5 (ref 5–15)
BUN: 9 mg/dL (ref 6–20)
CHLORIDE: 106 mmol/L (ref 101–111)
CO2: 28 mmol/L (ref 22–32)
Calcium: 9.3 mg/dL (ref 8.9–10.3)
Creatinine, Ser: 0.34 mg/dL — ABNORMAL LOW (ref 0.44–1.00)
GFR calc Af Amer: >60 mL/min (ref >60)
GFR calc non Af Amer: >60 mL/min (ref >60)
GLUCOSE: 108 mg/dL — AB (ref 65–99)
POTASSIUM: 4 mmol/L (ref 3.5–5.1)
SODIUM: 139 mmol/L (ref 135–145)
Total Bilirubin: 0.3 mg/dL (ref 0.3–1.2)
Total Protein: 6.8 g/dL (ref 6.5–8.1)

## 2016-09-29 MED ORDER — PEGFILGRASTIM 6 MG/0.6ML ~~LOC~~ PSKT
6.0000 mg | PREFILLED_SYRINGE | Freq: Once | SUBCUTANEOUS | Status: AC
  Administered 2016-09-29: 6 mg via SUBCUTANEOUS
  Filled 2016-09-29: qty 0.6

## 2016-09-29 MED ORDER — FAMOTIDINE IN NACL 20-0.9 MG/50ML-% IV SOLN
20.0000 mg | Freq: Once | INTRAVENOUS | Status: AC
  Administered 2016-09-29: 20 mg via INTRAVENOUS
  Filled 2016-09-29: qty 50

## 2016-09-29 MED ORDER — PALONOSETRON HCL INJECTION 0.25 MG/5ML
0.2500 mg | Freq: Once | INTRAVENOUS | Status: AC
  Administered 2016-09-29: 0.25 mg via INTRAVENOUS
  Filled 2016-09-29: qty 5

## 2016-09-29 MED ORDER — METHYLPREDNISOLONE SODIUM SUCC 125 MG IJ SOLR
125.0000 mg | Freq: Once | INTRAMUSCULAR | Status: AC | PRN
  Administered 2016-09-29: 125 mg via INTRAVENOUS
  Filled 2016-09-29: qty 2

## 2016-09-29 MED ORDER — INFLUENZA VAC SPLIT QUAD 0.5 ML IM SUSY: 0.5000 mL | PREFILLED_SYRINGE | Freq: Once | INTRAMUSCULAR | Status: DC

## 2016-09-29 MED ORDER — SODIUM CHLORIDE 0.9 % IV SOLN
20.0000 mg | Freq: Once | INTRAVENOUS | Status: AC
  Administered 2016-09-29: 20 mg via INTRAVENOUS
  Filled 2016-09-29: qty 2

## 2016-09-29 MED ORDER — SODIUM CHLORIDE 0.9 % IV SOLN
Freq: Once | INTRAVENOUS | Status: AC
  Administered 2016-09-29: 10:00:00 via INTRAVENOUS
  Filled 2016-09-29: qty 1000

## 2016-09-29 MED ORDER — SODIUM CHLORIDE 0.9 % IV SOLN
556.8000 mg | Freq: Once | INTRAVENOUS | Status: AC
  Administered 2016-09-29: 560 mg via INTRAVENOUS
  Filled 2016-09-29: qty 56

## 2016-09-29 MED ORDER — DIPHENHYDRAMINE HCL 50 MG/ML IJ SOLN
25.0000 mg | Freq: Once | INTRAMUSCULAR | Status: AC | PRN
  Administered 2016-09-29: 25 mg via INTRAVENOUS
  Filled 2016-09-29: qty 1

## 2016-09-29 MED ORDER — DIPHENHYDRAMINE HCL 50 MG/ML IJ SOLN
50.0000 mg | Freq: Once | INTRAMUSCULAR | Status: AC
  Administered 2016-09-29: 50 mg via INTRAVENOUS
  Filled 2016-09-29: qty 1

## 2016-09-29 MED ORDER — HEPARIN SOD (PORK) LOCK FLUSH 100 UNIT/ML IV SOLN
500.0000 [IU] | Freq: Once | INTRAVENOUS | Status: AC
  Administered 2016-09-29: 500 [IU] via INTRAVENOUS
  Filled 2016-09-29: qty 5

## 2016-09-29 MED ORDER — SODIUM CHLORIDE 0.9% FLUSH
10.0000 mL | INTRAVENOUS | Status: DC | PRN
  Administered 2016-09-29: 10 mL via INTRAVENOUS
  Filled 2016-09-29: qty 10

## 2016-09-29 MED ORDER — SODIUM CHLORIDE 0.9 % IV SOLN
200.0000 mg/m2 | Freq: Once | INTRAVENOUS | Status: AC
  Administered 2016-09-29: 324 mg via INTRAVENOUS
  Filled 2016-09-29: qty 54

## 2016-09-29 NOTE — Progress Notes (Signed)
Patient is here for follow up, would like to know the results of the pathology report. What are the next steps to determine what type of cancer she has. She has only been taking one potassium pill daily but will start taking two daily

## 2016-09-29 NOTE — Progress Notes (Signed)
10:53 am At initiation of Taxol within 6 mins patient began feeling funny, flushed warm and tingling. Infusion stopped. MD notified. VS BP 157/91 HR 73 SPO2 97%. Benadry and solumedrol given. Waited 30 minutes and restarted Taxol without any signs of reaction.

## 2016-09-29 NOTE — Progress Notes (Signed)
[place] NOTE  Patient Care Team: Rusty Aus, MD as PCP - General (Internal Medicine)  CHIEF COMPLAINTS/PURPOSE OF CONSULTATION:   Oncology History   # SEP 2017- LYMPHADENOPATHY- extensive- abdominal/RP/ bil neck LN R>L. [place] Bx- POSITIVE for MALIGNANCY; PET- extensive adenopathy  # OCT 2017- ADENO CA/ Ca of Unknown Primary- [ IHC & cancer type ID s/o Breast primary; ca-27-29/ca-125/ca19-9-Elevated; CEA-Normal];  # OCT 13th- CARBO-TAXOL  # Bone mets- X-geva q 6 W  # MOLECULAR TESTING- ER/PR/her 2 NEG; MSI-STABLE. OCT 2017- MRI Bil Breast NEG.   # Lung RUL nodule [benign s/p resection; 2009- Dr.Oaks]  # spring 2017- colonoscopy [Dr.Elliot; polyps]; June mammo-NEG     Primary cancer of unknown site [facility])   08/25/2016 Initial Diagnosis    Primary cancer of unknown site [facility])       Oncology History   # SEP 2017- LYMPHADENOPATHY- extensive- abdominal/RP/ bil neck LN R>L. [place] Bx- POSITIVE for MALIGNANCY; PET- extensive adenopathy  # OCT 2017- ADENO CA/ Ca of Unknown Primary- [ IHC & cancer type ID s/o Breast primary; ca-27-29/ca-125/ca19-9-Elevated; CEA-Normal];  # OCT 13th- CARBO-TAXOL  # Bone mets- X-geva q 6 W  # MOLECULAR TESTING- ER/PR/her 2 NEG; MSI-STABLE. OCT 2017- MRI Bil Breast NEG.   # Lung RUL nodule [benign s/p resection; 2009- Dr.Oaks]  # spring 2017- colonoscopy [Dr.Elliot; polyps]; June mammo-NEG     Primary cancer of unknown site [facility])   08/25/2016 Initial Diagnosis    Primary cancer of unknown site [facility])       HISTORY OF PRESENTING ILLNESS:  Sylvia Jones 60 y.o.  female with a history of carcinoma of unknown primary [possible breast cancer based on IHC] status post cycle #1 Approximately 3 weeks ago.  In the interim patient had a port placed.   Patient feels significantly improved in the last 1 week or so.- Her appetite is good. Pain is improved. Notes that her neck swelling is improved. Constipation improved.   No  tingling and numbness. No significant nausea vomiting. No chest pain or shortness of breath or cough. No fevers or chills.  ROS: A complete 10 point review of system is done which is negative except mentioned above in history of present illness  MEDICAL HISTORY:  Past Medical History:  Diagnosis Date  . Arthritis   . Cancer ([place])    lymph nodes, in process of diagnosing  . Cervical disc disease    upper cervical, denied numbness / tingling BUE  . Fracture 04/2016   left pubis bone-atypical   . Lymphadenopathy 09/05/2016   R clavical region  . Neuromuscular disorder ([place])    compressed disc in neck when 74 or 60 years old  . Primary cancer of unknown site [facility])   . Tubular adenoma     SURGICAL HISTORY: Past Surgical History:  Procedure Laterality Date  . APPENDECTOMY  1986  . BREAST BIOPSY Right    at age 6  . CESAREAN SECTION  1988  . CESAREAN SECTION  1988  . COLONOSCOPY WITH PROPOFOL N/A 07/12/2015   Procedure: COLONOSCOPY WITH PROPOFOL;  Surgeon: Manya Silvas, MD;  Location: [facility] ENDOSCOPY;  Service: Endoscopy;  Laterality: N/A;  . EXPLORATORY LAPAROTOMY  1986  . LYMPH GLAND EXCISION    . MASS BIOPSY N/A 09/05/2016   Procedure: NECK MASS BIOPSY;  Surgeon: Beverly Gust, MD;  Location: [facility] ORS;  Service: ENT;  Laterality: N/A;  . MASS EXCISION  1986   to remove mass outside the  uterus. procedure was combined tacked uterus, reposition of fallopian tubes   . PORTACATH PLACEMENT Left 09/20/2016   Procedure: INSERTION PORT-A-CATH;  Surgeon: Olean Ree, MD;  Location: [facility] ORS;  Service: General;  Laterality: Left;  . THORACOTOMY Right 03/2008   RLL Wedge Resection- Dr. Genevive Bi    SOCIAL HISTORY: lives in [place]; NO smoking/ alcohol- stay home/home schooled son; son-PA in [employer] [facility] Social History   Social History  . Marital status: Married    Spouse name: N/A  . Number of children: N/A  . Years of education: N/A   Occupational History  . Not on file.    Social History Main Topics  . Smoking status: Never Smoker  . Smokeless tobacco: Never Used  . Alcohol use No  . Drug use: No  . Sexual activity: Yes   Other Topics Concern  . Not on file   Social History Narrative  . No narrative on file    FAMILY HISTORY: father- ? Cancer died 30. .. Brother- kasposi sarcoma Family History  Problem Relation Age of Onset  . Cancer Father   . Heart attack Father   . Hypertension Brother   . Diabetes Maternal Grandmother   . Diabetes Paternal Grandmother   . Cancer Brother     ALLERGIES:  is allergic to sulfa antibiotics.  MEDICATIONS:  Current Outpatient Prescriptions  Medication Sig Dispense Refill  . Calcium Carb-Cholecalciferol (CALCIUM 600+D3 PO) Take by mouth.    . lactulose (CEPHULAC) 10 g packet Take 10 grams three times daily for the next 2 days. Then 10 grams daily, can increase to 10 grams three times a day for future concerns for worsening constipation 30 each 0  . polyethylene glycol (MIRALAX / GLYCOLAX) packet Take 17 g by mouth daily.    . potassium chloride SA (K-DUR,KLOR-CON) 20 MEQ tablet Take 1 tablet (20 mEq total) by mouth 2 (two) times daily. X 14 days 28 tablet 0   Current Facility-Administered Medications  Medication Dose Route Frequency Provider Last Rate Last Dose  . Influenza vac split quadrivalent PF (FLUARIX) injection 0.5 mL  0.5 mL Intramuscular Once Cammie Sickle, MD       Facility-Administered Medications Ordered in Other Visits  Medication Dose Route Frequency Provider Last Rate Last Dose  . sodium chloride flush (NS) 0.9 % injection 10 mL  10 mL Intravenous PRN Cammie Sickle, MD   10 mL at 09/29/16 0814      .  PHYSICAL EXAMINATION: ECOG PERFORMANCE STATUS: 1 - Symptomatic but completely ambulatory  Vitals:   09/29/16 0841  BP: 112/73  Pulse: 87  Resp: 18  Temp: 97.1 F (36.2 C)   Filed Weights   09/29/16 0841  Weight: 127 lb (57.6 kg)    GENERAL: Well-nourished  well-developed; Alert, no distress and comfortable.   With husband. Patient is wheelchair. EYES: no pallor or icterus OROPHARYNX: no thrush or ulceration; good dentition  NECK: supple, no masses felt LYMPH:  Matted lymphadenopathy noted in the left side of the neck [improved]; and also lymphadenopathy noted on the right neck posterior neck [left supra-clav- ~2-3cm] LUNGS: clear to auscultation and  No wheeze or crackles HEART/CVS: regular rate & rhythm and no murmurs; No lower extremity edema ABDOMEN: abdomen soft, non-tender and normal bowel sounds Musculoskeletal:no cyanosis of digits and no clubbing  PSYCH: alert & oriented x 3 with fluent speech NEURO: no focal motor/sensory deficits SKIN:  no rashes or significant lesions   LABORATORY DATA:  I have reviewed the  data as listed Lab Results  Component Value Date   WBC 4.7 09/29/2016   HGB 11.7 (L) 09/29/2016   HCT 33.4 (L) 09/29/2016   MCV 87.0 09/29/2016   PLT 294 09/29/2016    Recent Labs  08/25/16 1632  09/07/16 1512 09/12/16 1805 09/14/16 0759 09/20/16 0939 09/29/16 0814  NA 138  < > 137 131* 135 141 139  K 3.2*  < > 3.0* 3.4* 3.1* 3.0* 4.0  CL 103  --  98* 92* 96*  --  106  CO2 27  --  25 32 27  --  28  GLUCOSE 99  < > 109* 105* 103* 98 108*  BUN 7  --  _0 --  9  CREATININE 0.40*  --  0.43* 0.51 0.43*  --  0.34*  CALCIUM 9.5  --  9.3 8.7* 9.6  --  9.3  GFRNONAA >60  --  >60 >60 >60  --  >60  GFRAA >60  --  >60 >60 >60  --  >60  PROT 8.0  --  7.4  --   --   --  6.8  ALBUMIN 4.5  --  4.2  --   --   --  3.7  AST 34  --  89*  --   --   --  40  ALT 23  --  104*  --   --   --  29  ALKPHOS 104  --  131*  --   --   --  119  BILITOT 0.4  --  1.1  --   --   --  0.3  < > = values in this interval not displayed.  RADIOGRAPHIC STUDIES: I have personally reviewed the radiological images as listed and agreed with the findings in the report. Dg Chest 1 View  Result Date: 09/20/2016 CLINICAL DATA:  Left port  placement EXAM: CHEST 1 VIEW COMPARISON:  None. FINDINGS: Left Port-A-Cath in place with the tip in the SVC. No pneumothorax. Heart and mediastinal contours are within normal limits. No focal opacities or effusions. No acute bony abnormality. IMPRESSION: Left port placement as above. No pneumothorax. No active cardiopulmonary disease. Electronically Signed   By: Rolm Baptise M.D.   On: 09/20/2016 11:34   Mr Jeri Cos BP Contrast  Result Date: 09/12/2016 CLINICAL DATA:  Primary cancer of unknown site. Recent fall hit head injury EXAM: MRI HEAD WITHOUT AND WITH CONTRAST TECHNIQUE: Multiplanar, multiecho pulse sequences of the brain and surrounding structures were obtained without and with intravenous contrast. CONTRAST:  25m MULTIHANCE GADOBENATE DIMEGLUMINE 529 MG/ML IV SOLN COMPARISON:  None. FINDINGS: Brain: Ventricle size normal. Cerebral volume normal. Negative for acute infarct. Mild changes in the cerebral white matter consistent with chronic microvascular ischemia. Negative for hemorrhage or fluid collection. Negative for mass or edema. Normal enhancement postcontrast administration. Negative for metastatic disease. Leptomeningeal enhancement normal. Vascular: Normal arterial flow voids. Normal venous enhancement following contrast infusion. Skull and upper cervical spine: Negative Sinuses/Orbits: Negative Other: None IMPRESSION: No acute abnormality. Negative for metastatic disease. Mild chronic microvascular ischemic change in the white matter. Electronically Signed   By: CFranchot GalloM.D.   On: 09/12/2016 15:47   Ct Abdomen Pelvis W Contrast  Result Date: 09/12/2016 CLINICAL DATA:  Subacute onset of constipation. Mid abdominal pain and bloating. Initial encounter. EXAM: CT ABDOMEN AND PELVIS WITH CONTRAST TECHNIQUE: Multidetector CT imaging of the abdomen and pelvis was performed using the standard protocol following bolus administration of  intravenous contrast. CONTRAST:  131m ISOVUE-300  IOPAMIDOL (ISOVUE-300) INJECTION 61% COMPARISON:  PET/CT performed 08/29/2016 FINDINGS: Lower chest: Trace left-sided pleural fluid is noted, with mild bibasilar atelectasis or scarring. The visualized portions of the mediastinum are unremarkable. A small hiatal hernia is noted. Hepatobiliary: The liver is unremarkable in appearance. The gallbladder is unremarkable in appearance. The common bile duct remains normal in caliber. Pancreas: The pancreas is within normal limits. Spleen: The spleen is unremarkable in appearance. Adrenals/Urinary Tract: The adrenal glands are unremarkable in appearance. The kidneys are within normal limits. There is no evidence of hydronephrosis. No renal or ureteral stones are identified. Mild nonspecific perinephric stranding is noted bilaterally. There is mild wall thickening along the course of the left ureter, raising question for left-sided ureteritis. Stomach/Bowel: The stomach is filled with contrast and is otherwise grossly unremarkable. The small bowel is unremarkable in appearance. There is diffuse dilatation of the colon, filled with fluid and air, with a small to moderate amount of stool noted at the rectum. Trace free fluid is noted about the cecum, of uncertain significance. This appears to reflect some degree of constipation and colonic dysmotility. No obstructing mass is seen. The colon was decompressed on the recent prior study. Vascular/Lymphatic: The abdominal aorta is unremarkable in appearance. The inferior vena cava is grossly unremarkable. No retroperitoneal lymphadenopathy is seen. No pelvic sidewall lymphadenopathy is identified. There is diffuse mesenteric edema, with numerous enlarged mesenteric and retroperitoneal nodes, and mild surrounding soft tissue inflammation. This appearance is similar to the prior study, reflecting the patient's known metastatic disease. Reproductive: The bladder is mildly distended and grossly unremarkable. The uterus is grossly  unremarkable in appearance. No suspicious adnexal masses are seen. The ovaries appear relatively symmetric. Other: No additional soft tissue abnormalities are seen. Musculoskeletal: Known bony metastatic disease is better characterized at the left side of the pubic symphysis, with an underlying relatively chronic appearing pathologic fracture line. The visualized musculature is unremarkable in appearance. IMPRESSION: 1. Diffuse dilatation of the colon, filled with fluid and air, and a small to moderate of stool noted at the rectum. Trace free fluid tracking about the cecum. This appears to reflect some degree of constipation and colonic dysmotility. No obstructing mass seen. The colon was decompressed on the recent prior study. Enema or medication such as dulcolax could be considered to relieve constipation, as deemed clinically appropriate. 2. Mild wall thickening along the course of the left ureter, raising question for left-sided ureteritis. 3. Diffuse mesenteric edema, with numerous enlarged mesenteric and retroperitoneal nodes, and mild surrounding soft tissue inflammation. This is similar to the recent prior study, reflecting the patient's known metastatic disease. 4. Known bony metastatic disease is better characterized at the left side of the pubic symphysis, with underlying chronic appearing pathologic fracture line. 5. Trace left-sided pleural fluid, with mild bibasilar atelectasis or scarring. 6. Small hiatal hernia noted. Electronically Signed   By: JGarald BaldingM.D.   On: 09/12/2016 18:06   Mr Breast Bilateral W Wo Contrast  Result Date: 09/18/2016 CLINICAL DATA:  Patient with recent diagnosis carcinoma of unknown primary. EXAM: BILATERAL BREAST MRI WITH AND WITHOUT CONTRAST TECHNIQUE: Multiplanar, multisequence MR images of both breasts were obtained prior to and following the intravenous administration of 11 ml of MultiHance. THREE-DIMENSIONAL MR IMAGE RENDERING ON INDEPENDENT WORKSTATION:  Three-dimensional MR images were rendered by post-processing of the original MR data on an independent workstation. The three-dimensional MR images were interpreted, and findings are reported in the following complete MRI report  for this study. Three dimensional images were evaluated at the independent DynaCad workstation COMPARISON:  Previous exam(s). FINDINGS: Breast composition: c. Heterogeneous fibroglandular tissue. Background parenchymal enhancement: Mild Right breast: No mass or abnormal enhancement. Left breast: No mass or abnormal enhancement. Lymph nodes: Cortically thickened 7 mm left internal mammary lymph node. No enlarged axillary lymph nodes are identified. Ancillary findings:  None. IMPRESSION: No suspicious areas of enhancement identified within the right or left breast. Cortically thickened left internal mammary lymph node, potentially related to metastatic disease of unknown primary. RECOMMENDATION: Annual screening mammography. Treatment plan for carcinoma of unknown primary. BI-RADS CATEGORY  2: Benign. Electronically Signed   By: Lovey Newcomer M.D.   On: 09/18/2016 12:06   US Biopsy  Result Date: 09/04/2016 INDICATION: Abnormal cervical lymphadenopathy EXAM: ULTRASOUND-GUIDED BIOPSY OF A RIGHT CERVICAL LYMPH NODE.  CORE. MEDICATIONS: None. ANESTHESIA/SEDATION: Fentanyl 100 mcg IV; Versed 2 mg IV Moderate Sedation Time:  35 The patient was continuously monitored during the procedure by the interventional radiology nurse under my direct supervision. FLUOROSCOPY TIME:  Fluoroscopy Time:  minutes  seconds ( mGy). COMPLICATIONS: None immediate. PROCEDURE: Informed written consent was obtained from the patient after a thorough discussion of the procedural risks, benefits and alternatives. All questions were addressed. Maximal Sterile Barrier Technique was utilized including caps, mask, sterile gowns, sterile gloves, sterile drape, hand hygiene and skin antiseptic. A timeout was performed prior to  the initiation of the procedure. Initial imaging in the left supraclavicular region demonstrates only very small lymph nodes. Enlarged lymph nodes in the right neck or identified. The right neck was prepped with ChloraPrep in a sterile fashion, and a sterile drape was applied covering the operative field. A sterile gown and sterile gloves were used for the procedure. Under sonographic guidance, 6 18 gauge core biopsies of an enlarged right cervical node were obtain. Final imaging was performed. Patient tolerated the procedure well without complication. Vital sign monitoring by nursing staff during the procedure will continue as patient is in the special procedures unit for post procedure observation. FINDINGS: The images document guide needle placement within the enlarged right cervical lymph node. Post biopsy images demonstrate no hemorrhage. IMPRESSION: Successful ultrasound-guided core biopsy of an enlarged right neck lymph node. Electronically Signed   By: Marybelle Killings M.D.   On: 09/04/2016 10:45   Dg C-arm 1-60 Min-no Report  Result Date: 09/20/2016 CLINICAL DATA: port a cath insertion C-ARM 1-60 MINUTES Fluoroscopy was utilized by the requesting physician.  No radiographic interpretation.    ASSESSMENT & PLAN:   Primary cancer of unknown site [facility]) # Status post excisional lymph node biopsy- positive for carcinoma- question breast primary [based on immunohistochemistry & Cancer type ID]; status post cycle 1 of carbo-taxol-clinical response noted. We'll plan to get a CT scan after 3 cycles.  # Proceed with cycle #2 of chemotherapy today. Labs reviewed- adequate.   # Hypokalemia- K-4/ improved; okay to discontinue Potassium.   # constipation- improved;  miralax/dulcolax  # Skeletal metastases- on X-geva; significant improvement in pain.   # follow up on Nov 27th chemo/labs/X-geva/Tumor markers;labs in 10 days.  # Patient interested in getting a second opinion at [facility]; recommend  [facility]. However, she prefers to keep doing all her treatments at [facility].     Cammie Sickle, MD 09/29/2016 5:44 PM

## 2016-09-29 NOTE — Assessment & Plan Note (Addendum)
#   Status post excisional lymph node biopsy- positive for carcinoma- question breast primary [based on immunohistochemistry & Cancer type ID]; status post cycle 1 of carbo-taxol-clinical response noted. We'll plan to get a CT scan after 3 cycles.  # Proceed with cycle #2 of chemotherapy today. Labs reviewed- adequate.   # Hypokalemia- K-4/ improved; okay to discontinue Potassium.   # constipation- improved;  miralax/dulcolax  # Skeletal metastases- on X-geva; significant improvement in pain.   # follow up on Nov 27th chemo/labs/X-geva/Tumor markers;labs in 10 days.  # Patient interested in getting a second opinion at [facility]; recommend [facility]. However, she prefers to keep doing all her treatments at [facility].

## 2016-10-04 ENCOUNTER — Ambulatory Visit: Payer: 59 | Attending: Orthopedic Surgery | Admitting: Physical Therapy

## 2016-10-04 DIAGNOSIS — M6281 Muscle weakness (generalized): Secondary | ICD-10-CM | POA: Diagnosis not present

## 2016-10-04 DIAGNOSIS — R2689 Other abnormalities of gait and mobility: Secondary | ICD-10-CM | POA: Insufficient documentation

## 2016-10-04 NOTE — Addendum Note (Signed)
Addended by: Jerl Mina on: 10/04/2016 10:35 AM   Modules accepted: Orders

## 2016-10-04 NOTE — Patient Instructions (Addendum)
1. Discontinue clamshells   2. Bridges with L foot more forward to decrease weight bearing on it.  inhale, exhale lift the hips while Engaging arms, midback, head and feet downward to bed -  10 reps x 2 /  2 x day    3. Pelvic tilts   4. Knees to chest w/ towel under thighs  5 breaths   5. Rocked knees side to side   _____________ Throughout the day w/ your lateral scoots and seated marches   6a. Pelvic floor laying or seated:  quicks 10 reps 1 sec squeezes  6b. Pelvic floor long holds laying down:  3 sec, 3 breaths rest ,  3 reps, x 5 x day   PELVIC FLOOR / KEGEL EXERCISES   Pelvic floor/ Kegel exercises are used to strengthen the muscles in the base of your pelvis that are responsible for supporting your pelvic organs and preventing urine/feces leakage. Based on your therapist's recommendations, they can be performed while standing, sitting, or lying down. Imagine pelvic floor area as a diamond with pelvic landmarks: top =pubic bone, bottom tip=tailbone, sides=sitting bones (ischial tuberosities).    Make yourself aware of this muscle group by using these cues while coordinating your breath:  Inhale, feel pelvic floor diamond area lower like hammock towards your feet and ribcage/belly expanding. Pause. Let the exhale naturally and feel your belly sink, abdominal muscles hugging in around you and you may notice the pelvic diamond draws upward towards your head forming a umbrella shape. Give a squeeze during the exhalation like you are stopping the flow of urine. If you are squeezing the buttock muscles, try to give 50% less effort.   Common Errors:  Breath holding: If you are holding your breath, you may be bearing down against your bladder instead of pulling it up. If you belly bulges up while you are squeezing, you are holding your breath. Be sure to breathe gently in and out while exercising. Counting out loud may help you avoid holding your breath.  Accessory muscle use: You  should not see or feel other muscle movement when performing pelvic floor exercises. When done properly, no one can tell that you are performing the exercises. Keep the buttocks, belly and inner thighs relaxed.  Overdoing it: Your muscles can fatigue and stop working for you if you over-exercise. You may actually leak more or feel soreness at the lower abdomen or rectum.  YOUR HOME EXERCISE PROGRAM  LONG HOLDS: Position: on back  Inhale and then exhale. Then squeeze the muscle and count aloud for 3 seconds. Rest with three long breaths. (Be sure to let belly sink in with exhales and not push outward)  Perform 3 repetitions, 5 times/day  SHORT HOLDS: Position: on back, sitting   Inhale and then exhale. Then squeeze the muscle.  (Be sure to let belly sink in with exhales and not push outward)  Perform 10 repetitions, 3  Times/day                      DECREASE DOWNWARD PRESSURE ON  YOUR PELVIC FLOOR, ABDOMINAL, LOW BACK MUSCLES       PRESERVE YOUR PELVIC HEALTH LONG-TERM   ** SQUEEZE pelvic floor BEFORE YOUR SNEEZE, COUGH, LAUGH   ** EXHALE BEFORE YOU RISE AGAINST GRAVITY (lifting, sit to stand, from squat to stand)   ** LOG ROLL OUT OF BED INSTEAD OF CRUNCH/SIT-UP     ___________  Gentle pulling of legs as practiced with husband  1-2 reps at end of day or if feeling the wrapping pain of L hip   ___________ Transfer from rollator from stronger leg to bed.

## 2016-10-05 DIAGNOSIS — C786 Secondary malignant neoplasm of retroperitoneum and peritoneum: Secondary | ICD-10-CM | POA: Diagnosis not present

## 2016-10-05 DIAGNOSIS — C50919 Malignant neoplasm of unspecified site of unspecified female breast: Secondary | ICD-10-CM | POA: Diagnosis not present

## 2016-10-05 DIAGNOSIS — C7989 Secondary malignant neoplasm of other specified sites: Secondary | ICD-10-CM | POA: Diagnosis not present

## 2016-10-05 DIAGNOSIS — C7951 Secondary malignant neoplasm of bone: Secondary | ICD-10-CM | POA: Diagnosis not present

## 2016-10-05 DIAGNOSIS — R590 Localized enlarged lymph nodes: Secondary | ICD-10-CM | POA: Diagnosis not present

## 2016-10-05 NOTE — Therapy (Addendum)
[place] MAIN [facility] SERVICES [address] [place], [place], [address] Phone: [phone]   Fax:  [phone]  Physical Therapy Treatment  Patient Details  Name: Sylvia Jones MRN: [id number] Date of Birth: [birth year] Referring Provider: Dr. Prescott Parma  Encounter Date: 10/04/2016      PT End of Session - 10/05/16 1111    Visit Number 2   Number of Visits 12   Date for PT Re-Evaluation 11/03/16   PT Start Time 1400   PT Stop Time 1510   PT Time Calculation (min) 70 min   Activity Tolerance Patient tolerated treatment well;No increased pain   Behavior During Therapy WFL for tasks assessed/performed      Past Medical History:  Diagnosis Date  . Arthritis   . Cancer ([place])    lymph nodes, in process of diagnosing  . Cervical disc disease    upper cervical, denied numbness / tingling BUE  . Fracture 04/2016   left pubis bone-atypical   . Lymphadenopathy 09/05/2016   R clavical region  . Neuromuscular disorder ([place])    compressed disc in neck when 58 or 60 years old  . Primary cancer of unknown site [facility])   . Tubular adenoma     Past Surgical History:  Procedure Laterality Date  . APPENDECTOMY  1986  . BREAST BIOPSY Right    at age 75  . CESAREAN SECTION  1988  . CESAREAN SECTION  1988  . COLONOSCOPY WITH PROPOFOL N/A 07/12/2015   Procedure: COLONOSCOPY WITH PROPOFOL;  Surgeon: Manya Silvas, MD;  Location: [facility] ENDOSCOPY;  Service: Endoscopy;  Laterality: N/A;  . EXPLORATORY LAPAROTOMY  1986  . LYMPH GLAND EXCISION    . MASS BIOPSY N/A 09/05/2016   Procedure: NECK MASS BIOPSY;  Surgeon: Beverly Gust, MD;  Location: [facility] ORS;  Service: ENT;  Laterality: N/A;  . MASS EXCISION  1986   to remove mass outside the uterus. procedure was combined tacked uterus, reposition of fallopian tubes   . PORTACATH PLACEMENT Left 09/20/2016   Procedure: INSERTION PORT-A-CATH;  Surgeon: Olean Ree, MD;  Location: [facility] ORS;  Service:  General;  Laterality: Left;  . THORACOTOMY Right 03/2008   RLL Wedge Resection- Dr. Genevive Bi    There were no vitals filed for this visit.      Subjective Assessment - 10/04/16 1405    Subjective Following her first visit, pt was able to eliminate her bowels with the prescription of Lactulose from her MD. Pt was able to have daily movements but when she started chemotherapy 3 weeks ago, pt has had diffculty. Pt had her last bowel movement 4 days ago. Pt has started to put weight on her L leg based on her judgement. Pt started having jamming pain in her abdomen that started last week which may be a side effect from CA Tx or she feels it may also have to do with increased physical activity.  Pt has been doing her HEP with seated marching and lateral scooting. Pt had a CT done 09/12/16 which the radiologist showed chronic Fx. Pt can tolerate walking on it with a walker as long she does not over do it. Pt feels unsure of how much to put weight on it. Pt  has not spoken to Dr. Prescott Parma since 08/07/16.  Pt stopped performing the clamshell exercises because she felt pain in the inner thigh/groin area with L LE movements.  [facility] PT Assessment - 10/05/16 1106      Observation/Other Assessments   Observations L groin pain with bridges, no pain with modified bridge, L feet more forward than R, L knee ~110 deg not 90 deg.       Coordination   Gross Motor Movements are Fluid and Coordinated --  pelvic tilt w/ breathing. cued for less effort   Fine Motor Movements are Fluid and Coordinated --  deep core coordination achieved.     PROM   Overall PROM Comments hip ext in sidelying L : 0deg, (postTx: ~5 deg)      Strength   Overall Strength Comments Pt c/o R pubic pain with L hip ext in prone without manual resistance.     Palpation   SI assessment  L PSIS more posterior, long-axis distraction released L hip pain                  Pelvic Floor Special Questions - 10/05/16 1110     External Perineal Exam through clothing. 3 reps, 3 sec holds long holds, Grade 3/5.  10 reps 1 sec holds for quick.            [facility] Adult PT Treatment/Exercise - 10/04/16 0936      Therapeutic Activites    Therapeutic Activities --  see pt instructions                PT Education - 10/05/16 1111    Education provided Yes   Education Details HEP   Person(s) Educated Patient   Methods Explanation;Demonstration;Tactile cues;Verbal cues;Handout   Comprehension Verbalized understanding;Returned demonstration                    Plan - 10/05/16 1112    Clinical Impression Statement Pt has started chemotherapy. Pt has not communicated with Dr. Prescott Parma since Sept and is unsure of her weight bearing status. PT continued to customize exercises today with partial WBing status but PT plans to call MD for update on her [place] restrictions.  Pt tolerated modified bridges with decrased WBing on LLE, pelvic floor strengthening, and deep core strengthening exercises. Pt demo'd proper transverse abdominis and pelvic floor activation which PT anticipates will help pt continue to heal her pubic Fx. Pt c/o R pubic pain with L hip ext in prone without manual resistance which signify pt continues to require exercises in gravity eliminated positions to strengthen the postural mm and abdominopelvic area.     Manual technique addressed pelvic obliquities (L ilium on sacrum hypomobile) and relieved her c/o L hip pain. Knee to chest and lower trunk ex was added to HEP to help with motility to promote improved bowel movements.   Pt arrived in rollator and reports she is moving around better in her home and can get around on her feet as long as she uses use walker for support and does not stay on them too long. Pt required cuing for safe t/f to bed leading with the R side instead of the LLE.   Pt was educated to speak with her MD re: any new Sx that she notices following chemotherapy Tx. Pt  voiced understanding.   Pt continues to benefit from skilled PT.      Rehab Potential Fair   PT Frequency 1x / week   PT Duration 12 weeks   PT Treatment/Interventions Patient/family education;Stair training;Therapeutic activities;Neuromuscular re-education;Therapeutic exercise;Manual lymph drainage;Manual techniques;Scar mobilization;ADLs/Self Care Home Management;Aquatic Therapy;Moist Heat;Functional mobility training   Consulted and Agree  with Plan of Care Patient      Patient will benefit from skilled therapeutic intervention in order to improve the following deficits and impairments:  Decreased safety awareness, Decreased endurance, Decreased range of motion, Decreased coordination, Improper body mechanics, Postural dysfunction, Decreased strength, Decreased activity tolerance, Decreased balance  Visit Diagnosis: No diagnosis found.     Problem List Patient Active Problem List   Diagnosis Date Noted  . Cancer, metastatic to bone ([place]) 09/14/2016  . Chemotherapy-induced nausea 09/07/2016  . Primary cancer of unknown site ([place]) 08/25/2016  . Lymphadenopathy 08/11/2016  . Cervical disc disease 03/08/2016  . Tubular adenoma 03/08/2016    Jerl Mina ,PT, DPT, E-RYT  10/05/2016, 11:37 AM  [place] MAIN [facility] SERVICES [address] [place], [place], [address] Phone: [phone]   Fax:  [phone]  Name: ROZANA GORA MRN: [id number] Date of Birth: 26-Apr-1956

## 2016-10-06 ENCOUNTER — Encounter: Payer: Self-pay | Admitting: Internal Medicine

## 2016-10-06 ENCOUNTER — Other Ambulatory Visit: Payer: Self-pay | Admitting: Internal Medicine

## 2016-10-09 ENCOUNTER — Inpatient Hospital Stay: Payer: 59

## 2016-10-09 DIAGNOSIS — C770 Secondary and unspecified malignant neoplasm of lymph nodes of head, face and neck: Secondary | ICD-10-CM | POA: Diagnosis not present

## 2016-10-09 DIAGNOSIS — R05 Cough: Secondary | ICD-10-CM | POA: Diagnosis not present

## 2016-10-09 DIAGNOSIS — Z5111 Encounter for antineoplastic chemotherapy: Secondary | ICD-10-CM | POA: Diagnosis not present

## 2016-10-09 DIAGNOSIS — C801 Malignant (primary) neoplasm, unspecified: Secondary | ICD-10-CM | POA: Diagnosis not present

## 2016-10-09 DIAGNOSIS — K5900 Constipation, unspecified: Secondary | ICD-10-CM | POA: Diagnosis not present

## 2016-10-09 DIAGNOSIS — Z79899 Other long term (current) drug therapy: Secondary | ICD-10-CM | POA: Diagnosis not present

## 2016-10-09 DIAGNOSIS — Z7689 Persons encountering health services in other specified circumstances: Secondary | ICD-10-CM | POA: Diagnosis not present

## 2016-10-09 DIAGNOSIS — C7951 Secondary malignant neoplasm of bone: Secondary | ICD-10-CM | POA: Diagnosis not present

## 2016-10-09 LAB — BASIC METABOLIC PANEL
Anion gap: 8 (ref 5–15)
BUN: 7 mg/dL (ref 6–20)
CALCIUM: 8.6 mg/dL — AB (ref 8.9–10.3)
CO2: 25 mmol/L (ref 22–32)
CREATININE: 0.39 mg/dL — AB (ref 0.44–1.00)
Chloride: 102 mmol/L (ref 101–111)
GFR calc Af Amer: >60 mL/min (ref >60)
GFR calc non Af Amer: >60 mL/min (ref >60)
GLUCOSE: 111 mg/dL — AB (ref 65–99)
Potassium: 3.7 mmol/L (ref 3.5–5.1)
Sodium: 135 mmol/L (ref 135–145)

## 2016-10-09 LAB — CBC WITH DIFFERENTIAL/PLATELET
Basophils Absolute: 0 10*3/uL (ref 0–0.1)
Basophils Relative: 0 %
Eosinophils Absolute: 0.1 10*3/uL (ref 0–0.7)
Eosinophils Relative: 1 %
HEMATOCRIT: 35.3 % (ref 35.0–47.0)
Hemoglobin: 12.2 g/dL (ref 12.0–16.0)
LYMPHS PCT: 8 %
Lymphs Abs: 0.9 10*3/uL — ABNORMAL LOW (ref 1.0–3.6)
MCH: 30.5 pg (ref 26.0–34.0)
MCHC: 34.5 g/dL (ref 32.0–36.0)
MCV: 88.5 fL (ref 80.0–100.0)
MONO ABS: 0.6 10*3/uL (ref 0.2–0.9)
MONOS PCT: 6 %
NEUTROS ABS: 8.8 10*3/uL — AB (ref 1.4–6.5)
Neutrophils Relative %: 85 %
Platelets: 202 10*3/uL (ref 150–440)
RBC: 3.99 MIL/uL (ref 3.80–5.20)
RDW: 15.3 % — AB (ref 11.5–14.5)
WBC: 10.5 10*3/uL (ref 3.6–11.0)

## 2016-10-10 ENCOUNTER — Telehealth: Payer: Self-pay | Admitting: Internal Medicine

## 2016-10-10 NOTE — Telephone Encounter (Signed)
Heather- the foundation one report is available/scanned on this pt; but the first page of the report which is the most important is missing. Please check. Thanks. Dr.B

## 2016-10-12 ENCOUNTER — Encounter: Payer: 59 | Admitting: Physical Therapy

## 2016-10-12 DIAGNOSIS — C801 Malignant (primary) neoplasm, unspecified: Secondary | ICD-10-CM | POA: Diagnosis not present

## 2016-10-12 DIAGNOSIS — C50919 Malignant neoplasm of unspecified site of unspecified female breast: Secondary | ICD-10-CM | POA: Diagnosis not present

## 2016-10-14 DIAGNOSIS — C801 Malignant (primary) neoplasm, unspecified: Secondary | ICD-10-CM | POA: Diagnosis not present

## 2016-10-17 ENCOUNTER — Encounter: Payer: Self-pay | Admitting: Internal Medicine

## 2016-10-17 ENCOUNTER — Ambulatory Visit: Payer: 59 | Admitting: Physical Therapy

## 2016-10-17 DIAGNOSIS — M6281 Muscle weakness (generalized): Secondary | ICD-10-CM

## 2016-10-17 DIAGNOSIS — R2689 Other abnormalities of gait and mobility: Secondary | ICD-10-CM

## 2016-10-17 NOTE — Patient Instructions (Addendum)
PELVIC FLOOR / KEGEL EXERCISES   Pelvic floor/ Kegel exercises are used to strengthen the muscles in the base of your pelvis that are responsible for supporting your pelvic organs and preventing urine/feces leakage. Based on your therapist's recommendations, they can be performed while standing, sitting, or lying down. Imagine pelvic floor area as a diamond with pelvic landmarks: top =pubic bone, bottom tip=tailbone, sides=sitting bones (ischial tuberosities).    Make yourself aware of this muscle group by using these cues while coordinating your breath:  Inhale, feel pelvic floor diamond area lower like hammock towards your feet and ribcage/belly expanding. Pause. Let the exhale naturally and feel your belly sink, abdominal muscles hugging in around you and you may notice the pelvic diamond draws upward towards your head forming a umbrella shape. Give a squeeze during the exhalation like you are stopping the flow of urine. If you are squeezing the buttock muscles, try to give 50% less effort.   Common Errors:  Breath holding: If you are holding your breath, you may be bearing down against your bladder instead of pulling it up. If you belly bulges up while you are squeezing, you are holding your breath. Be sure to breathe gently in and out while exercising. Counting out loud may help you avoid holding your breath.  Accessory muscle use: You should not see or feel other muscle movement when performing pelvic floor exercises. When done properly, no one can tell that you are performing the exercises. Keep the buttocks, belly and inner thighs relaxed.  Overdoing it: Your muscles can fatigue and stop working for you if you over-exercise. You may actually leak more or feel soreness at the lower abdomen or rectum.  YOUR HOME EXERCISE PROGRAM  LONG HOLDS: Position: on back  Inhale and then exhale. Then squeeze the muscle and count aloud for 3 seconds. Rest with three long breaths. (Be sure to let  belly sink in with exhales and not push outward)  Perform 6 repetitions, 5 times/day  SHORT HOLDS: Position: on back, sitting   Inhale and then exhale. Then squeeze the muscle.  (Be sure to let belly sink in with exhales and not push outward)  Perform 5 repetitions, 5  Times/day                     DECREASE DOWNWARD PRESSURE ON  YOUR PELVIC FLOOR, ABDOMINAL, LOW BACK MUSCLES       PRESERVE YOUR PELVIC HEALTH LONG-TERM   ** SQUEEZE pelvic floor BEFORE YOUR SNEEZE, COUGH, LAUGH   ** EXHALE BEFORE YOU RISE AGAINST GRAVITY (lifting, sit to stand, from squat to stand)   ** LOG ROLL OUT OF BED INSTEAD OF CRUNCH/SIT-UP      ______________  Deep core level 3 , 15 reps x 2 day   Deep core level 4, 15  reps x 2 day    _______________  6 mins of walking with rollator with gentle shoulder blade squeeze and down when hands are on the handle Walk with feet  hip width apart x 2 x/ day  post walking stretch : Crossing ankle over opposite thigh "figure 4"  5 breaths  ________   Standing marching 2 mins x 3 reps with feet  hip width apart    Mini squats 10 reps x 3 days  Exhale with pelvic floor squeeze  ______________  Discontinue bridging and scooting exercise.   ______________ Yoga and tai chi classes at the cancer center (handout)  ______________  Sit to stand , pause, then move feet before turning rather than getting out of chair turning.

## 2016-10-18 ENCOUNTER — Other Ambulatory Visit: Payer: Self-pay | Admitting: *Deleted

## 2016-10-18 ENCOUNTER — Ambulatory Visit
Admission: RE | Admit: 2016-10-18 | Discharge: 2016-10-18 | Disposition: A | Discharge status: Home / Self Care | Payer: 59 | Source: Ambulatory Visit | Attending: Internal Medicine | Admitting: Internal Medicine

## 2016-10-18 ENCOUNTER — Telehealth: Payer: Self-pay | Admitting: Internal Medicine

## 2016-10-18 DIAGNOSIS — R05 Cough: Secondary | ICD-10-CM

## 2016-10-18 DIAGNOSIS — R059 Cough, unspecified: Secondary | ICD-10-CM

## 2016-10-18 DIAGNOSIS — C7951 Secondary malignant neoplasm of bone: Secondary | ICD-10-CM

## 2016-10-18 MED ORDER — AZITHROMYCIN 250 MG PO TABS: ORAL_TABLET | ORAL | 0 refills | Status: DC

## 2016-10-18 NOTE — Therapy (Signed)
[place] MAIN [facility] SERVICES [address] [place], [place], [address] Phone: [phone]   Fax:  [phone]  Physical Therapy Treatment [place] [facility] Note  Patient Details  Name: Sylvia Jones MRN: [id number] Date of Birth: Apr 27, 1956 Referring Provider: Dr. Prescott Parma  Encounter Date: 10/17/2016      PT End of Session - 10/18/16 1213    Visit Number 3   Number of Visits 12   Date for PT Re-Evaluation 11/03/16   PT Start Time 1430   PT Stop Time 1530   PT Time Calculation (min) 60 min   Activity Tolerance Patient tolerated treatment well;No increased pain   Behavior During Therapy WFL for tasks assessed/performed      Past Medical History:  Diagnosis Date  . Arthritis   . Cancer ([place])    lymph nodes, in process of diagnosing  . Cervical disc disease    upper cervical, denied numbness / tingling BUE  . Fracture 04/2016   left pubis bone-atypical   . Lymphadenopathy 09/05/2016   R clavical region  . Neuromuscular disorder ([place])    compressed disc in neck when 12 or 60 years old  . Primary cancer of unknown site [facility])   . Tubular adenoma     Past Surgical History:  Procedure Laterality Date  . APPENDECTOMY  1986  . BREAST BIOPSY Right    at age 24  . CESAREAN SECTION  1988  . CESAREAN SECTION  1988  . COLONOSCOPY WITH PROPOFOL N/A 07/12/2015   Procedure: COLONOSCOPY WITH PROPOFOL;  Surgeon: Manya Silvas, MD;  Location: [facility] ENDOSCOPY;  Service: Endoscopy;  Laterality: N/A;  . EXPLORATORY LAPAROTOMY  1986  . LYMPH GLAND EXCISION    . MASS BIOPSY N/A 09/05/2016   Procedure: NECK MASS BIOPSY;  Surgeon: Beverly Gust, MD;  Location: [facility] ORS;  Service: ENT;  Laterality: N/A;  . MASS EXCISION  1986   to remove mass outside the uterus. procedure was combined tacked uterus, reposition of fallopian tubes   . PORTACATH PLACEMENT Left 09/20/2016   Procedure: INSERTION PORT-A-CATH;  Surgeon: Olean Ree, MD;  Location: [facility]  ORS;  Service: General;  Laterality: Left;  . THORACOTOMY Right 03/2008   RLL Wedge Resection- Dr. Genevive Bi    There were no vitals filed for this visit.      Subjective Assessment - 10/17/16 1450    Subjective Pt reported she went to see an oncologist who informed her that her C7 and rib 1 of the left are both fractured. Pt experiences no pain at these locations. Pt stated her oncologist have Dx her breast CA (triple neg type).  Pt has been walking longer the past week. Pt has been able to take a couple of steps without the walker. Pt reports her bowel movements have occured daily with Miralax. Pt feel an urge to go to eliminate bowels after eating.              [facility] PT Assessment - 10/18/16 1209      Observation/Other Assessments   Observations brigh affect, swift and energetic movements     Posture/Postural Control   Posture Comments minor cues for deep core level 3, pelvic floor strengthening program      Strength   Overall Strength Comments hip abd 4/5 B     Palpation   Palpation comment no deviations at SIJ /pelvic girdle     Ambulation/Gait   Gait Comments narrow BOS w/ report of pulling sensations along lateral thigh. (  cued for wider BOS and decreased pulling sensation was reported)      6 minute walk test results    Aerobic Endurance Distance Walked 940 ft with rollator, minor cues for scap retraction, depression                  Pelvic Floor Special Questions - 10/18/16 1227    External Perineal Exam through clothing   External Palpation pelvic floor endurance: 3 sec, 6 reps.  Seated contractions: 5 reps, 1 sec . Proper coordination in hooklying and seated           [place] Adult PT Treatment/Exercise - 10/18/16 1209      Therapeutic Activites    Therapeutic Activities --  see pt instructions                PT Education - 10/17/16 1525    Education provided Yes   Education Details HEP   Person(s) Educated Patient   Methods  Explanation;Demonstration;Tactile cues;Verbal cues;Handout   Comprehension Returned demonstration;Verbalized understanding             PT Long Term Goals - 10/18/16 1218      PT LONG TERM GOAL #1   Title Pt will demo proper pelvic floor coordination (no straining) with other deep core mm in order to faciliate bowel movements   Time 12   Period Weeks   Status Achieved     PT LONG TERM GOAL #2   Title Pt will demo increased hip abduction strength from 3+/5 B to > 4/5 in order to prepare for full weight bearing and gait/stairclimbing with lowered risks for falls   Time 12   Period Weeks   Status Achieved     PT LONG TERM GOAL #3   Title Pt will be IND with HEP    Time 12   Period Weeks   Status New     PT LONG TERM GOAL #4   Title Pt will report improved stool consistency from Type 1 on Bristol Stool Scale to Type 3-4 in order to improve constipation   Time 12   Period Weeks   Status Achieved     PT LONG TERM GOAL #5   Title Pt will demo no R pubic pain with prone hip ext of L LE and be able to demo 4/5 strength in order to progress to more upright exercises   Time 12   Status New     Additional Long Term Goals   Additional Long Term Goals Yes     PT LONG TERM GOAL #6   Title Pt will be able to walk > 940 ft in 6 MWT without rollator in order to demo increased ability to walk and bear weight through her pelvic girdle and spine to shop and walk    Time 12   Period Weeks   Status New               Plan - 10/18/16 1214    Clinical Impression Statement Pt's pubic pain, hip strength and bowel movements have improved. She has achieved 3/6 goals. Today, pt showed increased tolerance to equal weight bearing with use of rollator, walking 940 ft in 6 MWT. Pt required cues for wider BOS and scapular retriction/ depression to decrease rounded shoulders. Discontinued bridges and scoot exercises due to pt's report of Fx at C7/ rib 1 on left. Modified exercises to minimize  excessive UE movements with consideration of this Fx. Pt demo'd good carry over  with decreased pelvic obliquities and hypomobility compared to less session. Pt is progressing well with her pelvic floor strengthening exercises. Pt will continue to benefit from skilled PT to progress strengthening of hips/BLE and increase equal weight bearing with gait without use of rollator.      Rehab Potential Fair   PT Frequency 1x / week   PT Duration 12 weeks   PT Treatment/Interventions Patient/family education;Stair training;Therapeutic activities;Neuromuscular re-education;Therapeutic exercise;Manual lymph drainage;Manual techniques;Scar mobilization;ADLs/Self Care Home Management;Aquatic Therapy;Moist Heat;Functional mobility training   Consulted and Agree with Plan of Care Patient      Patient will benefit from skilled therapeutic intervention in order to improve the following deficits and impairments:  Decreased safety awareness, Decreased endurance, Decreased range of motion, Decreased coordination, Improper body mechanics, Postural dysfunction, Decreased strength, Decreased activity tolerance, Decreased balance  Visit Diagnosis: Muscle weakness (generalized)  Other abnormalities of gait and mobility     Problem List Patient Active Problem List   Diagnosis Date Noted  . Cancer, metastatic to bone ([place]) 09/14/2016  . Chemotherapy-induced nausea 09/07/2016  . Primary cancer of unknown site ([place]) 08/25/2016  . Lymphadenopathy 08/11/2016  . Cervical disc disease 03/08/2016  . Tubular adenoma 03/08/2016    Jerl Mina ,PT, DPT, E-RYT  10/18/2016, 12:30 PM  [place] MAIN [facility] SERVICES [address] [place], [place], [address] Phone: [phone]   Fax:  [phone]  Name: Sylvia Jones MRN: [id number] Date of Birth: 1956/05/22

## 2016-10-18 NOTE — Telephone Encounter (Signed)
CXR-neg; persistent cough 5-7 days; z-pak prescribed.

## 2016-10-23 ENCOUNTER — Inpatient Hospital Stay (HOSPITAL_BASED_OUTPATIENT_CLINIC_OR_DEPARTMENT_OTHER): Payer: 59 | Admitting: Internal Medicine

## 2016-10-23 ENCOUNTER — Inpatient Hospital Stay: Payer: 59

## 2016-10-23 VITALS — BP 117/72 | HR 80 | Resp 18

## 2016-10-23 VITALS — BP 113/74 | HR 91 | Temp 98.6°F | Resp 18 | Wt 124.6 lb

## 2016-10-23 DIAGNOSIS — C770 Secondary and unspecified malignant neoplasm of lymph nodes of head, face and neck: Secondary | ICD-10-CM

## 2016-10-23 DIAGNOSIS — C801 Malignant (primary) neoplasm, unspecified: Secondary | ICD-10-CM

## 2016-10-23 DIAGNOSIS — R110 Nausea: Secondary | ICD-10-CM

## 2016-10-23 DIAGNOSIS — K5900 Constipation, unspecified: Secondary | ICD-10-CM | POA: Diagnosis not present

## 2016-10-23 DIAGNOSIS — Z79899 Other long term (current) drug therapy: Secondary | ICD-10-CM | POA: Diagnosis not present

## 2016-10-23 DIAGNOSIS — R05 Cough: Secondary | ICD-10-CM | POA: Diagnosis not present

## 2016-10-23 DIAGNOSIS — Z7689 Persons encountering health services in other specified circumstances: Secondary | ICD-10-CM | POA: Diagnosis not present

## 2016-10-23 DIAGNOSIS — C7951 Secondary malignant neoplasm of bone: Secondary | ICD-10-CM

## 2016-10-23 DIAGNOSIS — Z5111 Encounter for antineoplastic chemotherapy: Secondary | ICD-10-CM | POA: Diagnosis not present

## 2016-10-23 DIAGNOSIS — T451X5A Adverse effect of antineoplastic and immunosuppressive drugs, initial encounter: Secondary | ICD-10-CM

## 2016-10-23 LAB — COMPREHENSIVE METABOLIC PANEL
ALBUMIN: 3.8 g/dL (ref 3.5–5.0)
ALK PHOS: 105 U/L (ref 38–126)
ALT: 19 U/L (ref 14–54)
AST: 29 U/L (ref 15–41)
Anion gap: 9 (ref 5–15)
BILIRUBIN TOTAL: 0.4 mg/dL (ref 0.3–1.2)
BUN: 7 mg/dL (ref 6–20)
CALCIUM: 9.2 mg/dL (ref 8.9–10.3)
CO2: 28 mmol/L (ref 22–32)
CREATININE: 0.46 mg/dL (ref 0.44–1.00)
Chloride: 102 mmol/L (ref 101–111)
GFR calc Af Amer: >60 mL/min (ref >60)
GLUCOSE: 105 mg/dL — AB (ref 65–99)
Potassium: 3.4 mmol/L — ABNORMAL LOW (ref 3.5–5.1)
Sodium: 139 mmol/L (ref 135–145)
TOTAL PROTEIN: 7.1 g/dL (ref 6.5–8.1)

## 2016-10-23 LAB — CBC WITH DIFFERENTIAL/PLATELET
Basophils Absolute: 0 10*3/uL (ref 0–0.1)
Basophils Relative: 1 %
EOS ABS: 0.1 10*3/uL (ref 0–0.7)
Eosinophils Relative: 2 %
HEMATOCRIT: 33.1 % — AB (ref 35.0–47.0)
HEMOGLOBIN: 11.6 g/dL — AB (ref 12.0–16.0)
LYMPHS ABS: 0.7 10*3/uL — AB (ref 1.0–3.6)
Lymphocytes Relative: 18 %
MCH: 30.9 pg (ref 26.0–34.0)
MCHC: 35 g/dL (ref 32.0–36.0)
MCV: 88.3 fL (ref 80.0–100.0)
MONO ABS: 0.4 10*3/uL (ref 0.2–0.9)
MONOS PCT: 10 %
NEUTROS PCT: 69 %
Neutro Abs: 2.8 10*3/uL (ref 1.4–6.5)
Platelets: 320 10*3/uL (ref 150–440)
RBC: 3.75 MIL/uL — ABNORMAL LOW (ref 3.80–5.20)
RDW: 16.1 % — AB (ref 11.5–14.5)
WBC: 4.1 10*3/uL (ref 3.6–11.0)

## 2016-10-23 MED ORDER — PALONOSETRON HCL INJECTION 0.25 MG/5ML
0.2500 mg | Freq: Once | INTRAVENOUS | Status: AC
  Administered 2016-10-23: 0.25 mg via INTRAVENOUS
  Filled 2016-10-23: qty 5

## 2016-10-23 MED ORDER — PEGFILGRASTIM 6 MG/0.6ML ~~LOC~~ PSKT
6.0000 mg | PREFILLED_SYRINGE | Freq: Once | SUBCUTANEOUS | Status: AC
  Administered 2016-10-23: 6 mg via SUBCUTANEOUS
  Filled 2016-10-23: qty 0.6

## 2016-10-23 MED ORDER — SODIUM CHLORIDE 0.9 % IV SOLN
Freq: Once | INTRAVENOUS | Status: AC
  Administered 2016-10-23: 10:00:00 via INTRAVENOUS
  Filled 2016-10-23: qty 1000

## 2016-10-23 MED ORDER — SODIUM CHLORIDE 0.9 % IJ SOLN
10.0000 mL | Freq: Once | INTRAMUSCULAR | Status: AC
  Administered 2016-10-23: 10 mL via INTRAVENOUS
  Filled 2016-10-23: qty 10

## 2016-10-23 MED ORDER — FAMOTIDINE IN NACL 20-0.9 MG/50ML-% IV SOLN
20.0000 mg | Freq: Once | INTRAVENOUS | Status: AC
  Administered 2016-10-23: 20 mg via INTRAVENOUS
  Filled 2016-10-23: qty 50

## 2016-10-23 MED ORDER — SODIUM CHLORIDE 0.9 % IV SOLN
556.8000 mg | Freq: Once | INTRAVENOUS | Status: AC
  Administered 2016-10-23: 560 mg via INTRAVENOUS
  Filled 2016-10-23: qty 56

## 2016-10-23 MED ORDER — DIPHENHYDRAMINE HCL 50 MG/ML IJ SOLN
50.0000 mg | Freq: Once | INTRAMUSCULAR | Status: AC
  Administered 2016-10-23: 50 mg via INTRAVENOUS
  Filled 2016-10-23: qty 1

## 2016-10-23 MED ORDER — HEPARIN SOD (PORK) LOCK FLUSH 100 UNIT/ML IV SOLN
500.0000 [IU] | Freq: Once | INTRAVENOUS | Status: AC
  Administered 2016-10-23: 500 [IU] via INTRAVENOUS
  Filled 2016-10-23: qty 5

## 2016-10-23 MED ORDER — SODIUM CHLORIDE 0.9 % IV SOLN
20.0000 mg | Freq: Once | INTRAVENOUS | Status: AC
  Administered 2016-10-23: 20 mg via INTRAVENOUS
  Filled 2016-10-23: qty 2

## 2016-10-23 MED ORDER — SODIUM CHLORIDE 0.9 % IV SOLN
200.0000 mg/m2 | Freq: Once | INTRAVENOUS | Status: AC
  Administered 2016-10-23: 324 mg via INTRAVENOUS
  Filled 2016-10-23: qty 54

## 2016-10-23 NOTE — Assessment & Plan Note (Addendum)
#   Status post excisional lymph node biopsy- positive for carcinoma- question breast primary [based on immunohistochemistry & Cancer type ID]; status post cycle 2 of carbo-taxol-clinical response noted. Plan CT after this cycle- based on respone discussed  Changing to single agent chemotherapy.   # Proceed with cycle #3 of chemotherapy today. Labs reviewed- adequate.   # constipation- improved;  Miralax/dulcolax  # cough- improving; recommend claritin.   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 Plan again in 10 days.   # plan  Follow up in 3 weeks/ cycle # 4; CT ordered today; labs in 10 days/X-geva.

## 2016-10-23 NOTE — Progress Notes (Signed)
Patient is here for follow up, she is doing well still has cough.

## 2016-10-23 NOTE — Progress Notes (Signed)
[place] NOTE  Patient Care Team: Rusty Aus, MD as PCP - General (Internal Medicine)  CHIEF COMPLAINTS/PURPOSE OF CONSULTATION:   Oncology History   # SEP 2017- LYMPHADENOPATHY- extensive- abdominal/RP/ bil neck LN R>L. [place] Bx- POSITIVE for MALIGNANCY; PET- extensive adenopathy  # OCT 2017- ADENO CA/ Ca of Unknown Primary- [ IHC & cancer type ID s/o BREAST PRIMARY; ca-27-29/ca-125/ca19-9-Elevated; CEA-Normal];  # OCT 13th- CARBO-TAXOL  # Bone mets- X-geva q 6 W  # MOLECULAR TESTING- ER/PR/her 2 NEG; MSI-STABLE. OCT 2017- MRI Bil Breast NEG. PDL-1- NEG [0%]- Found One  # Lung RUL nodule [benign s/p resection; 2009- Dr.Oaks]  # spring 2017- colonoscopy [Dr.Elliot; polyps]; June mammo-NEG     Primary cancer of unknown site [facility])   08/25/2016 Initial Diagnosis    Primary cancer of unknown site [facility])       Oncology History   # SEP 2017- LYMPHADENOPATHY- extensive- abdominal/RP/ bil neck LN R>L. [place] Bx- POSITIVE for MALIGNANCY; PET- extensive adenopathy  # OCT 2017- ADENO CA/ Ca of Unknown Primary- [ IHC & cancer type ID s/o BREAST PRIMARY; ca-27-29/ca-125/ca19-9-Elevated; CEA-Normal];  # OCT 13th- CARBO-TAXOL  # Bone mets- X-geva q 6 W  # MOLECULAR TESTING- ER/PR/her 2 NEG; MSI-STABLE. OCT 2017- MRI Bil Breast NEG. PDL-1- NEG [0%]- Found One  # Lung RUL nodule [benign s/p resection; 2009- Dr.Oaks]  # spring 2017- colonoscopy [Dr.Elliot; polyps]; June mammo-NEG     Primary cancer of unknown site [facility])   08/25/2016 Initial Diagnosis    Primary cancer of unknown site [facility])       HISTORY OF PRESENTING ILLNESS:  Sylvia Jones 60 y.o.  female with a history of carcinoma of unknown primary [possible breast cancer based on IHC] status post cycle #2 Approximately 3 weeks ago.  At in the interim patient was evaluated at [facility] for a second opinion.   She had a recent cough; chest x-ray negative. Treated with antibiotics. Improvement  noted.  Otherwise overall Patient feels significantly improved in the last few weeks.  Her appetite is good. Pain is improved. Notes that her neck swelling is improved. Constipation improved. No tingling and numbness. No significant nausea vomiting. No chest pain or shortness of breath. No fevers or chills.  ROS: A complete 10 point review of system is done which is negative except mentioned above in history of present illness  MEDICAL HISTORY:  Past Medical History:  Diagnosis Date  . Arthritis   . Cancer ([place])    lymph nodes, in process of diagnosing  . Cervical disc disease    upper cervical, denied numbness / tingling BUE  . Fracture 04/2016   left pubis bone-atypical   . Lymphadenopathy 09/05/2016   R clavical region  . Neuromuscular disorder ([place])    compressed disc in neck when 54 or 60 years old  . Primary cancer of unknown site [facility])   . Tubular adenoma     SURGICAL HISTORY: Past Surgical History:  Procedure Laterality Date  . APPENDECTOMY  1986  . BREAST BIOPSY Right    at age 40  . CESAREAN SECTION  1988  . CESAREAN SECTION  1988  . COLONOSCOPY WITH PROPOFOL N/A 07/12/2015   Procedure: COLONOSCOPY WITH PROPOFOL;  Surgeon: Manya Silvas, MD;  Location: [facility] ENDOSCOPY;  Service: Endoscopy;  Laterality: N/A;  . EXPLORATORY LAPAROTOMY  1986  . LYMPH GLAND EXCISION    . MASS BIOPSY N/A 09/05/2016   Procedure: NECK MASS BIOPSY;  Surgeon:  Beverly Gust, MD;  Location: [facility] ORS;  Service: ENT;  Laterality: N/A;  . MASS EXCISION  1986   to remove mass outside the uterus. procedure was combined tacked uterus, reposition of fallopian tubes   . PORTACATH PLACEMENT Left 09/20/2016   Procedure: INSERTION PORT-A-CATH;  Surgeon: Olean Ree, MD;  Location: [facility] ORS;  Service: General;  Laterality: Left;  . THORACOTOMY Right 03/2008   RLL Wedge Resection- Dr. 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 Marital status: Married    Spouse name: N/A  . Number of children: N/A  . Years of education: N/A   Occupational History  . Not on file.   Social History Main Topics  . Smoking status: Never Smoker  . Smokeless tobacco: Never Used  . Alcohol use No  . Drug use: No  . Sexual activity: Yes   Other Topics Concern  . Not on file   Social History Narrative  . No narrative on file    FAMILY HISTORY: father- ? Cancer died 1. .. Brother- kasposi sarcoma Family History  Problem Relation Age of Onset  . Cancer Father   . Heart attack Father   . Hypertension Brother   . Diabetes Maternal Grandmother   . Diabetes Paternal Grandmother   . Cancer Brother     ALLERGIES:  is allergic to sulfa antibiotics.  MEDICATIONS:  Current Outpatient Prescriptions  Medication Sig Dispense Refill  . Calcium Carb-Cholecalciferol (CALCIUM 600+D3 PO) Take by mouth.    . lactulose (CEPHULAC) 10 g packet Take 10 grams three times daily for the next 2 days. Then 10 grams daily, can increase to 10 grams three times a day for future concerns for worsening constipation 30 each 0  . polyethylene glycol (MIRALAX / GLYCOLAX) packet Take 17 g by mouth daily.    Marland Kitchen azithromycin (ZITHROMAX) 250 MG tablet Take 2 on day 1; and then 1 pill once a day. (Patient not taking: Reported on 10/23/2016) 6 each 0  . potassium chloride SA (K-DUR,KLOR-CON) 20 MEQ tablet Take 1 tablet (20 mEq total) by mouth 2 (two) times daily. X 14 days (Patient not taking: Reported on 10/23/2016) 28 tablet 0   No current facility-administered medications for this visit.    Facility-Administered Medications Ordered in Other Visits  Medication Dose Route Frequency Provider Last Rate Last Dose  . Influenza vac split quadrivalent PF (FLUARIX) injection 0.5 mL  0.5 mL Intramuscular Once Cammie Sickle, MD          .  PHYSICAL EXAMINATION: ECOG PERFORMANCE STATUS: 1 - Symptomatic but  completely ambulatory  Vitals:   10/23/16 0919  BP: 113/74  Pulse: 91  Resp: 18  Temp: 98.6 F (37 C)   Filed Weights   10/23/16 0919  Weight: 124 lb 9.6 oz (56.5 kg)    GENERAL: Well-nourished well-developed; Alert, no distress and comfortable.   With husband. Patient is wheelchair. EYES: no pallor or icterus OROPHARYNX: no thrush or ulceration; good dentition  NECK: supple, no masses felt LYMPH:  Matted lymphadenopathy noted in the left side of the neck [improved]; and also lymphadenopathy noted on the right neck posterior neck [left supra-clav- ~2-3cm] LUNGS: clear to auscultation and  No wheeze or crackles HEART/CVS: regular rate & rhythm and no murmurs; No lower extremity edema ABDOMEN: abdomen soft, non-tender and normal bowel sounds Musculoskeletal:no cyanosis of digits and no clubbing  PSYCH: alert & oriented x 3 with fluent speech NEURO: no focal motor/sensory deficits SKIN:  no rashes or significant lesions   LABORATORY DATA:  I have reviewed the data as listed Lab Results  Component Value Date   WBC 4.1 10/23/2016   HGB 11.6 (L) 10/23/2016   HCT 33.1 (L) 10/23/2016   MCV 88.3 10/23/2016   PLT 320 10/23/2016    Recent Labs  09/07/16 1512  09/29/16 0814 10/09/16 0947 10/23/16 0855  NA 137  < > 139 135 139  K 3.0*  < > 4.0 3.7 3.4*  CL 98*  < > 106 102 102  CO2 25  < > 28 25 28   GLUCOSE 109*  < > 108* 111* 105*  BUN 8  < > 9 7 7   CREATININE 0.43*  < > 0.34* 0.39* 0.46  CALCIUM 9.3  < > 9.3 8.6* 9.2  GFRNONAA >60  < > >60 >60 >60  GFRAA >60  < > >60 >60 >60  PROT 7.4  --  6.8  --  7.1  ALBUMIN 4.2  --  3.7  --  3.8  AST 89*  --  40  --  29  ALT 104*  --  29  --  19  ALKPHOS 131*  --  119  --  105  BILITOT 1.1  --  0.3  --  0.4  < > = values in this interval not displayed.  RADIOGRAPHIC STUDIES: I have personally reviewed the radiological images as listed and agreed with the findings in the report. Dg Chest 2 View  Result Date:  10/18/2016 CLINICAL DATA:  Cough, low-grade fever EXAM: CHEST  2 VIEW COMPARISON:  09/20/2016 FINDINGS: Cardiomediastinal silhouette is stable. Left subclavian Port-A-Cath is unchanged in position. No infiltrate or pleural effusion. No pulmonary edema. Mild degenerative changes thoracic spine. IMPRESSION: No active cardiopulmonary disease. Electronically Signed   By: Lahoma Crocker M.D.   On: 10/18/2016 17:24    ASSESSMENT & PLAN:   Primary cancer of unknown site [facility]) # Status post excisional lymph node biopsy- positive for carcinoma- question breast primary [based on immunohistochemistry & Cancer type ID]; status post cycle 2 of carbo-taxol-clinical response noted. Plan CT after this cycle- based on respone discussed  Changing to single agent chemotherapy.   # Proceed with cycle #3 of chemotherapy today. Labs reviewed- adequate.   # constipation- improved;  Miralax/dulcolax  # cough- improving; recommend claritin.   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 Plan again in 10 days.   # plan  Follow up in 3 weeks/ cycle # 4; CT ordered today; labs in 10 days/X-geva.     Cammie Sickle, MD 10/24/2016 8:09 AM

## 2016-10-24 LAB — CANCER ANTIGEN 19-9: CA 19-9: 124 U/mL — ABNORMAL HIGH (ref 0–35)

## 2016-10-24 LAB — CA 125: CA 125: 86.7 U/mL — ABNORMAL HIGH (ref 0.0–38.1)

## 2016-10-24 LAB — CANCER ANTIGEN 27.29: CA 27.29: 108.4 U/mL — ABNORMAL HIGH (ref 0.0–38.6)

## 2016-10-25 DIAGNOSIS — C801 Malignant (primary) neoplasm, unspecified: Secondary | ICD-10-CM | POA: Diagnosis not present

## 2016-10-25 LAB — CANCER ANTIGEN 15-3: CAN 15 3: 79.5 U/mL — AB (ref 0.0–25.0)

## 2016-10-25 NOTE — Telephone Encounter (Signed)
[facility], they are sending page to [place].

## 2016-10-25 NOTE — Telephone Encounter (Signed)
Page #1 of report received.  Given to Dr B.

## 2016-10-27 DIAGNOSIS — C50919 Malignant neoplasm of unspecified site of unspecified female breast: Secondary | ICD-10-CM | POA: Diagnosis not present

## 2016-11-01 ENCOUNTER — Other Ambulatory Visit: Payer: Self-pay | Admitting: *Deleted

## 2016-11-01 ENCOUNTER — Ambulatory Visit: Payer: 59 | Attending: Orthopedic Surgery | Admitting: Physical Therapy

## 2016-11-01 ENCOUNTER — Ambulatory Visit
Admission: RE | Admit: 2016-11-01 | Discharge: 2016-11-01 | Disposition: A | Discharge status: Home / Self Care | Payer: 59 | Source: Ambulatory Visit | Attending: Radiation Oncology | Admitting: Radiation Oncology

## 2016-11-01 ENCOUNTER — Encounter: Payer: Self-pay | Admitting: Radiation Oncology

## 2016-11-01 VITALS — BP 122/77 | HR 84 | Temp 97.6°F | Resp 18 | Wt 124.7 lb

## 2016-11-01 DIAGNOSIS — G709 Myoneural disorder, unspecified: Secondary | ICD-10-CM | POA: Diagnosis not present

## 2016-11-01 DIAGNOSIS — M6281 Muscle weakness (generalized): Secondary | ICD-10-CM | POA: Insufficient documentation

## 2016-11-01 DIAGNOSIS — C7951 Secondary malignant neoplasm of bone: Secondary | ICD-10-CM | POA: Diagnosis not present

## 2016-11-01 DIAGNOSIS — Z9049 Acquired absence of other specified parts of digestive tract: Secondary | ICD-10-CM | POA: Diagnosis not present

## 2016-11-01 DIAGNOSIS — C50919 Malignant neoplasm of unspecified site of unspecified female breast: Secondary | ICD-10-CM

## 2016-11-01 DIAGNOSIS — C779 Secondary and unspecified malignant neoplasm of lymph node, unspecified: Secondary | ICD-10-CM | POA: Insufficient documentation

## 2016-11-01 DIAGNOSIS — M5030 Other cervical disc degeneration, unspecified cervical region: Secondary | ICD-10-CM | POA: Diagnosis not present

## 2016-11-01 DIAGNOSIS — Z809 Family history of malignant neoplasm, unspecified: Secondary | ICD-10-CM | POA: Diagnosis not present

## 2016-11-01 DIAGNOSIS — Z8701 Personal history of pneumonia (recurrent): Secondary | ICD-10-CM | POA: Insufficient documentation

## 2016-11-01 DIAGNOSIS — Z79899 Other long term (current) drug therapy: Secondary | ICD-10-CM | POA: Diagnosis not present

## 2016-11-01 DIAGNOSIS — C801 Malignant (primary) neoplasm, unspecified: Secondary | ICD-10-CM | POA: Insufficient documentation

## 2016-11-01 DIAGNOSIS — R591 Generalized enlarged lymph nodes: Secondary | ICD-10-CM | POA: Diagnosis not present

## 2016-11-01 DIAGNOSIS — R2689 Other abnormalities of gait and mobility: Secondary | ICD-10-CM | POA: Insufficient documentation

## 2016-11-01 DIAGNOSIS — D369 Benign neoplasm, unspecified site: Secondary | ICD-10-CM | POA: Insufficient documentation

## 2016-11-01 DIAGNOSIS — M129 Arthropathy, unspecified: Secondary | ICD-10-CM | POA: Diagnosis not present

## 2016-11-01 NOTE — Therapy (Addendum)
[place] MAIN [facility] SERVICES [address] [place], [place], [address] Phone: [phone]   Fax:  [phone]  Physical Therapy Treatment / Progress Note  Patient Details  Name: Sylvia Jones MRN: [id number] Date of Birth: 1956/07/13 Referring Provider: Dr. Prescott Parma  Encounter Date: 11/01/2016      PT End of Session - 11/01/16 0957    Visit Number 4   Number of Visits 12   Date for PT Re-Evaluation 11/03/16   PT Start Time 0905   PT Stop Time 1000   PT Time Calculation (min) 55 min   Activity Tolerance Patient tolerated treatment well;No increased pain   Behavior During Therapy WFL for tasks assessed/performed      Past Medical History:  Diagnosis Date  . Arthritis   . Cancer ([place])    lymph nodes, in process of diagnosing  . Cervical disc disease    upper cervical, denied numbness / tingling BUE  . Fracture 04/2016   left pubis bone-atypical   . Lymphadenopathy 09/05/2016   R clavical region  . Neuromuscular disorder ([place])    compressed disc in neck when 80 or 60 years old  . Primary cancer of unknown site [facility])   . Tubular adenoma     Past Surgical History:  Procedure Laterality Date  . APPENDECTOMY  1986  . BREAST BIOPSY Right    at age 48  . CESAREAN SECTION  1988  . CESAREAN SECTION  1988  . COLONOSCOPY WITH PROPOFOL N/A 07/12/2015   Procedure: COLONOSCOPY WITH PROPOFOL;  Surgeon: Manya Silvas, MD;  Location[facility] ENDOSCOPY;  Service: Endoscopy;  Laterality: N/A;  . EXPLORATORY LAPAROTOMY  1986  . LYMPH GLAND EXCISION    . MASS BIOPSY N/A 09/05/2016   Procedure: NECK MASS BIOPSY;  Surgeon: Beverly Gust, MD;  Location: [facility] ORS;  Service: ENT;  Laterality: N/A;  . MASS EXCISION  1986   to remove mass outside the uterus. procedure was combined tacked uterus, reposition of fallopian tubes   . PORTACATH PLACEMENT Left 09/20/2016   Procedure: INSERTION PORT-A-CATH;  Surgeon: Olean Ree, MD;  Location: [facility]  ORS;  Service: General;  Laterality: Left;  . THORACOTOMY Right 03/2008   RLL Wedge Resection- Dr. Genevive Bi    There were no vitals filed for this visit.      Subjective Assessment - 11/01/16 0909    Subjective She had chemo last week and experienced L lateral hip pain. She thinks it may be related to the side effects of her chemo because it dissipated after 3 days.  Pt has not had the pubic pain for 3 weeks and has been putting weight onto her L leg include putting on her pants.  Her oncologist suggested she consult a radiation oncologist to be monitored for any bone pain.  Pt notices she is able to stretch out her leg when laying on her back and has no pain at the pubic area.    Pertinent History Recent Dx of CA (type is unknown), currently undergoing chemotherapy, endometriosis, C-section, abdominal surgeries . Pt is only able to walk very short distances for transfer ( 50 ft) and stand for 10-15 min with R LE weight bearing.              [facility] PT Assessment - 11/01/16 0954      Coordination   Gross Motor Movements are Fluid and Coordinated --  abdominal straining with diaphragmatic breathing   Fine Motor Movements are Fluid and Coordinated --  adductor overuse in RLE with pelvic flooor contraction                             PT Education - 11/01/16 0954    Education provided Yes   Education Details HEP   Person(s) Educated Patient   Methods Explanation;Demonstration;Tactile cues;Verbal cues;Handout   Comprehension Returned demonstration;Verbalized understanding             PT Long Term Goals - 10/18/16 1218      PT LONG TERM GOAL #1   Title Pt will demo proper pelvic floor coordination (no straining) with other deep core mm in order to faciliate bowel movements   Time 12   Period Weeks   Status Achieved     PT LONG TERM GOAL #2   Title Pt will demo increased hip abduction strength from 3+/5 B to > 4/5 in order to prepare for full weight bearing  and gait/stairclimbing with lowered risks for falls   Time 12   Period Weeks   Status Achieved     PT LONG TERM GOAL #3   Title Pt will be IND with HEP    Time 12   Period Weeks   Status New     PT LONG TERM GOAL #4   Title Pt will report improved stool consistency from Type 1 on Bristol Stool Scale to Type 3-4 in order to improve constipation   Time 12   Period Weeks   Status Achieved     PT LONG TERM GOAL #5   Title Pt will demo no R pubic pain with prone hip ext of L LE and be able to demo 4/5 strength in order to progress to more upright exercises   Time 12   Status achieved     Additional Long Term Goals   Additional Long Term Goals Yes     PT LONG TERM GOAL #6   Title Pt will be able to walk > 940 ft in 6 MWT without rollator in order to demo increased ability to walk and bear weight through her pelvic girdle and spine to shop and walk    Time 12   Period Weeks   Status New               Plan - 11/01/16 0955    Clinical Impression Statement Pt has achieved 4/6 goals with no pubic pain for the past 3 weeks. Pt is progressing well with her deep core level exercises with improved deep core strength after minor cuing for technique. Pt's pelvic floor strength is increasing in endurance. Pt also progressed to low resistance training in standing positions with band in addition to balance training with yoga poses.  Pt is progressing well towards her goals and anticipate pt will continue benefit from skilled PT.     Rehab Potential Fair   PT Frequency 1x / week   PT Duration 12 weeks   PT Treatment/Interventions Patient/family education;Stair training;Therapeutic activities;Neuromuscular re-education;Therapeutic exercise;Manual lymph drainage;Manual techniques;Scar mobilization;ADLs/Self Care Home Management;Aquatic Therapy;Moist Heat;Functional mobility training   Consulted and Agree with Plan of Care Patient      Patient will benefit from skilled therapeutic  intervention in order to improve the following deficits and impairments:  Decreased safety awareness, Decreased endurance, Decreased range of motion, Decreased coordination, Improper body mechanics, Postural dysfunction, Decreased strength, Decreased activity tolerance, Decreased balance  Visit Diagnosis: Muscle weakness (generalized)  Other abnormalities of gait and mobility  Problem List Patient Active Problem List   Diagnosis Date Noted  . Cancer, metastatic to bone ([place]) 09/14/2016  . Chemotherapy-induced nausea 09/07/2016  . Primary cancer of unknown site ([place]) 08/25/2016  . Lymphadenopathy 08/11/2016  . Cervical disc disease 03/08/2016  . Tubular adenoma 03/08/2016    Jerl Mina  ,PT, DPT, E-RYT  11/01/2016, 9:58 AM  [place] MAIN [facility] SERVICES [address] [place], [place], [address] Phone: [phone]   Fax:  [phone]  Name: CHRISTLYN AMMAR MRN: [id number] Date of Birth: 10/04/1956

## 2016-11-01 NOTE — Consult Note (Signed)
NEW PATIENT EVALUATION  Name: Sylvia Jones  MRN: [id number]  Date:   11/01/2016     DOB: [birth year]   This 60 y.o. female patient presents to the clinic for initial evaluation of widespread metastatic disease with spine and pelvic involvement from presumed stage IV breast cancer.  REFERRING PHYSICIAN: Rusty Aus, MD  CHIEF COMPLAINT:  Chief Complaint  Patient presents with  . Breast Cancer    Pt is here for initial consultaion of breast cancer.       DIAGNOSIS: The encounter diagnosis was Metastatic breast cancer ([place]).   PREVIOUS INVESTIGATIONS:  PET CT and CT scans reviewed Pathology report reviewed Clinical notes reviewed  HPI: Patient is a 60 year old female initially presented with left groin pain was found to have pathologic fracture of her symphysis pubis most likely secondary to metastatic disease. Workup showed extensive adenopathy as well as skeletal metastasis and biopsy in October 2017 showed adenocarcinoma of unknown primary suggestive of breast primary. MRI of her breast did not show any evidence of disease. She has been treated with [place] Taxol under medical oncology's direction with some initial good response and has had excellent resolution of her groin pain. She specifically denies any limitation of motion of her neck or lower extremities. PET CT scan does demonstrate metastasis in the upper thoracic lower cervical spine with close involvement towards the spinal cord. PET also shows hypermetabolic activity in the symphysis pubis with known osseous metastasis. She seen today for consideration of palliative treatment. She's been seen at [facility] and they have recommended observation at this point.  PLANNED TREATMENT REGIMEN: Possible palliative radiation therapy to her spine  PAST MEDICAL HISTORY:  has a past medical history of Arthritis; Cancer ([place]); Cervical disc disease; Fracture (04/2016); Lymphadenopathy (09/05/2016); Neuromuscular  disorder ([place]); Primary cancer of unknown site [facility]); and Tubular adenoma.    PAST SURGICAL HISTORY:  Past Surgical History:  Procedure Laterality Date  . APPENDECTOMY  1986  . BREAST BIOPSY Right    at age 53  . CESAREAN SECTION  1988  . CESAREAN SECTION  1988  . COLONOSCOPY WITH PROPOFOL N/A 07/12/2015   Procedure: COLONOSCOPY WITH PROPOFOL;  Surgeon: Manya Silvas, MD;  Location: [facility] ENDOSCOPY;  Service: Endoscopy;  Laterality: N/A;  . EXPLORATORY LAPAROTOMY  1986  . LYMPH GLAND EXCISION    . MASS BIOPSY N/A 09/05/2016   Procedure: NECK MASS BIOPSY;  Surgeon: Beverly Gust, MD;  Location: [facility] ORS;  Service: ENT;  Laterality: N/A;  . MASS EXCISION  1986   to remove mass outside the uterus. procedure was combined tacked uterus, reposition of fallopian tubes   . PORTACATH PLACEMENT Left 09/20/2016   Procedure: INSERTION PORT-A-CATH;  Surgeon: Olean Ree, MD;  Location: [facility] ORS;  Service: General;  Laterality: Left;  . THORACOTOMY Right 03/2008   RLL Wedge Resection- Dr. Genevive Bi    FAMILY HISTORY: family history includes Cancer in her brother and father; Diabetes in her maternal grandmother and paternal grandmother; Heart attack in her father; Hypertension in her brother.  SOCIAL HISTORY:  reports that she has never smoked. She has never used smokeless tobacco. She reports that she does not drink alcohol or use drugs.  ALLERGIES: Sulfa antibiotics  MEDICATIONS:  Current Outpatient Prescriptions  Medication Sig Dispense Refill  . Calcium Carb-Cholecalciferol (CALCIUM 600+D3 PO) Take by mouth.    . lactulose (CEPHULAC) 10 g packet Take 10 grams three times daily for the next 2 days. Then 10 grams daily, can  increase to 10 grams three times a day for future concerns for worsening constipation 30 each 0  . polyethylene glycol (MIRALAX / GLYCOLAX) packet Take 17 g by mouth daily.    Marland Kitchen azithromycin (ZITHROMAX) 250 MG tablet Take 2 on day 1; and then 1 pill once a day. (Patient not  taking: Reported on 11/01/2016) 6 each 0  . potassium chloride SA (K-DUR,KLOR-CON) 20 MEQ tablet Take 1 tablet (20 mEq total) by mouth 2 (two) times daily. X 14 days (Patient not taking: Reported on 11/01/2016) 28 tablet 0   No current facility-administered medications for this encounter.    Facility-Administered Medications Ordered in Other Encounters  Medication Dose Route Frequency Provider Last Rate Last Dose  . Influenza vac split quadrivalent PF (FLUARIX) injection 0.5 mL  0.5 mL Intramuscular Once Cammie Sickle, MD        ECOG PERFORMANCE STATUS:  1 - Symptomatic but completely ambulatory  REVIEW OF SYSTEMS:  Patient denies any weight loss, fatigue, weakness, fever, chills or night sweats. Patient denies any loss of vision, blurred vision. Patient denies any ringing  of the ears or hearing loss. No irregular heartbeat. Patient denies heart murmur or history of fainting. Patient denies any chest pain or pain radiating to her upper extremities. Patient denies any shortness of breath, difficulty breathing at night, cough or hemoptysis. Patient denies any swelling in the lower legs. Patient denies any nausea vomiting, vomiting of blood, or coffee ground material in the vomitus. Patient denies any stomach pain. Patient states has had normal bowel movements no significant constipation or diarrhea. Patient denies any dysuria, hematuria or significant nocturia. Patient denies any problems walking, swelling in the joints or loss of balance. Patient denies any skin changes, loss of hair or loss of weight. Patient denies any excessive worrying or anxiety or significant depression. Patient denies any problems with insomnia. Patient denies excessive thirst, polyuria, polydipsia. Patient denies any swollen glands, patient denies easy bruising or easy bleeding. Patient denies any recent infections, allergies or URI. Patient "s visual fields have not changed significantly in recent time.    PHYSICAL  EXAM: BP 122/77   Pulse 84   Temp 97.6 F (36.4 C)   Resp 18   Wt 124 lb 10.7 oz (56.5 kg)   LMP  (Approximate) Comment: 2012  BMI 20.75 kg/m  Well-developed female in NAD range of motion of her neck and lower extremities does not elicit pain motor sensory and DTR levels are equal and symmetric in the upper lower extremities. Proprioception is intact. Deep palpation of her spine does not elicit pain. Well-developed well-nourished patient in NAD. HEENT reveals PERLA, EOMI, discs not visualized.  Oral cavity is clear. No oral mucosal lesions are identified. Neck is clear without evidence of cervical or supraclavicular adenopathy. Lungs are clear to A&P. Cardiac examination is essentially unremarkable with regular rate and rhythm without murmur rub or thrill. Abdomen is benign with no organomegaly or masses noted. Motor sensory and DTR levels are equal and symmetric in the upper and lower extremities. Cranial nerves II through XII are grossly intact. Proprioception is intact. No peripheral adenopathy or edema is identified. No motor or sensory levels are noted. Crude visual fields are within normal range.  LABORATORY DATA: Pathology reports reviewed    RADIOLOGY RESULTS: CT scan PET CT scan reviewed   IMPRESSION: Widespread metastatic disease to both lymph nodes skeletal metastasis from adenocarcinoma of unknown primary being treated with carbotaxol by medical oncology with good tolerance and response.  PLAN: At this time I'm concerned about her lower cervical upper thoracic metastasis since some of these lesions appear to be in close proximity to her spinal cord. I would recommend palliative radiation therapy to this area 3000 cGy in 10 fractions. Risks and benefits of treatment including sore throat with possible dysphasia from radiation esophagitis fatigue skin reaction alteration of blood counts all were reviewed in detail with the patient and her husband. They would like to think about things  over Christmas and I scheduled a follow-up appointment after the holidays. I've also alerted them that if any significant pain or neurologic problems develop I need to know immediately and would request consultation at that time.  I would like to take this opportunity to thank you for allowing me to participate in the care of your patient.Armstead Peaks., MD

## 2016-11-01 NOTE — Patient Instructions (Addendum)
Laying on back   Replace deep core level 2 ( knee out)  To deep core level 3 (lift foot up as in walking)    Progress pelvic floor to 5 sec , 5 reps, 5 x  * remember less belly bulging, more ribcage expansion on the sides on inhale   _________________ Standing Video recordings:  Yoga sequence for balance and leg strength: Warrior I --> warrior 2 ----> extended side angle   (Listen to cues on the video for alignment)  Yellow band with bicep curls 10 reps     Yellow band with "pulling the lawnmover" 5 reps each side  (this also stretches the shoulders)    Hip extension/ abduction  With both hands on rollator pressing shoulders down,  10 reps each leg ---> fingers tip on wall for challenging your balance. Vision straight ahead      ____________   Aerobic exercise:  Increase 6 min walking x 2 x day to 12 min x 2 day.  Pace your activities, understand if you do more walking out in the community, take breaks

## 2016-11-02 ENCOUNTER — Other Ambulatory Visit: Payer: Self-pay

## 2016-11-02 ENCOUNTER — Inpatient Hospital Stay: Payer: 59

## 2016-11-02 ENCOUNTER — Inpatient Hospital Stay: Payer: 59 | Attending: Internal Medicine

## 2016-11-02 DIAGNOSIS — R05 Cough: Secondary | ICD-10-CM | POA: Diagnosis not present

## 2016-11-02 DIAGNOSIS — C7951 Secondary malignant neoplasm of bone: Secondary | ICD-10-CM | POA: Insufficient documentation

## 2016-11-02 DIAGNOSIS — G629 Polyneuropathy, unspecified: Secondary | ICD-10-CM | POA: Insufficient documentation

## 2016-11-02 DIAGNOSIS — K5900 Constipation, unspecified: Secondary | ICD-10-CM | POA: Insufficient documentation

## 2016-11-02 DIAGNOSIS — Z7689 Persons encountering health services in other specified circumstances: Secondary | ICD-10-CM | POA: Diagnosis not present

## 2016-11-02 DIAGNOSIS — Z5111 Encounter for antineoplastic chemotherapy: Secondary | ICD-10-CM | POA: Insufficient documentation

## 2016-11-02 DIAGNOSIS — Z79899 Other long term (current) drug therapy: Secondary | ICD-10-CM | POA: Insufficient documentation

## 2016-11-02 DIAGNOSIS — C770 Secondary and unspecified malignant neoplasm of lymph nodes of head, face and neck: Secondary | ICD-10-CM | POA: Diagnosis not present

## 2016-11-02 DIAGNOSIS — C801 Malignant (primary) neoplasm, unspecified: Secondary | ICD-10-CM

## 2016-11-02 LAB — COMPREHENSIVE METABOLIC PANEL
ALT: 39 U/L (ref 14–54)
ANION GAP: 7 (ref 5–15)
AST: 41 U/L (ref 15–41)
Albumin: 4 g/dL (ref 3.5–5.0)
Alkaline Phosphatase: 120 U/L (ref 38–126)
BILIRUBIN TOTAL: 0.4 mg/dL (ref 0.3–1.2)
BUN: 7 mg/dL (ref 6–20)
CHLORIDE: 103 mmol/L (ref 101–111)
CO2: 29 mmol/L (ref 22–32)
Calcium: 8.7 mg/dL — ABNORMAL LOW (ref 8.9–10.3)
Creatinine, Ser: 0.31 mg/dL — ABNORMAL LOW (ref 0.44–1.00)
GFR calc Af Amer: >60 mL/min (ref >60)
Glucose, Bld: 98 mg/dL (ref 65–99)
POTASSIUM: 3.5 mmol/L (ref 3.5–5.1)
Sodium: 139 mmol/L (ref 135–145)
TOTAL PROTEIN: 7.1 g/dL (ref 6.5–8.1)

## 2016-11-02 LAB — CBC
HEMATOCRIT: 33.6 % — AB (ref 35.0–47.0)
Hemoglobin: 11.6 g/dL — ABNORMAL LOW (ref 12.0–16.0)
MCH: 31.2 pg (ref 26.0–34.0)
MCHC: 34.5 g/dL (ref 32.0–36.0)
MCV: 90.4 fL (ref 80.0–100.0)
PLATELETS: 218 10*3/uL (ref 150–440)
RBC: 3.72 MIL/uL — ABNORMAL LOW (ref 3.80–5.20)
RDW: 16.4 % — AB (ref 11.5–14.5)
WBC: 9.1 10*3/uL (ref 3.6–11.0)

## 2016-11-02 MED ORDER — DENOSUMAB 120 MG/1.7ML ~~LOC~~ SOLN
120.0000 mg | Freq: Once | SUBCUTANEOUS | Status: AC
  Administered 2016-11-02: 120 mg via SUBCUTANEOUS

## 2016-11-08 ENCOUNTER — Ambulatory Visit: Payer: 59 | Admitting: Physical Therapy

## 2016-11-08 DIAGNOSIS — R2689 Other abnormalities of gait and mobility: Secondary | ICD-10-CM

## 2016-11-08 DIAGNOSIS — M6281 Muscle weakness (generalized): Secondary | ICD-10-CM

## 2016-11-08 NOTE — Patient Instructions (Addendum)
  Per your request for nutritional resource  [email or web address] [facility] Now by De Blanch, PT, CNS    Vernell Morgans, MD  ___________  Mini-squat and side stepping 10 ft each direction x 2   __________ Modify warrior with hands on waist instead of lifting arms too high,  Twisting at midback 20-30 deg rather than turning the neck   [email or web address] Relax into Yoga with Anna Genre   Consider attendnig Tai Chi classes at the [facility] ___________ Hold off on band exercises    ____________ Post-walk stretches:  Hip flexor, --> over head reaching, hand on opposite thigh of front leg for a lumbar twist  Calf, Gluts (piriformis) Quads   3 breaths each side

## 2016-11-08 NOTE — Therapy (Signed)
[place] MAIN [facility] [address] [place], [place], [address] Phone: [phone]   Fax:  [phone]  Physical Therapy Treatment  Patient Details  Name: Sylvia Jones MRN: [id number] Date of Birth: [birth year] Referring Provider: Dr. Prescott Parma  Encounter Date: 11/08/2016      PT End of Session - 11/08/16 1512    Visit Number 5   Number of Visits 12   Date for PT Re-Evaluation 11/03/16   PT Start Time 1402   PT Stop Time 1504   PT Time Calculation (min) 62 min   Activity Tolerance Patient tolerated treatment well;No increased pain   Behavior During Therapy WFL for tasks assessed/performed      Past Medical History:  Diagnosis Date  . Arthritis   . Cancer ([place])    lymph nodes, in process of diagnosing  . Cervical disc disease    upper cervical, denied numbness / tingling BUE  . Fracture 04/2016   left pubis bone-atypical   . Lymphadenopathy 09/05/2016   R clavical region  . Neuromuscular disorder ([place])    compressed disc in neck when 23 or 60 years old  . Primary cancer of unknown site [facility])   . Tubular adenoma     Past Surgical History:  Procedure Laterality Date  . APPENDECTOMY  1986  . BREAST BIOPSY Right    at age 79  . CESAREAN SECTION  1988  . CESAREAN SECTION  1988  . COLONOSCOPY WITH PROPOFOL N/A 07/12/2015   Procedure: COLONOSCOPY WITH PROPOFOL;  Surgeon: Manya Silvas, MD;  Location[facility] END[facility]COPY;  Service: Endoscopy;  Laterality: N/A;  . EXPLORATORY LAPAROTOMY  1986  . LYMPH GLAND EXCISION    . MASS BIOPSY N/A 09/05/2016   Procedure: NECK MASS BIOPSY;  Surgeon: Beverly Gust, MD;  Location: [facility] ORS;  Service: ENT;  Laterality: N/A;  . MASS EXCISION  1986   to remove mass outside the uterus. procedure was combined tacked uterus, reposition of fallopian tubes   . PORTACATH PLACEMENT Left 09/20/2016   Procedure: INSERTION PORT-A-CATH;  Surgeon: Olean Ree, MD;  Location: [facility] ORS;  Service:  General;  Laterality: Left;  . THORACOTOMY Right 03/2008   RLL Wedge Resection- Dr. Genevive Bi    There were no vitals filed for this visit.      Subjective Assessment - 11/08/16 1410    Subjective Pt reported she has been using her rollator less. Pt felt some deep ache in her L hip socket with increased walking on her L leg and then she started to the favoring the RLE.  Pt is concerned about her previous exercises affecting the Fx in her cervical spine. Pt consulted her radioation-oncologist and learned that radiation therapy would be recommended for that area.    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 Pt is only able to walk very short distances for transfer ( 50 ft) and stand for 10-15 min with R LE weight bearing.              [facility] PT Assessment - 11/08/16 1507      Squat   Comments poor stability of metatarsals when rising up to standing , required cuing for slight hip ER and toe abduction     6 minute walk test results    Aerobic Endurance Distance Walked 1050  without rollator  [facility] PT Treatment/Exercise - 11/08/16 1510      Therapeutic Activites    Therapeutic Activities --  see pt instructions. Educated pt to continue pelvic floor exercises and that PT following radiation therapy can be beneficial with clearance by MD                PT Education - 11/08/16 1437    Education provided Yes   Education Details HEP, d/c    Person(s) Educated Patient   Methods Explanation;Demonstration;Tactile cues;Verbal cues;Handout   Comprehension Verbalized understanding;Returned demonstration             PT Long Term Goals - 11/08/16 1422      PT LONG TERM GOAL #1   Title Pt will demo proper pelvic floor coordination (no straining) with other deep core mm in order to faciliate bowel movements   Time 12   Period Weeks    Status Achieved     PT LONG TERM GOAL #2   Title Pt will demo increased hip abduction strength from 3+/5 B to > 4/5 in order to prepare for full weight bearing and gait/stairclimbing with lowered risks for falls   Time 12   Period Weeks   Status Achieved     PT LONG TERM GOAL #3   Title Pt will be IND with HEP    Time 12   Period Weeks   Status Achieved     PT LONG TERM GOAL #4   Title Pt will report improved stool consistency from Type 1 on Bristol Stool Scale to Type 3-4 in order to improve constipation   Time 12   Period Weeks   Status Achieved     PT LONG TERM GOAL #5   Title Pt will demo no R pubic pain with prone hip ext of L LE and be able to demo 4/5 strength in order to progress to more upright exercises   Time 12   Status Achieved     Additional Long Term Goals   Additional Long Term Goals Yes     PT LONG TERM GOAL #6   Title Pt will be able to walk > 940 ft in 6 MWT without rollator in order to demo increased ability to walk and bear weight through her pelvic girdle and spine to shop and walk  (12/13: 1050)    Time 12   Period Weeks   Status Achieved     PT LONG TERM GOAL #7   Title Pt will demo IND with self-management across 1 month    Time 12   Period Weeks   Status New               Plan - 11/08/16 1513    Clinical Impression Statement Pt is close to achieving her remaining goal. Pt has b een able to decrease her use of the rollator with increased ability to bear weight onto her LLE.  Pt has demo'd today increased endurance with 6 minute walk test without use of rollator.  Due to cervical Fx and pt's concern,  pt's resistance band HEP has been withheld and her HEP has been modified to decrease cervical rotation. Pt was educated on nutritional resources (book) per her request and was also recommended a yoga DVD and tai chi class at the cancer center as low grade forms of exercises for health and maintenance.  Plan to d/c at next visit.    Rehab  Potential Fair   PT Frequency 1x / week  PT Duration 12 weeks   PT Treatment/Interventions Patient/family education;Stair training;Therapeutic activities;Neuromuscular re-education;Therapeutic exercise;Manual lymph drainage;Manual techniques;Scar mobilization;ADLs/Self Care Home Management;Aquatic Therapy;Moist Heat;Functional mobility training   Consulted and Agree with Plan of Care Patient      Patient will benefit from skilled therapeutic intervention in order to improve the following deficits and impairments:  Decreased safety awareness, Decreased endurance, Decreased range of motion, Decreased coordination, Improper body mechanics, Postural dysfunction, Decreased strength, Decreased activity tolerance, Decreased balance  Visit Diagnosis: Muscle weakness (generalized)  Other abnormalities of gait and mobility     Problem List Patient Active Problem List   Diagnosis Date Noted  . Cancer, metastatic to bone ([place]) 09/14/2016  . Chemotherapy-induced nausea 09/07/2016  . Primary cancer of unknown site ([place]) 08/25/2016  . Lymphadenopathy 08/11/2016  . Cervical disc disease 03/08/2016  . Tubular adenoma 03/08/2016    Jerl Mina ,PT, DPT, E-RYT  11/08/2016, 3:18 PM  [place] [facility] [address] [place], [place], [address] Phone: [phone]   Fax:  [phone]  Name: Sylvia Jones MRN: [id number] Date of Birth: 09/26/1956

## 2016-11-10 ENCOUNTER — Ambulatory Visit
Admission: RE | Admit: 2016-11-10 | Discharge: 2016-11-10 | Disposition: A | Discharge status: Home / Self Care | Payer: 59 | Source: Ambulatory Visit | Attending: Internal Medicine | Admitting: Internal Medicine

## 2016-11-10 DIAGNOSIS — C801 Malignant (primary) neoplasm, unspecified: Secondary | ICD-10-CM | POA: Diagnosis not present

## 2016-11-10 DIAGNOSIS — M8448XA Pathological fracture, other site, initial encounter for fracture: Secondary | ICD-10-CM | POA: Diagnosis not present

## 2016-11-10 DIAGNOSIS — C779 Secondary and unspecified malignant neoplasm of lymph node, unspecified: Secondary | ICD-10-CM | POA: Diagnosis not present

## 2016-11-10 DIAGNOSIS — C7951 Secondary malignant neoplasm of bone: Secondary | ICD-10-CM | POA: Diagnosis not present

## 2016-11-10 MED ORDER — IOPAMIDOL (ISOVUE-300) INJECTION 61%
100.0000 mL | Freq: Once | INTRAVENOUS | Status: AC | PRN
  Administered 2016-11-10: 100 mL via INTRAVENOUS

## 2016-11-13 ENCOUNTER — Inpatient Hospital Stay (HOSPITAL_BASED_OUTPATIENT_CLINIC_OR_DEPARTMENT_OTHER): Payer: 59 | Admitting: Internal Medicine

## 2016-11-13 ENCOUNTER — Inpatient Hospital Stay: Payer: 59

## 2016-11-13 VITALS — BP 128/71 | HR 82 | Temp 97.5°F | Wt 127.1 lb

## 2016-11-13 DIAGNOSIS — G629 Polyneuropathy, unspecified: Secondary | ICD-10-CM

## 2016-11-13 DIAGNOSIS — K5900 Constipation, unspecified: Secondary | ICD-10-CM | POA: Diagnosis not present

## 2016-11-13 DIAGNOSIS — Z5111 Encounter for antineoplastic chemotherapy: Secondary | ICD-10-CM | POA: Diagnosis not present

## 2016-11-13 DIAGNOSIS — R110 Nausea: Secondary | ICD-10-CM

## 2016-11-13 DIAGNOSIS — C801 Malignant (primary) neoplasm, unspecified: Secondary | ICD-10-CM | POA: Diagnosis not present

## 2016-11-13 DIAGNOSIS — Z79899 Other long term (current) drug therapy: Secondary | ICD-10-CM | POA: Diagnosis not present

## 2016-11-13 DIAGNOSIS — R05 Cough: Secondary | ICD-10-CM | POA: Diagnosis not present

## 2016-11-13 DIAGNOSIS — C770 Secondary and unspecified malignant neoplasm of lymph nodes of head, face and neck: Secondary | ICD-10-CM | POA: Diagnosis not present

## 2016-11-13 DIAGNOSIS — C7951 Secondary malignant neoplasm of bone: Secondary | ICD-10-CM

## 2016-11-13 DIAGNOSIS — Z7689 Persons encountering health services in other specified circumstances: Secondary | ICD-10-CM | POA: Diagnosis not present

## 2016-11-13 DIAGNOSIS — T451X5A Adverse effect of antineoplastic and immunosuppressive drugs, initial encounter: Secondary | ICD-10-CM

## 2016-11-13 LAB — CBC WITH DIFFERENTIAL/PLATELET
BASOS PCT: 1 %
Basophils Absolute: 0.1 10*3/uL (ref 0–0.1)
EOS ABS: 0.1 10*3/uL (ref 0–0.7)
EOS PCT: 2 %
HCT: 33.3 % — ABNORMAL LOW (ref 35.0–47.0)
HEMOGLOBIN: 11.5 g/dL — AB (ref 12.0–16.0)
LYMPHS ABS: 0.7 10*3/uL — AB (ref 1.0–3.6)
Lymphocytes Relative: 15 %
MCH: 31.4 pg (ref 26.0–34.0)
MCHC: 34.6 g/dL (ref 32.0–36.0)
MCV: 90.9 fL (ref 80.0–100.0)
MONOS PCT: 12 %
Monocytes Absolute: 0.6 10*3/uL (ref 0.2–0.9)
NEUTROS PCT: 70 %
Neutro Abs: 3.6 10*3/uL (ref 1.4–6.5)
PLATELETS: 241 10*3/uL (ref 150–440)
RBC: 3.66 MIL/uL — AB (ref 3.80–5.20)
RDW: 17.1 % — ABNORMAL HIGH (ref 11.5–14.5)
WBC: 5.1 10*3/uL (ref 3.6–11.0)

## 2016-11-13 LAB — COMPREHENSIVE METABOLIC PANEL
ALBUMIN: 3.9 g/dL (ref 3.5–5.0)
ALT: 31 U/L (ref 14–54)
ANION GAP: 6 (ref 5–15)
AST: 40 U/L (ref 15–41)
Alkaline Phosphatase: 110 U/L (ref 38–126)
BUN: 8 mg/dL (ref 6–20)
CHLORIDE: 105 mmol/L (ref 101–111)
CO2: 26 mmol/L (ref 22–32)
Calcium: 9.1 mg/dL (ref 8.9–10.3)
Creatinine, Ser: 0.39 mg/dL — ABNORMAL LOW (ref 0.44–1.00)
GFR calc non Af Amer: >60 mL/min (ref >60)
GLUCOSE: 91 mg/dL (ref 65–99)
POTASSIUM: 3.8 mmol/L (ref 3.5–5.1)
SODIUM: 137 mmol/L (ref 135–145)
Total Bilirubin: 0.6 mg/dL (ref 0.3–1.2)
Total Protein: 7.3 g/dL (ref 6.5–8.1)

## 2016-11-13 MED ORDER — SODIUM CHLORIDE 0.9 % IV SOLN
20.0000 mg | Freq: Once | INTRAVENOUS | Status: AC
  Administered 2016-11-13: 20 mg via INTRAVENOUS
  Filled 2016-11-13: qty 2

## 2016-11-13 MED ORDER — SODIUM CHLORIDE 0.9 % IJ SOLN
10.0000 mL | Freq: Once | INTRAMUSCULAR | Status: AC
  Administered 2016-11-13: 10 mL via INTRAVENOUS
  Filled 2016-11-13: qty 10

## 2016-11-13 MED ORDER — HEPARIN SOD (PORK) LOCK FLUSH 100 UNIT/ML IV SOLN
500.0000 [IU] | Freq: Once | INTRAVENOUS | Status: DC | PRN
  Filled 2016-11-13 (×2): qty 5

## 2016-11-13 MED ORDER — FAMOTIDINE IN NACL 20-0.9 MG/50ML-% IV SOLN
20.0000 mg | Freq: Once | INTRAVENOUS | Status: AC
  Administered 2016-11-13: 20 mg via INTRAVENOUS
  Filled 2016-11-13: qty 50

## 2016-11-13 MED ORDER — DIPHENHYDRAMINE HCL 50 MG/ML IJ SOLN
50.0000 mg | Freq: Once | INTRAMUSCULAR | Status: AC
  Administered 2016-11-13: 50 mg via INTRAVENOUS
  Filled 2016-11-13: qty 1

## 2016-11-13 MED ORDER — PALONOSETRON HCL INJECTION 0.25 MG/5ML
0.2500 mg | Freq: Once | INTRAVENOUS | Status: AC
  Administered 2016-11-13: 0.25 mg via INTRAVENOUS
  Filled 2016-11-13: qty 5

## 2016-11-13 MED ORDER — SODIUM CHLORIDE 0.9 % IV SOLN
Freq: Once | INTRAVENOUS | Status: AC
  Administered 2016-11-13: 11:00:00 via INTRAVENOUS
  Filled 2016-11-13: qty 1000

## 2016-11-13 MED ORDER — SODIUM CHLORIDE 0.9 % IV SOLN
200.0000 mg/m2 | Freq: Once | INTRAVENOUS | Status: AC
  Administered 2016-11-13: 324 mg via INTRAVENOUS
  Filled 2016-11-13: qty 54

## 2016-11-13 MED ORDER — SODIUM CHLORIDE 0.9 % IV SOLN
556.8000 mg | Freq: Once | INTRAVENOUS | Status: AC
  Administered 2016-11-13: 560 mg via INTRAVENOUS
  Filled 2016-11-13: qty 56

## 2016-11-13 MED ORDER — PEGFILGRASTIM 6 MG/0.6ML ~~LOC~~ PSKT
6.0000 mg | PREFILLED_SYRINGE | Freq: Once | SUBCUTANEOUS | Status: AC
  Administered 2016-11-13: 6 mg via SUBCUTANEOUS
  Filled 2016-11-13: qty 0.6

## 2016-11-13 MED ORDER — HEPARIN SOD (PORK) LOCK FLUSH 100 UNIT/ML IV SOLN
500.0000 [IU] | Freq: Once | INTRAVENOUS | Status: AC
  Administered 2016-11-13: 500 [IU] via INTRAVENOUS
  Filled 2016-11-13: qty 5

## 2016-11-13 NOTE — Assessment & Plan Note (Addendum)
#   Status post excisional lymph node biopsy- positive for carcinoma- question breast primary [based on immunohistochemistry & Cancer type ID]; status post cycle 3 of carbo-taxol-clinical response noted. CT scan shows partial response; except for ? New mediastinal adenopathy/right hilar lymph node.  # Proceed with cycle #4 of chemotherapy today. Labs reviewed- adequate. After this cycle we will change chemotherapy to single agent Taxol weekly.  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  # Cough- ? Reflux vs others; prilosec BID. If not improved try albuterol  # constipation- improved;  Miralax continue for now.   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 Evaluated by Dr.Crystal; await for Rt for now. Not taking any pain medications.  # follow up in 3 weeks/labs/chemo; Taxol only; labs. X-geva.

## 2016-11-13 NOTE — Progress Notes (Signed)
Patient here today for follow up.  Patient would like to discuss Dr Donette Larry recommendations about radiation, concerns about being around school age children over the holidays & CT results .

## 2016-11-13 NOTE — Progress Notes (Signed)
[facility] NOTE  Patient Care Team: Rusty Aus, MD as PCP - General (Internal Medicine)  CHIEF COMPLAINTS/PURPOSE OF CONSULTATION:   Oncology History   # SEP 2017- LYMPHADENOPATHY- extensive- abdominal/RP/ bil neck LN R>L. 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   # Bone mets- X-geva q 6 W  # MOLECULAR TESTING- ER/PR/her 2 NEG; MSI-STABLE. OCT 2017- MRI Bil Breast NEG. PDL-1- NEG [0%]- Found One [NOV 2017]- PTEN **[others]  # Lung RUL nodule [benign s/p resection; 2009- Dr.Oaks]  # spring 2017- colonoscopy [Dr.Elliot; polyps]; June mammo-NEG     Primary cancer of unknown site [facility])   08/25/2016 Initial Diagnosis    Primary cancer of unknown site [facility])       Oncology History   # SEP 2017- LYMPHADENOPATHY- extensive- abdominal/RP/ bil neck LN R>L. 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   # Bone mets- X-geva q 6 W  # MOLECULAR TESTING- ER/PR/her 2 NEG; MSI-STABLE. OCT 2017- MRI Bil Breast NEG. PDL-1- NEG [0%]- Found One [NOV 2017]- PTEN **[others]  # Lung RUL nodule [benign s/p resection; 2009- Dr.Oaks]  # spring 2017- colonoscopy [Dr.Elliot; polyps]; June mammo-NEG     Primary cancer of unknown site [facility])   08/25/2016 Initial Diagnosis    Primary cancer of unknown site [facility])       HISTORY OF PRESENTING ILLNESS:  Sylvia Jones 59 y.o.  female with a history of carcinoma of unknown primary [possible breast cancer based on IHC] status post cycle #3 Approximately 3 weeks ago; To review the results of the restaging CAT  scan.  The interim patient was also better by radiation oncology for possible radiation to her pubic bone; T2 thoracic bone. At this time patient denies any significant pain.  Patient continues to have intermittent cough. Complains of reflux. Mild tingling and numbness in hands and feet. Complains of fatigue after chemotherapy; for the first 2-3 days which improves thereafter.  Her appetite is good. Pain is improved. Notes that her neck swelling is improved. Constipation improved. No tingling and numbness. No significant nausea vomiting. No chest pain or shortness of breath. No fevers or chills.  ROS: A complete 10 point review of system is done which is negative except mentioned above in history of present illness  MEDICAL HISTORY:  Past Medical History:  Diagnosis Date  . Arthritis   . Cancer ([place])    lymph nodes, in process of diagnosing  . Cervical disc disease    upper cervical, denied numbness / tingling BUE  . Fracture 04/2016   left pubis bone-atypical   . Lymphadenopathy 09/05/2016   R clavical region  . Neuromuscular disorder ([place])    compressed disc in neck when 43 or 60 years old  . Primary cancer of unknown site [facility])   . Tubular adenoma     SURGICAL HISTORY: Past Surgical History:  Procedure Laterality Date  . APPENDECTOMY  1986  . BREAST BIOPSY Right    at age 81  . CESAREAN SECTION  1988  . CESAREAN SECTION  1988  . COLONOSCOPY WITH PROPOFOL N/A 07/12/2015  Procedure: COLONOSCOPY WITH PROPOFOL;  Surgeon: Manya Silvas, MD;  Location: [facility] ENDOSCOPY;  Service: Endoscopy;  Laterality: N/A;  . EXPLORATORY LAPAROTOMY  1986  . LYMPH GLAND EXCISION    . MASS BIOPSY N/A 09/05/2016   Procedure: NECK MASS BIOPSY;  Surgeon: Beverly Gust, MD;  Location: [facility] ORS;  Service: ENT;  Laterality: N/A;  . MASS EXCISION  1986   to remove mass outside the uterus. procedure was combined tacked uterus, reposition of fallopian tubes   . PORTACATH PLACEMENT Left 09/20/2016    Procedure: INSERTION PORT-A-CATH;  Surgeon: Olean Ree, MD;  Location: [facility] ORS;  Service: General;  Laterality: Left;  . THORACOTOMY Right 03/2008   RLL Wedge Resection- Dr. 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 Marital status: Married    Spouse name: N/A  . Number of children: N/A  . Years of education: N/A   Occupational History  . Not on file.   Social History Main Topics  . Smoking status: Never Smoker  . Smokeless tobacco: Never Used  . Alcohol use No  . Drug use: No  . Sexual activity: Yes   Other Topics Concern  . Not on file   Social History Narrative  . No narrative on file    FAMILY HISTORY: father- ? Cancer died 33. .. Brother- kasposi sarcoma Family History  Problem Relation Age of Onset  . Cancer Father   . Heart attack Father   . Hypertension Brother   . Diabetes Maternal Grandmother   . Diabetes Paternal Grandmother   . Cancer Brother     ALLERGIES:  is allergic to sulfa antibiotics.  MEDICATIONS:  Current Outpatient Prescriptions  Medication Sig Dispense Refill  . Calcium Carb-Cholecalciferol (CALCIUM 600+D3 PO) Take by mouth.    . lactulose (CEPHULAC) 10 g packet Take 10 grams three times daily for the next 2 days. Then 10 grams daily, can increase to 10 grams three times a day for future concerns for worsening constipation 30 each 0  . polyethylene glycol (MIRALAX / GLYCOLAX) packet Take 17 g by mouth daily.    . Sennosides (SENNA LAX PO) Take by mouth.     No current facility-administered medications for this visit.    Facility-Administered Medications Ordered in Other Visits  Medication Dose Route Frequency Provider Last Rate Last Dose  . Influenza vac split quadrivalent PF (FLUARIX) injection 0.5 mL  0.5 mL Intramuscular Once Cammie Sickle, MD          .  PHYSICAL EXAMINATION: ECOG PERFORMANCE STATUS: 1 -  Symptomatic but completely ambulatory  Vitals:   11/13/16 0934  BP: 128/71  Pulse: 82  Temp: 97.5 F (36.4 C)   Filed Weights   11/13/16 0934  Weight: 127 lb 2 oz (57.7 kg)    GENERAL: Well-nourished well-developed; Alert, no distress and comfortable.   With husband. Patient is wheelchair. EYES: no pallor or icterus OROPHARYNX: no thrush or ulceration; good dentition  NECK: supple, no masses felt LYMPH:  Matted lymphadenopathy noted in the left side of the neck [improved]; and also lymphadenopathy noted on the right neck posterior neck [left supra-clav- ~2-3cm] LUNGS: clear to auscultation and  No wheeze or crackles HEART/CVS: regular rate & rhythm and no murmurs; No lower extremity edema ABDOMEN: abdomen soft, non-tender and normal bowel sounds Musculoskeletal:no cyanosis of digits and no clubbing  PSYCH: alert & oriented x 3  with fluent speech NEURO: no focal motor/sensory deficits SKIN:  no rashes or significant lesions   LABORATORY DATA:  I have reviewed the data as listed Lab Results  Component Value Date   WBC 5.1 11/13/2016   HGB 11.5 (L) 11/13/2016   HCT 33.3 (L) 11/13/2016   MCV 90.9 11/13/2016   PLT 241 11/13/2016    Recent Labs  10/23/16 0855 11/02/16 1007 11/13/16 0904  NA 139 139 137  K 3.4* 3.5 3.8  CL 102 103 105  CO2 28 29 26   GLUCOSE 105* 98 91  BUN 7 7 8   CREATININE 0.46 0.31* 0.39*  CALCIUM 9.2 8.7* 9.1  GFRNONAA >60 >60 >60  GFRAA >60 >60 >60  PROT 7.1 7.1 7.3  ALBUMIN 3.8 4.0 3.9  AST 29 41 40  ALT 19 39 31  ALKPHOS 105 120 110  BILITOT 0.4 0.4 0.6    RADIOGRAPHIC STUDIES: I have personally reviewed the radiological images as listed and agreed with the findings in the report. Dg Chest 2 View  Result Date: 10/18/2016 CLINICAL DATA:  Cough, low-grade fever EXAM: CHEST  2 VIEW COMPARISON:  09/20/2016 FINDINGS: Cardiomediastinal silhouette is stable. Left subclavian Port-A-Cath is unchanged in position. No infiltrate or pleural  effusion. No pulmonary edema. Mild degenerative changes thoracic spine. IMPRESSION: No active cardiopulmonary disease. Electronically Signed   By: Lahoma Crocker M.D.   On: 10/18/2016 17:24   Ct Soft Tissue Neck W Contrast  Result Date: 11/10/2016 CLINICAL DATA:  60 year old female with metastatic lymphadenopathy in the neck abdomen and pelvis, as well as lytic bone metastases demonstrated on October PET-CT. Adenocarcinoma of unknown primary. Subsequent encounter. EXAM: CT NECK WITH CONTRAST TECHNIQUE: Multidetector CT imaging of the neck was performed using the standard protocol following the bolus administration of intravenous contrast. CONTRAST:  144m ISOVUE-300 IOPAMIDOL (ISOVUE-300) INJECTION 61% in conjunction with contrast enhanced imaging of the chest, abdomen, and pelvis reported separately. COMPARISON:  Brain MRI 09/12/2016. PET-CT 08/29/2016. Neck CT 08/11/2016. FINDINGS: Pharynx and larynx: Negative larynx. Pharynx soft tissue contours remain normal. Negative parapharyngeal and retropharyngeal spaces. Salivary glands: Negative sublingual space, submandibular glands and parotid glands. Thyroid: Negative. Lymph nodes: Regression since 08/11/2016 of most of the previously demonstrated rounded, heterogeneous, and malignant appearing bilateral cervical lymph nodes. Previously identified abnormal nodes up to 9 mm short axis have decreased. Most are now 5 mm short axis or smaller. There is a residual 6 mm right level IIa node on series 8, image 34 which previously was 8 mm. A cluster of left level 2 B lymph nodes measuring up to 5-6 mm on series 8, image 28 appears slightly larger than in September (series 2 image 32 at that time). A nearby left level IIa lymph node measures 7 mm short axis on series 8, image 36 (Previously 6-7 mm). Other lymph node stations remain normal. Vascular: Major vascular structures in the neck and at the skullbase remain patent. Limited intracranial: Negative. Visualized orbits:  Negative. Mastoids and visualized paranasal sinuses: Clear. Skeleton: No destructive osseous lesion at the skullbase. Stable cervical vertebral body bone mineralization, subtle lucency in the central C5 body appears stable and may be a benign hemangioma. There is a pathologic appearing fracture of the C7 spinous process which is new since September (series 5, image 65). The T2 vertebral body lesion which was lytic and September now has mixed density but appears mildly increased in size from 9-10 now to 12-13 mm (same image). Upper chest: Reported separately today. IMPRESSION: 1. Cervical lymph  nodes demonstrate a positive response to treatment since September. The previously most heterogeneous and malignant appearing nodes have all regressed. There is a cluster of mildly increased but still subcentimeter left level II nodes to which attention is directed on followup. 2. Pathologic fracture of the C7 spinous process is new since September. The T2 vertebral body metastasis appears mildly larger, but less lytic overall. 3. CT Chest Abdomen And Pelvis today reported separately. Electronically Signed   By: Genevie Ann M.D.   On: 11/10/2016 09:52   Ct Chest W Contrast  Result Date: 11/10/2016 CLINICAL DATA:  Metastatic adenocarcinoma of unknown primary. Undergoing chemotherapy. EXAM: CT CHEST, ABDOMEN, AND PELVIS WITH CONTRAST TECHNIQUE: Multidetector CT imaging of the chest, abdomen and pelvis was performed following the standard protocol during bolus administration of intravenous contrast. CONTRAST:  12m ISOVUE-300 IOPAMIDOL (ISOVUE-300) INJECTION 61% COMPARISON:  AP CT on 09/12/2016 and PET-CT on 08/29/2016 FINDINGS: CT CHEST FINDINGS Cardiovascular: No acute findings. Left Port-A-Cath in appropriate position. Aortic atherosclerosis. Mediastinum/Lymph Nodes: Subcarinal mediastinal lymphadenopathy is seen measuring 1.9 cm short axis. Mild right hilar lymphadenopathy is also seen which is new measuring 1.5 cm in short  axis. Comparison limited by lack of intravenous contrast on previous PET-CT, however no hypermetabolic was seen in these regions. This lymphadenopathy appears new. No left hilar or axillary lymphadenopathy identified. Lungs/Pleura: New mild airspace opacity is seen within right perihilar region, likely due to postobstructive atelectasis or pneumonitis due to adjacent right hilar lymphadenopathy. No other suspicious pulmonary nodules or masses are identified. Right lower lobe scarring remains stable. No evidence of pleural effusion . Musculoskeletal:  No suspicious bone lesions identified. CT ABDOMEN AND PELVIS FINDINGS Hepatobiliary: No masses identified. Pancreas:  No mass or inflammatory changes. Spleen:  Within normal limits in size and appearance. Adrenals/Urinary tract:  No masses or hydronephrosis. Stomach/Bowel: No evidence of obstruction, inflammatory process, or abnormal fluid collections. Previously seen colonic dilatation is resolved since previous study. Vascular/Lymphatic: Upper abdominal and retroperitoneal lymphadenopathy has nearly completely resolved since previous study. Index lymph node in left paraaortic region measures 5 mm on image 65/2 hit 10 mm on previous study. Previously seen mild bilateral iliac lymphadenopathy in the pelvis is also resolved. Previously seen mesenteric lymphadenopathy is also nearly completely resolved, with no persistent pathologically enlarged mesenteric or nodes identified. No abdominal aortic aneurysm. Reproductive:  No mass or other significant abnormality identified. Other:  None. Musculoskeletal: Bone metastases involving the T2 vertebral body and left posterior first rib show increased sclerosis, consistent with healing treated bone metastases. Sclerotic metastasis again seen involving the left pubis. No new bone lesions identified. IMPRESSION: Mild subcarinal mediastinal and right hilar lymphadenopathy, which appears new compared to previous PET-CT. Near complete  resolution of abdominal and pelvic lymphadenopathy since prior exam. Increased sclerosis involving bone metastases in the upper thoracic spine, left posterior first rib, and left pubis, consistent with interval healing/response to therapy. Electronically Signed   By: JEarle GellM.D.   On: 11/10/2016 10:57   Ct Abdomen Pelvis W Contrast  Result Date: 11/10/2016 CLINICAL DATA:  Metastatic adenocarcinoma of unknown primary. Undergoing chemotherapy. EXAM: CT CHEST, ABDOMEN, AND PELVIS WITH CONTRAST TECHNIQUE: Multidetector CT imaging of the chest, abdomen and pelvis was performed following the standard protocol during bolus administration of intravenous contrast. CONTRAST:  1029mISOVUE-300 IOPAMIDOL (ISOVUE-300) INJECTION 61% COMPARISON:  AP CT on 09/12/2016 and PET-CT on 08/29/2016 FINDINGS: CT CHEST FINDINGS Cardiovascular: No acute findings. Left Port-A-Cath in appropriate position. Aortic atherosclerosis. Mediastinum/Lymph Nodes: Subcarinal mediastinal lymphadenopathy is  seen measuring 1.9 cm short axis. Mild right hilar lymphadenopathy is also seen which is new measuring 1.5 cm in short axis. Comparison limited by lack of intravenous contrast on previous PET-CT, however no hypermetabolic was seen in these regions. This lymphadenopathy appears new. No left hilar or axillary lymphadenopathy identified. Lungs/Pleura: New mild airspace opacity is seen within right perihilar region, likely due to postobstructive atelectasis or pneumonitis due to adjacent right hilar lymphadenopathy. No other suspicious pulmonary nodules or masses are identified. Right lower lobe scarring remains stable. No evidence of pleural effusion . Musculoskeletal:  No suspicious bone lesions identified. CT ABDOMEN AND PELVIS FINDINGS Hepatobiliary: No masses identified. Pancreas:  No mass or inflammatory changes. Spleen:  Within normal limits in size and appearance. Adrenals/Urinary tract:  No masses or hydronephrosis. Stomach/Bowel: No  evidence of obstruction, inflammatory process, or abnormal fluid collections. Previously seen colonic dilatation is resolved since previous study. Vascular/Lymphatic: Upper abdominal and retroperitoneal lymphadenopathy has nearly completely resolved since previous study. Index lymph node in left paraaortic region measures 5 mm on image 65/2 hit 10 mm on previous study. Previously seen mild bilateral iliac lymphadenopathy in the pelvis is also resolved. Previously seen mesenteric lymphadenopathy is also nearly completely resolved, with no persistent pathologically enlarged mesenteric or nodes identified. No abdominal aortic aneurysm. Reproductive:  No mass or other significant abnormality identified. Other:  None. Musculoskeletal: Bone metastases involving the T2 vertebral body and left posterior first rib show increased sclerosis, consistent with healing treated bone metastases. Sclerotic metastasis again seen involving the left pubis. No new bone lesions identified. IMPRESSION: Mild subcarinal mediastinal and right hilar lymphadenopathy, which appears new compared to previous PET-CT. Near complete resolution of abdominal and pelvic lymphadenopathy since prior exam. Increased sclerosis involving bone metastases in the upper thoracic spine, left posterior first rib, and left pubis, consistent with interval healing/response to therapy. Electronically Signed   By: Earle Gell M.D.   On: 11/10/2016 10:57    ASSESSMENT & PLAN:   Primary cancer of unknown site [facility]) # Status post excisional lymph node biopsy- positive for carcinoma- question breast primary [based on immunohistochemistry & Cancer type ID]; status post cycle 3 of carbo-taxol-clinical response noted. CT scan shows partial response; except for ? New mediastinal adenopathy/right hilar lymph node.  # Proceed with cycle #4 of chemotherapy today. Labs reviewed- adequate. After this cycle we will change chemotherapy to single agent Taxol weekly.  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  # Cough- ? Reflux vs others; prilosec BID. If not improved try albuterol  # constipation- improved;  Miralax continue for now.   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 Evaluated by Dr.Crystal; await for Rt for now. Not taking any pain medications.  # follow up in 3 weeks/labs/chemo; Taxol only; labs. X-geva.     Cammie Sickle, MD 11/14/2016 8:00 AM

## 2016-11-14 ENCOUNTER — Encounter: Payer: 59 | Admitting: Physical Therapy

## 2016-11-14 NOTE — Progress Notes (Signed)
DISCONTINUE OFF PATHWAY REGIMEN - [Other Dx]  Carboplatin AUC=6 + Paclitaxel 200 mg/m2 q21 days  [id number]:Carboplatin AUC=6 + Paclitaxel 200 mg/m2 q21 days:   A cycle is every 21 days:     Paclitaxel (Taxol(R)) 200 mg/m2 in 500 mL NS IV over 3 hours followed by Dose Mod: None     Carboplatin (Paraplatin(R)) AUC=6 in 250 mL NS IV over 1 hour Dose Mod: None Additional Orders: * All AUC calculations intended to be used in [employer] formula  **Always confirm dose/schedule in your pharmacy ordering system**    REASON: Toxicities / Adverse Event PRIOR TREATMENT: Carboplatin AUC=6 + Paclitaxel 200 mg/m2 q21 days TREATMENT RESPONSE: Partial Response (PR)  START OFF PATHWAY REGIMEN - [Other Dx]  Paclitaxel 70 mg/m2 3 weeks on, 1 week off  [id number]:Paclitaxel 70 mg/m2 3 weeks on, 1 week off:   A cycle is q28days (3 weeks on and 1 week off):     Paclitaxel (Taxol(R)) 70 mg/m2 in 250 mL NS IV over 1 hour days 1,8,15 Dose Mod: None  **Always confirm dose/schedule in your pharmacy ordering system**    Intent of Therapy: Non-Curative / Palliative Intent, Discussed with Patient

## 2016-11-23 ENCOUNTER — Ambulatory Visit: Payer: 59 | Admitting: Radiation Oncology

## 2016-11-30 ENCOUNTER — Ambulatory Visit: Payer: 59 | Attending: Orthopedic Surgery | Admitting: Physical Therapy

## 2016-11-30 DIAGNOSIS — R2689 Other abnormalities of gait and mobility: Secondary | ICD-10-CM | POA: Insufficient documentation

## 2016-11-30 DIAGNOSIS — M6281 Muscle weakness (generalized): Secondary | ICD-10-CM | POA: Insufficient documentation

## 2016-11-30 NOTE — Patient Instructions (Addendum)
·   _5_  sec  hold x _7_ reps x 5 times a day across 2-3 weeks   On 4-6 weeks, _5_ sec holds, x_9_reps x 5 times a day and eventually reach 10 reps at 5 sec for 2-3 weeks   Vaginal tissue support:  Replens Long-Lasting Vaginal Moisturizer  http://www.replens.com/FAB-Blog/Fighting-Vaginal-Dryness/48/What-is-the-Difference-Between-Lubricants-and-Vaginal-Moisturizers.aspx  And consult with your gynecologist and oncologist about using this moisturizer and the questions you had   Maintain regular walking routine 10 mins as a solo activity for aerobic health in addition to the yoga practice and strengthening exercises you learned  Continue to have rest breaks , chunk out activities

## 2016-12-01 NOTE — Therapy (Signed)
[place] [facility] SERVICES [address] [place], [place], [address] Phone: [phone]   Fax:  [phone]  Physical Therapy Treatment/ Discharge Summary   Patient Details  Name: Sylvia Jones MRN: [id number] Date of Birth: May 30, 1956 Referring Provider: Dr. Prescott Parma  Encounter Date: 11/30/2016      PT End of Session - 12/01/16 1711                PT Start Time 1516   PT Stop Time 1540   PT Time Calculation (min) 24 min   Activity Tolerance Patient tolerated treatment well;No increased pain   Behavior During Therapy WFL for tasks assessed/performed      Past Medical History:  Diagnosis Date  . Arthritis   . Cancer ([place])    lymph nodes, in process of diagnosing  . Cervical disc disease    upper cervical, denied numbness / tingling BUE  . Fracture 04/2016   left pubis bone-atypical   . Lymphadenopathy 09/05/2016   R clavical region  . Neuromuscular disorder ([place])    compressed disc in neck when 58 or 61 years old  . Primary cancer of unknown site [facility])   . Tubular adenoma     Past Surgical History:  Procedure Laterality Date  . APPENDECTOMY  1986  . BREAST BIOPSY Right    at age 36  . CESAREAN SECTION  1988  . CESAREAN SECTION  1988  . COLONOSCOPY WITH PROPOFOL N/A 07/12/2015   Procedure: COLONOSCOPY WITH PROPOFOL;  Surgeon: Manya Silvas, MD;  Location: [facility] ENDOSCOPY;  Service: Endoscopy;  Laterality: N/A;  . EXPLORATORY LAPAROTOMY  1986  . LYMPH GLAND EXCISION    . MASS BIOPSY N/A 09/05/2016   Procedure: NECK MASS BIOPSY;  Surgeon: Beverly Gust, MD;  Location: [facility] ORS;  Service: ENT;  Laterality: N/A;  . MASS EXCISION  1986   to remove mass outside the uterus. procedure was combined tacked uterus, reposition of fallopian tubes   . PORTACATH PLACEMENT Left 09/20/2016   Procedure: INSERTION PORT-A-CATH;  Surgeon: Olean Ree, MD;  Location: [facility] ORS;  Service: General;  Laterality: Left;  . THORACOTOMY Right  03/2008   RLL Wedge Resection- Dr. Genevive Bi    There were no vitals filed for this visit.      Subjective Assessment - 11/30/16 1523    Subjective Pt is not using her rollator since her last visit. Pt was able to make a trip out of town without pain nor fatigue.    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 Pt is only able to walk very short distances for transfer ( 50 ft) and stand for 10-15 min with R LE weight bearing.                                   PT Education - 11/30/16 1548    Education provided Yes   Education Details d/c   Person(s) Educated Patient   Methods Explanation;Demonstration;Tactile cues;Verbal cues;Handout   Comprehension Returned demonstration;Verbalized understanding             PT Long Term Goals - 11/30/16 1525      PT LONG TERM GOAL #1   Title Pt will demo proper pelvic floor coordination (no straining) with other deep core mm in order to faciliate bowel movements   Time 12   Period  Weeks   Status Achieved     PT LONG TERM GOAL #2   Title Pt will demo increased hip abduction strength from 3+/5 B to > 4/5 in order to prepare for full weight bearing and gait/stairclimbing with lowered risks for falls   Time 12   Period Weeks   Status Achieved     PT LONG TERM GOAL #3   Title Pt will be IND with HEP    Time 12   Period Weeks   Status Achieved     PT LONG TERM GOAL #4   Title Pt will report improved stool consistency from Type 1 on Bristol Stool Scale to Type 3-4 in order to improve constipation   Time 12   Period Weeks   Status Achieved     PT LONG TERM GOAL #5   Title Pt will demo no R pubic pain with prone hip ext of L LE and be able to demo 4/5 strength in order to progress to more upright exercises   Time 12   Status Achieved     PT LONG TERM GOAL #6   Title Pt will be able to walk > 940 ft in 6 MWT without  rollator in order to demo increased ability to walk and bear weight through her pelvic girdle and spine to shop and walk  (12/13: 1050)    Time 12   Period Weeks   Status Achieved     PT LONG TERM GOAL #7   Title Pt will demo IND with self-management across 1 month    Time 12   Period Weeks   Status Achieved               Plan - 11/30/16 1529    Clinical Impression Statement Pt has progressed well with Pelvic Health PT across the past 5 visits. Pt has achieved 100% of her goals and she states her pubic pain has improved "Quite a Bit Better" since she started her POC. Pt has been able to walk without her rollator at home and in the community without pain nor fatigue for over the past month.  Pt will continue to undergo chemotherapy Tx in the upcoming weeks to treat her CA which was associated with her pubic Fx. Her HEP has been modified with caution related to her Fx located in her T3/1st rib.   Pt has also reported improved bowel movements.  Pt has been educated on how to progress with her pelvic floor mm strengthening program to maintain strength in her pelvic girdle and to maintain overall pelvic health. Pt was educated to pelvic floor changes post-CA Tx and was advised to also follow up with her oncology nurses.  Pt was also provided therapeutic yoga DVD as a form of gentle exercise as a weight bearing activity to manage her osteoporosis, balance, and overall energy level . Pt was recommended to maintain an aerobic routine to maintain circulatory health. Pt voiced understanding.  Pt is ready for d/c at this time.     Rehab Potential Fair   PT Frequency 1x / week   PT Duration 12 weeks   PT Treatment/Interventions Patient/family education;Stair training;Therapeutic activities;Neuromuscular re-education;Therapeutic exercise;Manual lymph drainage;Manual techniques;Scar mobilization;ADLs/Self Care Home Management;Aquatic Therapy;Moist Heat;Functional mobility training   Consulted and Agree  with Plan of Care Patient      Patient will benefit from skilled therapeutic intervention in order to improve the following deficits and impairments:  Decreased safety awareness, Decreased endurance, Decreased range of motion, Decreased coordination,  Improper body mechanics, Postural dysfunction, Decreased strength, Decreased activity tolerance, Decreased balance  Visit Diagnosis: Muscle weakness (generalized)  Other abnormalities of gait and mobility     Problem List Patient Active Problem List   Diagnosis Date Noted  . Cancer, metastatic to bone ([place]) 09/14/2016  . Chemotherapy-induced nausea 09/07/2016  . Primary cancer of unknown site ([place]) 08/25/2016  . Lymphadenopathy 08/11/2016  . Cervical disc disease 03/08/2016  . Tubular adenoma 03/08/2016    Jerl Mina ,PT, DPT, E-RYT  12/01/2016, 5:27 PM  [place] [facility]IN [facility] SERVICES [address] [place], [place], [address] Phone: [phone]   Fax:  [phone]  Name: Sylvia Jones MRN: [id number] Date of Birth: [birth year]

## 2016-12-04 ENCOUNTER — Encounter: Payer: Self-pay | Admitting: Internal Medicine

## 2016-12-06 ENCOUNTER — Inpatient Hospital Stay: Payer: 59 | Attending: Internal Medicine | Admitting: Internal Medicine

## 2016-12-06 ENCOUNTER — Ambulatory Visit
Admission: RE | Admit: 2016-12-06 | Discharge: 2016-12-06 | Disposition: A | Discharge status: Home / Self Care | Payer: 59 | Source: Ambulatory Visit | Attending: Internal Medicine | Admitting: Internal Medicine

## 2016-12-06 ENCOUNTER — Inpatient Hospital Stay (HOSPITAL_BASED_OUTPATIENT_CLINIC_OR_DEPARTMENT_OTHER): Payer: 59

## 2016-12-06 ENCOUNTER — Inpatient Hospital Stay: Payer: 59

## 2016-12-06 VITALS — BP 114/72 | HR 80 | Temp 97.5°F | Wt 125.2 lb

## 2016-12-06 DIAGNOSIS — R590 Localized enlarged lymph nodes: Secondary | ICD-10-CM | POA: Insufficient documentation

## 2016-12-06 DIAGNOSIS — M79605 Pain in left leg: Secondary | ICD-10-CM | POA: Diagnosis not present

## 2016-12-06 DIAGNOSIS — Z79899 Other long term (current) drug therapy: Secondary | ICD-10-CM | POA: Diagnosis not present

## 2016-12-06 DIAGNOSIS — M7989 Other specified soft tissue disorders: Secondary | ICD-10-CM | POA: Insufficient documentation

## 2016-12-06 DIAGNOSIS — Z5111 Encounter for antineoplastic chemotherapy: Secondary | ICD-10-CM | POA: Diagnosis not present

## 2016-12-06 DIAGNOSIS — C7951 Secondary malignant neoplasm of bone: Secondary | ICD-10-CM | POA: Diagnosis not present

## 2016-12-06 DIAGNOSIS — M79652 Pain in left thigh: Secondary | ICD-10-CM | POA: Insufficient documentation

## 2016-12-06 DIAGNOSIS — C801 Malignant (primary) neoplasm, unspecified: Secondary | ICD-10-CM | POA: Insufficient documentation

## 2016-12-06 DIAGNOSIS — Z5181 Encounter for therapeutic drug level monitoring: Secondary | ICD-10-CM

## 2016-12-06 DIAGNOSIS — R110 Nausea: Secondary | ICD-10-CM

## 2016-12-06 DIAGNOSIS — C778 Secondary and unspecified malignant neoplasm of lymph nodes of multiple regions: Secondary | ICD-10-CM | POA: Insufficient documentation

## 2016-12-06 DIAGNOSIS — T451X5A Adverse effect of antineoplastic and immunosuppressive drugs, initial encounter: Principal | ICD-10-CM

## 2016-12-06 DIAGNOSIS — R1909 Other intra-abdominal and pelvic swelling, mass and lump: Secondary | ICD-10-CM | POA: Insufficient documentation

## 2016-12-06 DIAGNOSIS — G629 Polyneuropathy, unspecified: Secondary | ICD-10-CM | POA: Insufficient documentation

## 2016-12-06 LAB — COMPREHENSIVE METABOLIC PANEL
ALBUMIN: 3.9 g/dL (ref 3.5–5.0)
ALT: 25 U/L (ref 14–54)
ANION GAP: 6 (ref 5–15)
AST: 33 U/L (ref 15–41)
Alkaline Phosphatase: 112 U/L (ref 38–126)
BILIRUBIN TOTAL: 0.7 mg/dL (ref 0.3–1.2)
BUN: 10 mg/dL (ref 6–20)
CO2: 26 mmol/L (ref 22–32)
Calcium: 8.9 mg/dL (ref 8.9–10.3)
Chloride: 106 mmol/L (ref 101–111)
Creatinine, Ser: 0.31 mg/dL — ABNORMAL LOW (ref 0.44–1.00)
GFR calc non Af Amer: >60 mL/min (ref >60)
GLUCOSE: 115 mg/dL — AB (ref 65–99)
POTASSIUM: 3.6 mmol/L (ref 3.5–5.1)
SODIUM: 138 mmol/L (ref 135–145)
TOTAL PROTEIN: 7.2 g/dL (ref 6.5–8.1)

## 2016-12-06 LAB — CBC WITH DIFFERENTIAL/PLATELET
BASOS PCT: 1 %
Basophils Absolute: 0 10*3/uL (ref 0–0.1)
EOS ABS: 0.2 10*3/uL (ref 0–0.7)
Eosinophils Relative: 4 %
HCT: 33.6 % — ABNORMAL LOW (ref 35.0–47.0)
Hemoglobin: 11.6 g/dL — ABNORMAL LOW (ref 12.0–16.0)
Lymphocytes Relative: 17 %
Lymphs Abs: 0.7 10*3/uL — ABNORMAL LOW (ref 1.0–3.6)
MCH: 32.3 pg (ref 26.0–34.0)
MCHC: 34.7 g/dL (ref 32.0–36.0)
MCV: 93 fL (ref 80.0–100.0)
MONO ABS: 0.5 10*3/uL (ref 0.2–0.9)
MONOS PCT: 12 %
Neutro Abs: 2.7 10*3/uL (ref 1.4–6.5)
Neutrophils Relative %: 66 %
Platelets: 194 10*3/uL (ref 150–440)
RBC: 3.61 MIL/uL — ABNORMAL LOW (ref 3.80–5.20)
RDW: 16.5 % — ABNORMAL HIGH (ref 11.5–14.5)
WBC: 4.2 10*3/uL (ref 3.6–11.0)

## 2016-12-06 MED ORDER — PACLITAXEL CHEMO INJECTION 300 MG/50ML
132.0000 mg | Freq: Once | INTRAVENOUS | Status: AC
  Administered 2016-12-06: 132 mg via INTRAVENOUS
  Filled 2016-12-06: qty 22

## 2016-12-06 MED ORDER — HEPARIN SOD (PORK) LOCK FLUSH 100 UNIT/ML IV SOLN
500.0000 [IU] | Freq: Once | INTRAVENOUS | Status: AC | PRN
  Administered 2016-12-06: 500 [IU]
  Filled 2016-12-06: qty 5

## 2016-12-06 MED ORDER — DENOSUMAB 120 MG/1.7ML ~~LOC~~ SOLN
120.0000 mg | Freq: Once | SUBCUTANEOUS | Status: AC
  Administered 2016-12-06: 120 mg via SUBCUTANEOUS
  Filled 2016-12-06: qty 1.7

## 2016-12-06 MED ORDER — FAMOTIDINE IN NACL 20-0.9 MG/50ML-% IV SOLN
20.0000 mg | Freq: Once | INTRAVENOUS | Status: AC
  Administered 2016-12-06: 20 mg via INTRAVENOUS
  Filled 2016-12-06: qty 50

## 2016-12-06 MED ORDER — DEXAMETHASONE SODIUM PHOSPHATE 10 MG/ML IJ SOLN
10.0000 mg | Freq: Once | INTRAMUSCULAR | Status: AC
  Administered 2016-12-06: 10 mg via INTRAVENOUS
  Filled 2016-12-06: qty 1
  Filled 2016-12-06: qty 3

## 2016-12-06 MED ORDER — DIPHENHYDRAMINE HCL 50 MG/ML IJ SOLN
50.0000 mg | Freq: Once | INTRAMUSCULAR | Status: AC
  Administered 2016-12-06: 50 mg via INTRAVENOUS
  Filled 2016-12-06: qty 1

## 2016-12-06 MED ORDER — SODIUM CHLORIDE 0.9 % IV SOLN
Freq: Once | INTRAVENOUS | Status: AC
  Administered 2016-12-06: 10:00:00 via INTRAVENOUS
  Filled 2016-12-06: qty 1000

## 2016-12-06 MED ORDER — PACLITAXEL CHEMO INJECTION 300 MG/50ML: 80.0000 mg/m2 | Freq: Once | INTRAVENOUS | Status: DC

## 2016-12-06 MED ORDER — SODIUM CHLORIDE 0.9 % IV SOLN: 10.0000 mg | Freq: Once | INTRAVENOUS | Status: DC

## 2016-12-06 NOTE — Progress Notes (Signed)
[place] NOTE  Patient Care Team: Rusty Aus, MD as PCP - General (Internal Medicine)  CHIEF COMPLAINTS/PURPOSE OF CONSULTATION:   Oncology History   # SEP 2017- LYMPHADENOPATHY- extensive- abdominal/RP/ bil neck LN R>L. 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   # Bone mets- X-geva q 6 W  # MOLECULAR TESTING- ER/PR/her 2 NEG; MSI-STABLE. OCT 2017- MRI Bil Breast NEG. PDL-1- NEG [0%]- Found One [NOV 2017]- PTEN **[others]  # Lung RUL nodule [benign s/p resection; 2009- Dr.Oaks]  # spring 2017- colonoscopy [Dr.Elliot; polyps]; June mammo-NEG     Primary cancer of unknown site [facility])   08/25/2016 Initial Diagnosis    Primary cancer of unknown site [facility])       Oncology History   # SEP 2017- LYMPHADENOPATHY- extensive- abdominal/RP/ bil neck LN R>L. 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   # Bone mets- X-geva q 6 W  # MOLECULAR TESTING- ER/PR/her 2 NEG; MSI-STABLE. OCT 2017- MRI Bil Breast NEG. PDL-1- NEG [0%]- Found One [NOV 2017]- PTEN **[others]  # Lung RUL nodule [benign s/p resection; 2009- Dr.Oaks]  # spring 2017- colonoscopy [Dr.Elliot; polyps]; June mammo-NEG     Primary cancer of unknown site [facility])   08/25/2016 Initial Diagnosis    Primary cancer of unknown site [facility])       HISTORY OF PRESENTING ILLNESS:  Sylvia Jones 61 y.o.  female with a history of carcinoma of unknown primary [possible breast cancer based on IHC] status post cycle #4 Approximately 3 weeks ago.   Patient noted to have slight increase in  the swelling of the left neck. No significant pain or tenderness. She notes to have mild pain in the left medial thigh.   Otherwise no cough. Mild fatigue after chemotherapy. Currently improved. Mild tingling and numbness of the extremities.  She is walking herself.  No significant nausea vomiting. No chest pain or shortness of breath. No fevers or chills.  ROS: A complete 10 point review of system is done which is negative except mentioned above in history of present illness  MEDICAL HISTORY:  Past Medical History:  Diagnosis Date  . Arthritis   . Cancer ([place])    lymph nodes, in process of diagnosing  . Cervical disc disease    upper cervical, denied numbness / tingling BUE  . Fracture 04/2016   left pubis bone-atypical   . Lymphadenopathy 09/05/2016   R clavical region  . Neuromuscular disorder ([place])    compressed disc in neck when 16 or 61 years old  . Primary cancer of unknown site [facility])   . Tubular adenoma     SURGICAL HISTORY: Past Surgical History:  Procedure Laterality Date  . APPENDECTOMY  1986  . BREAST BIOPSY Right    at age 82  . CESAREAN SECTION  1988  . CESAREAN SECTION  1988  . COLONOSCOPY WITH PROPOFOL N/A 07/12/2015   Procedure: COLONOSCOPY WITH PROPOFOL;  Surgeon: Manya Silvas, MD;  Location: [facility] ENDOSCOPY;  Service: Endoscopy;  Laterality: N/A;  . EXPLORATORY LAPAROTOMY  1986  . LYMPH GLAND EXCISION    .  MASS BIOPSY N/A 09/05/2016   Procedure: NECK MASS BIOPSY;  Surgeon: Beverly Gust, MD;  Location: [facility] ORS;  Service: ENT;  Laterality: N/A;  . MASS EXCISION  1986   to remove mass outside the uterus. procedure was combined tacked uterus, reposition of fallopian tubes   . PORTACATH PLACEMENT Left 09/20/2016   Procedure: INSERTION PORT-A-CATH;  Surgeon: Olean Ree, MD;  Location: [facility] ORS;  Service: General;  Laterality: Left;  . THORACOTOMY Right 03/2008   RLL Wedge Resection- Dr. 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 Marital status: Married    Spouse name: N/A  . Number of children: N/A  . Years of education: N/A   Occupational History  . Not on file.   Social History Main Topics  . Smoking status: Never Smoker  . Smokeless tobacco: Never Used  . Alcohol use No  . Drug use: No  . Sexual activity: Yes   Other Topics Concern  . Not on file   Social History Narrative  . No narrative on file    FAMILY HISTORY: father- ? Cancer died 64. .. Brother- kasposi sarcoma Family History  Problem Relation Age of Onset  . Cancer Father   . Heart attack Father   . Hypertension Brother   . Diabetes Maternal Grandmother   . Diabetes Paternal Grandmother   . Cancer Brother     ALLERGIES:  is allergic to sulfa antibiotics.  MEDICATIONS:  Current Outpatient Prescriptions  Medication Sig Dispense Refill  . Calcium Carb-Cholecalciferol (CALCIUM 600+D3 PO) Take by mouth.    . lactulose (CEPHULAC) 10 g packet Take 10 grams three times daily for the next 2 days. Then 10 grams daily, can increase to 10 grams three times a day for future concerns for worsening constipation 30 each 0  . polyethylene glycol (MIRALAX / GLYCOLAX) packet Take 17 g by mouth daily.    . Sennosides (SENNA LAX PO) Take by mouth.     No current facility-administered medications for this visit.    Facility-Administered Medications Ordered in Other Visits  Medication Dose Route Frequency Provider Last Rate Last Dose  . Influenza vac split quadrivalent PF (FLUARIX) injection 0.5 mL  0.5 mL Intramuscular Once Cammie Sickle, MD          .  PHYSICAL EXAMINATION: ECOG PERFORMANCE STATUS: 1 - Symptomatic but completely ambulatory  Vitals:   12/06/16 0851  BP: 114/72  Pulse: 80  Temp: 97.5 F (36.4 C)   Filed Weights   12/06/16 0851  Weight: 125 lb 4 oz (56.8 kg)    GENERAL: Well-nourished well-developed; Alert, no distress  and comfortable.   With husband/Son. Walking herself. EYES: no pallor or icterus OROPHARYNX: no thrush or ulceration; good dentition  NECK: supple, no masses felt LYMPH:  Matted lymphadenopathy noted in the left side of the neck [slightly worsened]   LUNGS: clear to auscultation and  No wheeze or crackles HEART/CVS: regular rate & rhythm and no murmurs; No lower extremity edema. Mild tenderness noted in the medial aspect of the left thigh. ABDOMEN: abdomen soft, non-tender and normal bowel sounds Musculoskeletal:no cyanosis of digits and no clubbing  PSYCH: alert & oriented x 3 with fluent speech NEURO: no focal motor/sensory deficits SKIN:  no rashes or significant lesions   LABORATORY DATA:  I have reviewed the data as listed Lab Results  Component Value Date  WBC 4.2 12/06/2016   HGB 11.6 (L) 12/06/2016   HCT 33.6 (L) 12/06/2016   MCV 93.0 12/06/2016   PLT 194 12/06/2016    Recent Labs  11/02/16 1007 11/13/16 0904 12/06/16 0821  NA 139 137 138  K 3.5 3.8 3.6  CL 103 105 106  CO2 _0 GLUCOSE 98 91 115*  BUN _1 CREATININE 0.31* 0.39* 0.31*  CALCIUM 8.7* 9.1 8.9  GFRNONAA >60 >60 >60  GFRAA >60 >60 >60  PROT 7.1 7.3 7.2  ALBUMIN 4.0 3.9 3.9  AST 41 40 33  ALT 39 31 25  ALKPHOS 120 110 112  BILITOT 0.4 0.6 0.7    RADIOGRAPHIC STUDIES: I have personally reviewed the radiological images as listed and agreed with the findings in the report. Ct Soft Tissue Neck W Contrast  Result Date: 11/10/2016 CLINICAL DATA:  61 year old female with metastatic lymphadenopathy in the neck abdomen and pelvis, as well as lytic bone metastases demonstrated on October PET-CT. Adenocarcinoma of unknown primary. Subsequent encounter. EXAM: CT NECK WITH CONTRAST TECHNIQUE: Multidetector CT imaging of the neck was performed using the standard protocol following the bolus administration of intravenous contrast. CONTRAST:  13m ISOVUE-300 IOPAMIDOL (ISOVUE-300) INJECTION 61% in  conjunction with contrast enhanced imaging of the chest, abdomen, and pelvis reported separately. COMPARISON:  Brain MRI 09/12/2016. PET-CT 08/29/2016. Neck CT 08/11/2016. FINDINGS: Pharynx and larynx: Negative larynx. Pharynx soft tissue contours remain normal. Negative parapharyngeal and retropharyngeal spaces. Salivary glands: Negative sublingual space, submandibular glands and parotid glands. Thyroid: Negative. Lymph nodes: Regression since 08/11/2016 of most of the previously demonstrated rounded, heterogeneous, and malignant appearing bilateral cervical lymph nodes. Previously identified abnormal nodes up to 9 mm short axis have decreased. Most are now 5 mm short axis or smaller. There is a residual 6 mm right level IIa node on series 8, image 34 which previously was 8 mm. A cluster of left level 2 B lymph nodes measuring up to 5-6 mm on series 8, image 28 appears slightly larger than in September (series 2 image 32 at that time). A nearby left level IIa lymph node measures 7 mm short axis on series 8, image 36 (Previously 6-7 mm). Other lymph node stations remain normal. Vascular: Major vascular structures in the neck and at the skullbase remain patent. Limited intracranial: Negative. Visualized orbits: Negative. Mastoids and visualized paranasal sinuses: Clear. Skeleton: No destructive osseous lesion at the skullbase. Stable cervical vertebral body bone mineralization, subtle lucency in the central C5 body appears stable and may be a benign hemangioma. There is a pathologic appearing fracture of the C7 spinous process which is new since September (series 5, image 65). The T2 vertebral body lesion which was lytic and September now has mixed density but appears mildly increased in size from 9-10 now to 12-13 mm (same image). Upper chest: Reported separately today. IMPRESSION: 1. Cervical lymph nodes demonstrate a positive response to treatment since September. The previously most heterogeneous and malignant  appearing nodes have all regressed. There is a cluster of mildly increased but still subcentimeter left level II nodes to which attention is directed on followup. 2. Pathologic fracture of the C7 spinous process is new since September. The T2 vertebral body metastasis appears mildly larger, but less lytic overall. 3. CT Chest Abdomen And Pelvis today reported separately. Electronically Signed   By: HGenevie AnnM.D.   On: 11/10/2016 09:52   Ct Chest W Contrast  Result Date: 11/10/2016 CLINICAL DATA:  Metastatic adenocarcinoma  of unknown primary. Undergoing chemotherapy. EXAM: CT CHEST, ABDOMEN, AND PELVIS WITH CONTRAST TECHNIQUE: Multidetector CT imaging of the chest, abdomen and pelvis was performed following the standard protocol during bolus administration of intravenous contrast. CONTRAST:  159m ISOVUE-300 IOPAMIDOL (ISOVUE-300) INJECTION 61% COMPARISON:  AP CT on 09/12/2016 and PET-CT on 08/29/2016 FINDINGS: CT CHEST FINDINGS Cardiovascular: No acute findings. Left Port-A-Cath in appropriate position. Aortic atherosclerosis. Mediastinum/Lymph Nodes: Subcarinal mediastinal lymphadenopathy is seen measuring 1.9 cm short axis. Mild right hilar lymphadenopathy is also seen which is new measuring 1.5 cm in short axis. Comparison limited by lack of intravenous contrast on previous PET-CT, however no hypermetabolic was seen in these regions. This lymphadenopathy appears new. No left hilar or axillary lymphadenopathy identified. Lungs/Pleura: New mild airspace opacity is seen within right perihilar region, likely due to postobstructive atelectasis or pneumonitis due to adjacent right hilar lymphadenopathy. No other suspicious pulmonary nodules or masses are identified. Right lower lobe scarring remains stable. No evidence of pleural effusion . Musculoskeletal:  No suspicious bone lesions identified. CT ABDOMEN AND PELVIS FINDINGS Hepatobiliary: No masses identified. Pancreas:  No mass or inflammatory changes. Spleen:   Within normal limits in size and appearance. Adrenals/Urinary tract:  No masses or hydronephrosis. Stomach/Bowel: No evidence of obstruction, inflammatory process, or abnormal fluid collections. Previously seen colonic dilatation is resolved since previous study. Vascular/Lymphatic: Upper abdominal and retroperitoneal lymphadenopathy has nearly completely resolved since previous study. Index lymph node in left paraaortic region measures 5 mm on image 65/2 hit 10 mm on previous study. Previously seen mild bilateral iliac lymphadenopathy in the pelvis is also resolved. Previously seen mesenteric lymphadenopathy is also nearly completely resolved, with no persistent pathologically enlarged mesenteric or nodes identified. No abdominal aortic aneurysm. Reproductive:  No mass or other significant abnormality identified. Other:  None. Musculoskeletal: Bone metastases involving the T2 vertebral body and left posterior first rib show increased sclerosis, consistent with healing treated bone metastases. Sclerotic metastasis again seen involving the left pubis. No new bone lesions identified. IMPRESSION: Mild subcarinal mediastinal and right hilar lymphadenopathy, which appears new compared to previous PET-CT. Near complete resolution of abdominal and pelvic lymphadenopathy since prior exam. Increased sclerosis involving bone metastases in the upper thoracic spine, left posterior first rib, and left pubis, consistent with interval healing/response to therapy. Electronically Signed   By: JEarle GellM.D.   On: 11/10/2016 10:57   Ct Abdomen Pelvis W Contrast  Result Date: 11/10/2016 CLINICAL DATA:  Metastatic adenocarcinoma of unknown primary. Undergoing chemotherapy. EXAM: CT CHEST, ABDOMEN, AND PELVIS WITH CONTRAST TECHNIQUE: Multidetector CT imaging of the chest, abdomen and pelvis was performed following the standard protocol during bolus administration of intravenous contrast. CONTRAST:  1047mISOVUE-300 IOPAMIDOL  (ISOVUE-300) INJECTION 61% COMPARISON:  AP CT on 09/12/2016 and PET-CT on 08/29/2016 FINDINGS: CT CHEST FINDINGS Cardiovascular: No acute findings. Left Port-A-Cath in appropriate position. Aortic atherosclerosis. Mediastinum/Lymph Nodes: Subcarinal mediastinal lymphadenopathy is seen measuring 1.9 cm short axis. Mild right hilar lymphadenopathy is also seen which is new measuring 1.5 cm in short axis. Comparison limited by lack of intravenous contrast on previous PET-CT, however no hypermetabolic was seen in these regions. This lymphadenopathy appears new. No left hilar or axillary lymphadenopathy identified. Lungs/Pleura: New mild airspace opacity is seen within right perihilar region, likely due to postobstructive atelectasis or pneumonitis due to adjacent right hilar lymphadenopathy. No other suspicious pulmonary nodules or masses are identified. Right lower lobe scarring remains stable. No evidence of pleural effusion . Musculoskeletal:  No suspicious bone lesions identified.  CT ABDOMEN AND PELVIS FINDINGS Hepatobiliary: No masses identified. Pancreas:  No mass or inflammatory changes. Spleen:  Within normal limits in size and appearance. Adrenals/Urinary tract:  No masses or hydronephrosis. Stomach/Bowel: No evidence of obstruction, inflammatory process, or abnormal fluid collections. Previously seen colonic dilatation is resolved since previous study. Vascular/Lymphatic: Upper abdominal and retroperitoneal lymphadenopathy has nearly completely resolved since previous study. Index lymph node in left paraaortic region measures 5 mm on image 65/2 hit 10 mm on previous study. Previously seen mild bilateral iliac lymphadenopathy in the pelvis is also resolved. Previously seen mesenteric lymphadenopathy is also nearly completely resolved, with no persistent pathologically enlarged mesenteric or nodes identified. No abdominal aortic aneurysm. Reproductive:  No mass or other significant abnormality identified. Other:   None. Musculoskeletal: Bone metastases involving the T2 vertebral body and left posterior first rib show increased sclerosis, consistent with healing treated bone metastases. Sclerotic metastasis again seen involving the left pubis. No new bone lesions identified. IMPRESSION: Mild subcarinal mediastinal and right hilar lymphadenopathy, which appears new compared to previous PET-CT. Near complete resolution of abdominal and pelvic lymphadenopathy since prior exam. Increased sclerosis involving bone metastases in the upper thoracic spine, left posterior first rib, and left pubis, consistent with interval healing/response to therapy. Electronically Signed   By: Earle Gell M.D.   On: 11/10/2016 10:57   US Venous Img Lower Unilateral Left  Result Date: 12/06/2016 CLINICAL DATA:  Left leg swelling for 2 days EXAM: LEFT LOWER EXTREMITY VENOUS DUPLEX ULTRASOUND TECHNIQUE: Doppler venous assessment of the left lower extremity deep venous system was performed, including characterization of spectral flow, compressibility, and phasicity. COMPARISON:  None. FINDINGS: There is complete compressibility of the left common femoral, femoral, and popliteal veins. Doppler analysis demonstrates respiratory phasicity and augmentation of flow with calf compression. No obvious superficial vein or calf vein thrombosis. There is an abnormally enlarged mass in the left inguinal region measuring 1.4 x 1.2 x 1.1 cm. It is hypoechoic and bulb which Edmonia Lynch is without a fatty hilum. IMPRESSION: No evidence of left lower extremity DVT. Abnormal hypoechoic mass in the left inguinal region is nonspecific and may represent an pathologic lymph node. Malignancy or inflammatory process are not excluded. Electronically Signed   By: Marybelle Killings M.D.   On: 12/06/2016 16:36    ASSESSMENT & PLAN:   Primary cancer of unknown site [facility]) # Status post excisional lymph node biopsy- positive for carcinoma- question breast primary [based on  immunohistochemistry & Cancer type ID]; status post cycle 3 of carbo-taxol-clinical response noted. December 2017 -CT scan shows partial response; except for ? New mediastinal adenopathy/right hilar lymph node. Clinically increased in left neck adenopathy noted.  # Proceed with Taxol chemotherapy today. Labs okay.  # Left thigh pain- rule out DVT. Check Dopplers.  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 Evaluated by Dr.Crystal; await for Rt for now. Not taking any pain medications.  # Follow-up in one week; CT of the neck and chest 1-2 days prior. After the results of the CT scan will review with Dr.carey.     Cammie Sickle, MD 12/06/2016 6:23 PM

## 2016-12-06 NOTE — Progress Notes (Signed)
U/s dept called report for lower extremity doppler at 1640- Negative for dvt- evidence of enlarged lymph nodes in groin.

## 2016-12-06 NOTE — Assessment & Plan Note (Addendum)
#   Status post excisional lymph node biopsy- positive for carcinoma- question breast primary [based on immunohistochemistry & Cancer type ID]; status post cycle 3 of carbo-taxol-clinical response noted. December 2017 -CT scan shows partial response; except for ? New mediastinal adenopathy/right hilar lymph node. Clinically increased in left neck adenopathy noted.  # Proceed with Taxol chemotherapy today. Labs okay.  # Left thigh pain- rule out DVT. Check Dopplers.  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 Evaluated by Dr.Crystal; await for Rt for now. Not taking any pain medications.  # Follow-up in one week; chemo; labs; CT of the neck and chest 1-2 days prior. After the results of the CT scan will review with Dr.carey.

## 2016-12-06 NOTE — Progress Notes (Signed)
Patient here today for follow up.  Patient c/o left thigh tenderness, started 2 days ago

## 2016-12-07 ENCOUNTER — Ambulatory Visit: Payer: 59 | Admitting: Radiation Oncology

## 2016-12-07 LAB — CANCER ANTIGEN 27.29: CA 27.29: 90.3 U/mL — AB (ref 0.0–38.6)

## 2016-12-08 ENCOUNTER — Encounter: Payer: Self-pay | Admitting: *Deleted

## 2016-12-11 ENCOUNTER — Ambulatory Visit
Admission: RE | Admit: 2016-12-11 | Discharge: 2016-12-11 | Disposition: A | Discharge status: Home / Self Care | Payer: 59 | Source: Ambulatory Visit | Attending: Internal Medicine | Admitting: Internal Medicine

## 2016-12-11 ENCOUNTER — Ambulatory Visit: Admission: RE | Admit: 2016-12-11 | Payer: 59 | Source: Ambulatory Visit

## 2016-12-11 DIAGNOSIS — C7951 Secondary malignant neoplasm of bone: Secondary | ICD-10-CM | POA: Insufficient documentation

## 2016-12-11 DIAGNOSIS — R590 Localized enlarged lymph nodes: Secondary | ICD-10-CM | POA: Insufficient documentation

## 2016-12-11 DIAGNOSIS — C801 Malignant (primary) neoplasm, unspecified: Secondary | ICD-10-CM | POA: Insufficient documentation

## 2016-12-11 MED ORDER — IOPAMIDOL (ISOVUE-300) INJECTION 61%
75.0000 mL | Freq: Once | INTRAVENOUS | Status: AC | PRN
  Administered 2016-12-11: 75 mL via INTRAVENOUS

## 2016-12-12 ENCOUNTER — Ambulatory Visit
Admission: RE | Admit: 2016-12-12 | Discharge: 2016-12-12 | Disposition: A | Discharge status: Home / Self Care | Payer: 59 | Source: Ambulatory Visit | Attending: Internal Medicine | Admitting: Internal Medicine

## 2016-12-12 ENCOUNTER — Ambulatory Visit: Payer: 59

## 2016-12-12 DIAGNOSIS — C801 Malignant (primary) neoplasm, unspecified: Secondary | ICD-10-CM | POA: Diagnosis not present

## 2016-12-12 DIAGNOSIS — Z79899 Other long term (current) drug therapy: Secondary | ICD-10-CM | POA: Insufficient documentation

## 2016-12-12 DIAGNOSIS — Z5181 Encounter for therapeutic drug level monitoring: Secondary | ICD-10-CM | POA: Insufficient documentation

## 2016-12-12 MED ORDER — TECHNETIUM TC 99M-LABELED RED BLOOD CELLS IV KIT
20.0000 | PACK | Freq: Once | INTRAVENOUS | Status: AC | PRN
  Administered 2016-12-12: 21.59 via INTRAVENOUS

## 2016-12-13 ENCOUNTER — Inpatient Hospital Stay: Payer: 59

## 2016-12-13 ENCOUNTER — Inpatient Hospital Stay (HOSPITAL_BASED_OUTPATIENT_CLINIC_OR_DEPARTMENT_OTHER): Payer: 59 | Admitting: Internal Medicine

## 2016-12-13 ENCOUNTER — Encounter: Payer: Self-pay | Admitting: Internal Medicine

## 2016-12-13 VITALS — BP 123/83 | HR 94 | Temp 97.9°F | Resp 18 | Ht 65.0 in | Wt 124.4 lb

## 2016-12-13 DIAGNOSIS — Z7189 Other specified counseling: Secondary | ICD-10-CM | POA: Insufficient documentation

## 2016-12-13 DIAGNOSIS — C7951 Secondary malignant neoplasm of bone: Secondary | ICD-10-CM

## 2016-12-13 DIAGNOSIS — C801 Malignant (primary) neoplasm, unspecified: Secondary | ICD-10-CM

## 2016-12-13 DIAGNOSIS — C778 Secondary and unspecified malignant neoplasm of lymph nodes of multiple regions: Secondary | ICD-10-CM

## 2016-12-13 DIAGNOSIS — Z79899 Other long term (current) drug therapy: Secondary | ICD-10-CM

## 2016-12-13 DIAGNOSIS — G629 Polyneuropathy, unspecified: Secondary | ICD-10-CM

## 2016-12-13 DIAGNOSIS — M79652 Pain in left thigh: Secondary | ICD-10-CM | POA: Diagnosis not present

## 2016-12-13 DIAGNOSIS — Z5111 Encounter for antineoplastic chemotherapy: Secondary | ICD-10-CM | POA: Diagnosis not present

## 2016-12-13 DIAGNOSIS — R590 Localized enlarged lymph nodes: Secondary | ICD-10-CM | POA: Diagnosis not present

## 2016-12-13 LAB — COMPREHENSIVE METABOLIC PANEL
ALBUMIN: 4.3 g/dL (ref 3.5–5.0)
ALK PHOS: 104 U/L (ref 38–126)
ALT: 28 U/L (ref 14–54)
AST: 38 U/L (ref 15–41)
Anion gap: 6 (ref 5–15)
BILIRUBIN TOTAL: 0.6 mg/dL (ref 0.3–1.2)
BUN: 9 mg/dL (ref 6–20)
CALCIUM: 9.4 mg/dL (ref 8.9–10.3)
CO2: 28 mmol/L (ref 22–32)
Chloride: 105 mmol/L (ref 101–111)
Creatinine, Ser: 0.46 mg/dL (ref 0.44–1.00)
GFR calc Af Amer: >60 mL/min (ref >60)
GLUCOSE: 114 mg/dL — AB (ref 65–99)
POTASSIUM: 3.8 mmol/L (ref 3.5–5.1)
Sodium: 139 mmol/L (ref 135–145)
TOTAL PROTEIN: 7.2 g/dL (ref 6.5–8.1)

## 2016-12-13 LAB — CBC WITH DIFFERENTIAL/PLATELET
BASOS ABS: 0 10*3/uL (ref 0–0.1)
Basophils Relative: 1 %
Eosinophils Absolute: 0.2 10*3/uL (ref 0–0.7)
Eosinophils Relative: 10 %
HEMATOCRIT: 32.9 % — AB (ref 35.0–47.0)
HEMOGLOBIN: 11.5 g/dL — AB (ref 12.0–16.0)
Lymphocytes Relative: 22 %
Lymphs Abs: 0.5 10*3/uL — ABNORMAL LOW (ref 1.0–3.6)
MCH: 32.5 pg (ref 26.0–34.0)
MCHC: 35 g/dL (ref 32.0–36.0)
MCV: 92.6 fL (ref 80.0–100.0)
MONO ABS: 0.2 10*3/uL (ref 0.2–0.9)
MONOS PCT: 9 %
NEUTROS ABS: 1.3 10*3/uL — AB (ref 1.4–6.5)
NEUTROS PCT: 58 %
Platelets: 219 10*3/uL (ref 150–440)
RBC: 3.55 MIL/uL — ABNORMAL LOW (ref 3.80–5.20)
RDW: 15.9 % — AB (ref 11.5–14.5)
WBC: 2.2 10*3/uL — ABNORMAL LOW (ref 3.6–11.0)

## 2016-12-13 MED ORDER — CEPHALEXIN 500 MG PO CAPS: 500.0000 mg | ORAL_CAPSULE | Freq: Three times a day (TID) | ORAL | 0 refills | Status: DC

## 2016-12-13 MED ORDER — HEPARIN SOD (PORK) LOCK FLUSH 100 UNIT/ML IV SOLN
500.0000 [IU] | Freq: Once | INTRAVENOUS | Status: AC
  Administered 2016-12-13: 500 [IU] via INTRAVENOUS

## 2016-12-13 MED ORDER — HEPARIN SOD (PORK) LOCK FLUSH 100 UNIT/ML IV SOLN
INTRAVENOUS | Status: AC
  Filled 2016-12-13: qty 5

## 2016-12-13 NOTE — Progress Notes (Signed)
Pt states she is eating well, BM with help of her medications. She has redness of her chest at the site of port but it was red prior to the port accessed, she also states a tender feeling and feels like left neck node is harder.

## 2016-12-13 NOTE — Progress Notes (Signed)
[place] NOTE  Patient Care Team: Rusty Aus, MD as PCP - General (Internal Medicine)  CHIEF COMPLAINTS/PURPOSE OF CONSULTATION:   Oncology History   # SEP 2017- LYMPHADENOPATHY- extensive- abdominal/RP/ bil neck LN R>L. 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   # Bone mets- X-geva q 6 W  # MOLECULAR TESTING- ER/PR/her 2 NEG; MSI-STABLE. OCT 2017- MRI Bil Breast NEG. PDL-1- NEG [0%]- Found One [NOV 2017]- PTEN **[others]  # Lung RUL nodule [benign s/p resection; 2009- Dr.Oaks]  # spring 2017- colonoscopy [Dr.Elliot; polyps]; June mammo-NEG     Primary cancer of unknown site [facility])   08/25/2016 Initial Diagnosis    Primary cancer of unknown site [facility])       Oncology History   # SEP 2017- LYMPHADENOPATHY- extensive- abdominal/RP/ bil neck LN R>L. 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   # Bone mets- X-geva q 6 W  # MOLECULAR TESTING- ER/PR/her 2 NEG; MSI-STABLE. OCT 2017- MRI Bil Breast NEG. PDL-1- NEG [0%]- Found One [NOV 2017]- PTEN **[others]  # Lung RUL nodule [benign s/p resection; 2009- Dr.Oaks]  # spring 2017- colonoscopy [Dr.Elliot; polyps]; June mammo-NEG     Primary cancer of unknown site [facility])   08/25/2016 Initial Diagnosis    Primary cancer of unknown site [facility])       HISTORY OF PRESENTING ILLNESS:  Sylvia Jones 61 y.o.  female with a history of carcinoma of unknown primary [possible breast cancer based on IHC] status post cycle #4 Carbotaxol; and again Taxol alone last week is here to review the  results of her CT scan that was ordered for increasing size of the left neck lymph node.   Patient complains of mild tightness in the left neck. She also complains of mild erythema around the port site. Denies any fevers or chills. Denies any headaches. She denies any pain.   Otherwise no cough. Mild fatigue after chemotherapy. Currently improved. Mild tingling and numbness of the extremities.  She is walking herself.  No significant nausea vomiting. No chest pain or shortness of breath.   ROS: A complete 10 point review of system is done which is negative except mentioned above in history of present illness  MEDICAL HISTORY:  Past Medical History:  Diagnosis Date  . Arthritis   . Cancer ([place])    lymph nodes, in process of diagnosing  . Cervical disc disease    upper cervical, denied numbness / tingling BUE  . Cervical stenosis of spine   . Fracture 04/2016   left pubis bone-atypical   . Lymphadenopathy 09/05/2016   R clavical region  . Neuromuscular disorder ([place])    compressed disc in neck when 73 or 61 years old  . Post-menopausal 2011  . Primary cancer of unknown site [facility])   . Tubular adenoma     SURGICAL HISTORY: Past Surgical History:  Procedure Laterality Date  . APPENDECTOMY  1986  . BREAST BIOPSY Right    at age 65  . CESAREAN SECTION  1988  . CESAREAN SECTION  1988  . COLONOSCOPY WITH PROPOFOL N/A 07/12/2015  Procedure: COLONOSCOPY WITH PROPOFOL;  Surgeon: Manya Silvas, MD;  Location: [facility] ENDOSCOPY;  Service: Endoscopy;  Laterality: N/A;  . EXPLORATORY LAPAROTOMY  1986  . LYMPH GLAND EXCISION    . MASS BIOPSY N/A 09/05/2016   Procedure: NECK MASS BIOPSY;  Surgeon: Beverly Gust, MD;  Location: [facility] ORS;  Service: ENT;  Laterality: N/A;  . MASS EXCISION  1986   to remove mass outside the uterus. procedure was combined tacked uterus, reposition of fallopian tubes   . PORTACATH PLACEMENT Left 09/20/2016   Procedure: INSERTION PORT-A-CATH;  Surgeon: Olean Ree, MD;  Location: [facility] ORS;  Service: General;  Laterality: Left;  . THORACOTOMY Right 03/2008   RLL Wedge Resection- Dr. 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 Marital status: Married    Spouse name: N/A  . Number of children: N/A  . Years of education: N/A   Occupational History  . Not on file.   Social History Main Topics  . Smoking status: Never Smoker  . Smokeless tobacco: Never Used  . Alcohol use No  . Drug use: No  . Sexual activity: Yes   Other Topics Concern  . Not on file   Social History Narrative  . No narrative on file    FAMILY HISTORY: father- ? Cancer died 15. .. Brother- kasposi sarcoma Family History  Problem Relation Age of Onset  . Cancer Father     medistinal  carcinoma  . Heart attack Father   . Hypertension Brother   . Diabetes Maternal Grandmother   . Diabetes Paternal Grandmother   . Cancer Brother     Capa C sarcoma    ALLERGIES:  is allergic to sulfa antibiotics.  MEDICATIONS:  Current Outpatient Prescriptions  Medication Sig Dispense Refill  . Calcium Carb-Cholecalciferol (CALCIUM 600+D3 PO) Take 1 Dose by mouth daily.     Marland Kitchen lactulose (CEPHULAC) 10 g packet Take 10 grams three times daily for the next 2 days. Then 10 grams daily, can increase to 10 grams three times a day for future concerns for worsening constipation 30 each 0  . polyethylene glycol (MIRALAX / GLYCOLAX) packet Take 17 g by mouth daily.    . Sennosides (SENNA LAX PO) Take 1 Dose by mouth daily as needed.     . cephALEXin (KEFLEX) 500 MG capsule Take 1 capsule (500 mg total) by mouth 3 (three) times daily. 30 capsule 0   No current facility-administered medications for this visit.    Facility-Administered Medications Ordered in Other Visits  Medication Dose Route Frequency Provider Last Rate Last Dose  . Influenza vac split quadrivalent PF (FLUARIX)  injection 0.5 mL  0.5 mL Intramuscular Once Cammie Sickle, MD          .  PHYSICAL EXAMINATION: ECOG PERFORMANCE STATUS: 1 - Symptomatic but completely ambulatory  Vitals:   12/13/16 1042  BP: 123/83  Pulse: 94  Resp: 18  Temp: 97.9 F (36.6 C)   Filed Weights   12/13/16 1042  Weight: 124 lb 6.4 oz (56.4 kg)    GENERAL: Well-nourished well-developed; Alert, no distress and comfortable.   With husband/Son. Walking herself. EYES: no pallor or icterus OROPHARYNX: no thrush or ulceration; good dentition  NECK: supple, no masses felt LYMPH:  Matted lymphadenopathy noted in the left side of the neck [slightly worsened]   LUNGS: clear to auscultation and  No wheeze  or crackles HEART/CVS: regular rate & rhythm and no murmurs; No lower extremity edema. ABDOMEN: abdomen soft, non-tender and normal bowel sounds Musculoskeletal:no cyanosis of digits and no clubbing  PSYCH: alert & oriented x 3 with fluent speech NEURO: no focal motor/sensory deficits SKIN:  no rashes or significant lesions; mild erythema noted around the port site.    LABORATORY DATA:  I have reviewed the data as listed Lab Results  Component Value Date   WBC 2.2 (L) 12/13/2016   HGB 11.5 (L) 12/13/2016   HCT 32.9 (L) 12/13/2016   MCV 92.6 12/13/2016   PLT 219 12/13/2016    Recent Labs  11/13/16 0904 12/06/16 0821 12/13/16 1015  NA 137 138 139  K 3.8 3.6 3.8  CL 105 106 105  CO2 26 26 28   GLUCOSE 91 115* 114*  BUN 8 10 9   CREATININE 0.39* 0.31* 0.46  CALCIUM 9.1 8.9 9.4  GFRNONAA >60 >60 >60  GFRAA >60 >60 >60  PROT 7.3 7.2 7.2  ALBUMIN 3.9 3.9 4.3  AST 40 33 38  ALT 31 25 28   ALKPHOS 110 112 104  BILITOT 0.6 0.7 0.6    RADIOGRAPHIC STUDIES: I have personally reviewed the radiological images as listed and agreed with the findings in the report. Ct Soft Tissue Neck W Contrast  Result Date: 12/11/2016 CLINICAL DATA:  61 year old female with metastatic lymphadenopathy in the neck  abdomen and pelvis, as well as lytic bone metastases. Adenocarcinoma of unknown primary. Left neck swelling. Subsequent encounter. EXAM: CT NECK WITH CONTRAST TECHNIQUE: Multidetector CT imaging of the neck was performed using the standard protocol following the bolus administration of intravenous contrast. CONTRAST:  38m ISOVUE-300 IOPAMIDOL (ISOVUE-300) INJECTION 61% COMPARISON:  Neck CT 11/10/2016 and earlier. FINDINGS: Pharynx and larynx: Larynx and pharynx soft tissue contours remain normal. Negative parapharyngeal and retropharyngeal spaces. Salivary glands: Negative sublingual space, submandibular glands and parotid glands. Thyroid: Negative. Lymph nodes: There has been some recurrence of a 6-7 mm left lower level 3 or level 4 lymph node which is located near a marked area of palpable concern (series 6, image 67 versus skin marker are on image 58). Compare also coronal image 51 today to coronal image 59 previously. The other small nodular lymph node changes and soft tissue stranding in the region appear stable. No other abnormal level 4 lymph node. Right level 3 nodes remain resolved. Left level 3 nodes are increased in conspicuity, but are subtle primarily recognized by there are increasing mass effect on the left jugular vein (series 6 images 56-65). These measure 5-6 mm short axis. A left level IIa node has slowly progressed since September and is now nearly 10 mm short axis, versus 6-7 mm previously (series 6, image 40 today versus series 8, image 36 previously. Similarly cluster of left level 2 B nodes remains mildly enlarged an appears perhaps slightly increased since December (series 6 images 30 through 36). These nodes measures up to 6-7 mm. However, contralateral right level 2 nodes remain regressed. Vascular: Major vascular structures in the neck and at the skullbase remain patent, including the left IJ. Limited intracranial: Negative. Visualized orbits: Negative. Mastoids and visualized paranasal  sinuses: Clear. Skeleton: Sclerotic metastasis of the C7 spinous process with healing pathologic fracture re- demonstrated (sagittal image 42). Other cervical vertebrae appear stable. No skullbase bone metastasis identified. Stable heterogeneity in the T2 vertebral body compatible with bone metastases. There is sclerosis of the left posterior first ribs suspicious for metastatic disease which appears stable. Upper  chest: Reported separately today. IMPRESSION: 1. Mild progression of left neck lymph node disease suspected: - Stable to mildly increased left Level 2 and left level 3 nodal size since December. - Small recurrent 6-7 mm lymph node in the left lower neck is located near a marked area of palpable concern. 2. No new metastatic disease identified in the neck. 3. Osseous metastatic disease to the C7 spinous process, left posterior first rib and T2 vertebral body appears stable. 4. See also CT Chest today reported separately. Electronically Signed   By: Genevie Ann M.D.   On: 12/11/2016 15:42   Ct Chest W Contrast  Result Date: 12/11/2016 CLINICAL DATA:  Adenocarcinoma of unknown primary. Metastatic adenopathy. Bone metastasis EXAM: CT CHEST WITH CONTRAST TECHNIQUE: Multidetector CT imaging of the chest was performed during intravenous contrast administration. CONTRAST:  72m ISOVUE-300 IOPAMIDOL (ISOVUE-300) INJECTION 61% COMPARISON:  Neck CT same day, CT thorax 11/10/2016 FINDINGS: Cardiovascular: Port in the LEFT chest wall. No central pulmonary embolism. No pericardial fluid. Mediastinum/Nodes: No axillary lymphadenopathy. No supraclavicular adenopathy. Enlarged subcarinal lymph node along the bronchus intermedius measures 20 mm (image 64, series 3) similar to 19 mm on comparison exam. RIGHT hilar lymph node measures 15 mm unchanged. Lungs/Pleura: Linear pleuroparenchymal thickening at the RIGHT lung base is unchanged. No suspicious nodularity. Upper Abdomen: Limited view of the liver, kidneys, pancreas are  unremarkable. Normal adrenal glands. Musculoskeletal: Sclerotic lesion of the medial LEFT first rib again noted. Sclerotic lesion the T2 vertebral body also noted (image 23, series 3). No new lesions identified. IMPRESSION: 1. Subcarinal mediastinal lymph node measures 1 mm larger. 2. No pulmonary metastasis. 3. Stable sclerotic lesion in the left first rib and T2 vertebral body Electronically Signed   By: SSuzy BouchardM.D.   On: 12/11/2016 17:24   Nm Cardiac Muga Rest  Result Date: 12/12/2016 CLINICAL DATA:  Encounter for monitoring cardiotoxic drug therapy EXAM: NUCLEAR MEDICINE CARDIAC BLOOD POOL IMAGING (MUGA) TECHNIQUE: Cardiac multi-gated acquisition was performed at rest following intravenous injection of Tc-958mabeled red blood cells. RADIOPHARMACEUTICALS:  21.6 mCi Tc-9965mP in-vitro labeled red blood cells IV COMPARISON:  07/16/2013 FINDINGS: The left ventricular ejection fraction is equal to 71.1%. Previously 70%. There is normal wall motion. IMPRESSION: 1. Normal left ventricular ejection fraction equal to 71.1%. Electronically Signed   By: TayKerby MoorsD.   On: 12/12/2016 15:59   Us Koreanous Img Lower Unilateral Left  Result Date: 12/06/2016 CLINICAL DATA:  Left leg swelling for 2 days EXAM: LEFT LOWER EXTREMITY VENOUS DUPLEX ULTRASOUND TECHNIQUE: Doppler venous assessment of the left lower extremity deep venous system was performed, including characterization of spectral flow, compressibility, and phasicity. COMPARISON:  None. FINDINGS: There is complete compressibility of the left common femoral, femoral, and popliteal veins. Doppler analysis demonstrates respiratory phasicity and augmentation of flow with calf compression. No obvious superficial vein or calf vein thrombosis. There is an abnormally enlarged mass in the left inguinal region measuring 1.4 x 1.2 x 1.1 cm. It is hypoechoic and bulb which JeaEdmonia Lynch without a fatty hilum. IMPRESSION: No evidence of left lower extremity DVT.  Abnormal hypoechoic mass in the left inguinal region is nonspecific and may represent an pathologic lymph node. Malignancy or inflammatory process are not excluded. Electronically Signed   By: ArtMarybelle KillingsD.   On: 12/06/2016 16:36    ASSESSMENT & PLAN:   [facility] Status post excisional lymph node biopsy- positive for carcinoma- question breast primary [based on  immunohistochemistry & Cancer type ID]; status post cycle 4 of carbo-taxol. Restaging JAN 15th Neck CT scan shows- progressive disease especially in the left neck. CT of the chest showed no significant increase in the size of the mediastinal adenopathy.  # Given the progressive disease on current therapy- I recommend evaluation for clinical trial at [facility]. Left messages for Dr. Jeani Hawking. Also recommend patient/family to contact [facility]  # From my standpoint I would recommend Adriamycin Cytoxan every 3 weeks[ given the aggressive nature of the disease]. MUGA scan-70% ejection fraction. HOLD Chemo today.   # # Reviewed/counselled regarding the goals of care- being palliative/treatment are usually indefinite-until progression or side effects. Goal is to maintain quality of life as the disease is incurable.  # Skeletal metastases- on X-geva [last 1 week ago]; significant improvement in pain. Not on pain medications.  # Mild erythema around the port site- dermatitis; clinically less likely cellulitis- given a prescription of Keflex not to use unless the erythema gets worse.  # follow up in 1 week/23rd for [facility] with onpro; CBC; follow up in 10 days post chemo/labs. If any new recommendations from [facility]- will change plans accordingly.  # # I reviewed the blood work- with the patient in detail; also reviewed the imaging independently [as summarized above]; and with the patient in detail.     Cammie Sickle, MD 12/13/2016 12:27 PM

## 2016-12-13 NOTE — Progress Notes (Signed)
DISCONTINUE OFF PATHWAY REGIMEN - [Other Dx]  Paclitaxel 70 mg/m2 3 weeks on, 1 week off  [id number]:Paclitaxel 70 mg/m2 3 weeks on, 1 week off:   A cycle is q28days (3 weeks on and 1 week off):     Paclitaxel (Taxol(R)) 70 mg/m2 in 250 mL NS IV over 1 hour days 1,8,15 Dose Mod: None  **Always confirm dose/schedule in your pharmacy ordering system**    REASON: Disease Progression PRIOR TREATMENT: Paclitaxel 70 mg/m2 3 weeks on, 1 week off TREATMENT RESPONSE: Progressive Disease (PD)  START OFF PATHWAY REGIMEN - [Other Dx]  Doxorubicin + Cyclophosphamide (AC)  [id number]:Doxorubicin + Cyclophosphamide (AC):   A cycle is every 21 days:     Doxorubicin (Adriamycin(R)) 60 mg/m2 IV Push followed by Dose Mod: None     Cyclophosphamide (Cytoxan(R)) 600 mg/m2 in 250 mL NS IV over 30 minutes Dose Mod: None  **Always confirm dose/schedule in your pharmacy ordering system**    Intent of Therapy: Non-Curative / Palliative Intent, Discussed with Patient

## 2016-12-13 NOTE — Assessment & Plan Note (Addendum)
#   Status post excisional lymph node biopsy- positive for carcinoma- question breast primary [based on immunohistochemistry & Cancer type ID]; status post cycle 4 of carbo-taxol. Restaging JAN 15th Neck CT scan shows- progressive disease especially in the left neck. CT of the chest showed no significant increase in the size of the mediastinal adenopathy.  # Given the progressive disease on current therapy- I recommend evaluation for clinical trial at [facility]. Left messages for Dr. Jeani Hawking. Also recommend patient/family to contact [facility]  # From my standpoint I would recommend Adriamycin Cytoxan every 3 weeks[ given the aggressive nature of the disease]. MUGA scan-70% ejection fraction. HOLD Chemo today.   # # Reviewed/counselled regarding the goals of care- being palliative/treatment are usually indefinite-until progression or side effects. Goal is to maintain quality of life as the disease is incurable.  # Skeletal metastases- on X-geva [last 1 week ago]; significant improvement in pain. Not on pain medications.  # Mild erythema around the port site- dermatitis; clinically less likely cellulitis- given a prescription of Keflex not to use unless the erythema gets worse.  # follow up in 1 week/23rd for [facility] with onpro; CBC; follow up in 10 days post chemo/labs. If any new recommendations from [facility]- will change plans accordingly.  # # I reviewed the blood work- with the patient in detail; also reviewed the imaging independently [as summarized above]; and with the patient in detail.

## 2016-12-15 ENCOUNTER — Telehealth: Payer: Self-pay | Admitting: Internal Medicine

## 2016-12-15 NOTE — Telephone Encounter (Signed)
Spoke to the patient/husband- proceed with Adriamycin Cytoxan for now; clinical trial options open for future. Communicated in emails with Dr.Carey; [place].

## 2016-12-18 ENCOUNTER — Encounter: Payer: Self-pay | Admitting: Internal Medicine

## 2016-12-19 ENCOUNTER — Inpatient Hospital Stay: Payer: 59

## 2016-12-19 ENCOUNTER — Other Ambulatory Visit: Payer: Self-pay | Admitting: Internal Medicine

## 2016-12-19 VITALS — BP 120/75 | HR 82 | Temp 98.2°F | Resp 16

## 2016-12-19 DIAGNOSIS — C778 Secondary and unspecified malignant neoplasm of lymph nodes of multiple regions: Secondary | ICD-10-CM | POA: Diagnosis not present

## 2016-12-19 DIAGNOSIS — C7951 Secondary malignant neoplasm of bone: Secondary | ICD-10-CM | POA: Diagnosis not present

## 2016-12-19 DIAGNOSIS — G629 Polyneuropathy, unspecified: Secondary | ICD-10-CM | POA: Diagnosis not present

## 2016-12-19 DIAGNOSIS — R110 Nausea: Secondary | ICD-10-CM

## 2016-12-19 DIAGNOSIS — M79652 Pain in left thigh: Secondary | ICD-10-CM | POA: Diagnosis not present

## 2016-12-19 DIAGNOSIS — T451X5A Adverse effect of antineoplastic and immunosuppressive drugs, initial encounter: Principal | ICD-10-CM

## 2016-12-19 DIAGNOSIS — C801 Malignant (primary) neoplasm, unspecified: Secondary | ICD-10-CM | POA: Diagnosis not present

## 2016-12-19 DIAGNOSIS — R590 Localized enlarged lymph nodes: Secondary | ICD-10-CM | POA: Diagnosis not present

## 2016-12-19 DIAGNOSIS — Z79899 Other long term (current) drug therapy: Secondary | ICD-10-CM | POA: Diagnosis not present

## 2016-12-19 DIAGNOSIS — Z5111 Encounter for antineoplastic chemotherapy: Secondary | ICD-10-CM | POA: Diagnosis not present

## 2016-12-19 LAB — CBC WITH DIFFERENTIAL/PLATELET
BASOS PCT: 1 %
Basophils Absolute: 0 10*3/uL (ref 0–0.1)
Eosinophils Absolute: 0.1 10*3/uL (ref 0–0.7)
Eosinophils Relative: 6 %
HEMATOCRIT: 33.4 % — AB (ref 35.0–47.0)
HEMOGLOBIN: 11.5 g/dL — AB (ref 12.0–16.0)
LYMPHS PCT: 23 %
Lymphs Abs: 0.6 10*3/uL — ABNORMAL LOW (ref 1.0–3.6)
MCH: 32 pg (ref 26.0–34.0)
MCHC: 34.3 g/dL (ref 32.0–36.0)
MCV: 93.1 fL (ref 80.0–100.0)
Monocytes Absolute: 0.3 10*3/uL (ref 0.2–0.9)
Monocytes Relative: 11 %
NEUTROS ABS: 1.4 10*3/uL (ref 1.4–6.5)
NEUTROS PCT: 59 %
Platelets: 202 10*3/uL (ref 150–440)
RBC: 3.59 MIL/uL — ABNORMAL LOW (ref 3.80–5.20)
RDW: 15.4 % — ABNORMAL HIGH (ref 11.5–14.5)
WBC: 2.4 10*3/uL — ABNORMAL LOW (ref 3.6–11.0)

## 2016-12-19 MED ORDER — PALONOSETRON HCL INJECTION 0.25 MG/5ML
0.2500 mg | Freq: Once | INTRAVENOUS | Status: AC
  Administered 2016-12-19: 0.25 mg via INTRAVENOUS
  Filled 2016-12-19: qty 5

## 2016-12-19 MED ORDER — SODIUM CHLORIDE 0.9 % IV SOLN
Freq: Once | INTRAVENOUS | Status: AC
  Administered 2016-12-19: 10:00:00 via INTRAVENOUS
  Filled 2016-12-19: qty 1000

## 2016-12-19 MED ORDER — SODIUM CHLORIDE 0.9 % IV SOLN
Freq: Once | INTRAVENOUS | Status: AC
  Administered 2016-12-19: 11:00:00 via INTRAVENOUS
  Filled 2016-12-19: qty 5

## 2016-12-19 MED ORDER — SODIUM CHLORIDE 0.9 % IV SOLN
1000.0000 mg | Freq: Once | INTRAVENOUS | Status: AC
  Administered 2016-12-19: 1000 mg via INTRAVENOUS
  Filled 2016-12-19: qty 50

## 2016-12-19 MED ORDER — SODIUM CHLORIDE 0.9% FLUSH
10.0000 mL | INTRAVENOUS | Status: DC | PRN
  Administered 2016-12-19: 10 mL
  Filled 2016-12-19: qty 10

## 2016-12-19 MED ORDER — PEGFILGRASTIM 6 MG/0.6ML ~~LOC~~ PSKT
6.0000 mg | PREFILLED_SYRINGE | Freq: Once | SUBCUTANEOUS | Status: AC
  Administered 2016-12-19: 6 mg via SUBCUTANEOUS
  Filled 2016-12-19: qty 0.6

## 2016-12-19 MED ORDER — HEPARIN SOD (PORK) LOCK FLUSH 100 UNIT/ML IV SOLN
500.0000 [IU] | Freq: Once | INTRAVENOUS | Status: AC | PRN
  Administered 2016-12-19: 500 [IU]
  Filled 2016-12-19: qty 5

## 2016-12-19 MED ORDER — DOXORUBICIN HCL CHEMO IV INJECTION 2 MG/ML
60.0000 mg/m2 | Freq: Once | INTRAVENOUS | Status: AC
  Administered 2016-12-19: 96 mg via INTRAVENOUS
  Filled 2016-12-19: qty 48

## 2016-12-19 NOTE — Progress Notes (Signed)
Pt's ANC 1.4 today. Per Dr Rogue Bussing may proceed with treatment today

## 2016-12-21 ENCOUNTER — Telehealth: Payer: Self-pay | Admitting: Internal Medicine

## 2016-12-21 NOTE — Telephone Encounter (Signed)
RN replied back to patient via My chart msg

## 2016-12-21 NOTE — Telephone Encounter (Signed)
Please inform pt that I got the "strata oncology"- report from [facility]; Dr.carey's office. NO mutations were found as per the test. So, please inform that I will talk to her about the report in detail at the next appointment. Currently, continue treatment as planned. Dr.B

## 2016-12-27 ENCOUNTER — Other Ambulatory Visit: Payer: Self-pay | Admitting: *Deleted

## 2016-12-27 DIAGNOSIS — C801 Malignant (primary) neoplasm, unspecified: Secondary | ICD-10-CM

## 2016-12-28 ENCOUNTER — Inpatient Hospital Stay (HOSPITAL_BASED_OUTPATIENT_CLINIC_OR_DEPARTMENT_OTHER): Payer: 59 | Admitting: Internal Medicine

## 2016-12-28 ENCOUNTER — Other Ambulatory Visit: Payer: Self-pay

## 2016-12-28 ENCOUNTER — Telehealth: Payer: Self-pay | Admitting: *Deleted

## 2016-12-28 ENCOUNTER — Inpatient Hospital Stay: Payer: 59 | Attending: Internal Medicine

## 2016-12-28 VITALS — BP 108/87 | HR 87 | Temp 98.2°F | Wt 123.0 lb

## 2016-12-28 DIAGNOSIS — Z79899 Other long term (current) drug therapy: Secondary | ICD-10-CM | POA: Diagnosis not present

## 2016-12-28 DIAGNOSIS — C7951 Secondary malignant neoplasm of bone: Secondary | ICD-10-CM

## 2016-12-28 DIAGNOSIS — K5900 Constipation, unspecified: Secondary | ICD-10-CM | POA: Diagnosis not present

## 2016-12-28 DIAGNOSIS — C778 Secondary and unspecified malignant neoplasm of lymph nodes of multiple regions: Secondary | ICD-10-CM | POA: Diagnosis not present

## 2016-12-28 DIAGNOSIS — K219 Gastro-esophageal reflux disease without esophagitis: Secondary | ICD-10-CM | POA: Insufficient documentation

## 2016-12-28 DIAGNOSIS — D72819 Decreased white blood cell count, unspecified: Secondary | ICD-10-CM | POA: Insufficient documentation

## 2016-12-28 DIAGNOSIS — C801 Malignant (primary) neoplasm, unspecified: Secondary | ICD-10-CM | POA: Diagnosis not present

## 2016-12-28 DIAGNOSIS — L539 Erythematous condition, unspecified: Secondary | ICD-10-CM | POA: Diagnosis not present

## 2016-12-28 DIAGNOSIS — Z5111 Encounter for antineoplastic chemotherapy: Secondary | ICD-10-CM | POA: Insufficient documentation

## 2016-12-28 DIAGNOSIS — E876 Hypokalemia: Secondary | ICD-10-CM | POA: Insufficient documentation

## 2016-12-28 DIAGNOSIS — Z7689 Persons encountering health services in other specified circumstances: Secondary | ICD-10-CM | POA: Insufficient documentation

## 2016-12-28 LAB — BASIC METABOLIC PANEL
ANION GAP: 6 (ref 5–15)
BUN: 11 mg/dL (ref 6–20)
CO2: 30 mmol/L (ref 22–32)
Calcium: 9.2 mg/dL (ref 8.9–10.3)
Chloride: 103 mmol/L (ref 101–111)
Creatinine, Ser: 0.4 mg/dL — ABNORMAL LOW (ref 0.44–1.00)
Glucose, Bld: 81 mg/dL (ref 65–99)
POTASSIUM: 3.7 mmol/L (ref 3.5–5.1)
SODIUM: 139 mmol/L (ref 135–145)

## 2016-12-28 LAB — CBC WITH DIFFERENTIAL/PLATELET
BASOS ABS: 0 10*3/uL (ref 0–0.1)
Basophils Relative: 2 %
EOS PCT: 4 %
Eosinophils Absolute: 0 10*3/uL (ref 0–0.7)
HCT: 33.4 % — ABNORMAL LOW (ref 35.0–47.0)
HEMOGLOBIN: 11.6 g/dL — AB (ref 12.0–16.0)
LYMPHS PCT: 38 %
Lymphs Abs: 0.3 10*3/uL — ABNORMAL LOW (ref 1.0–3.6)
MCH: 32.3 pg (ref 26.0–34.0)
MCHC: 34.6 g/dL (ref 32.0–36.0)
MCV: 93.2 fL (ref 80.0–100.0)
Monocytes Absolute: 0.1 10*3/uL — ABNORMAL LOW (ref 0.2–0.9)
Monocytes Relative: 11 %
Neutro Abs: 0.3 10*3/uL — ABNORMAL LOW (ref 1.4–6.5)
Neutrophils Relative %: 45 %
PLATELETS: 56 10*3/uL — AB (ref 150–440)
RBC: 3.58 MIL/uL — AB (ref 3.80–5.20)
RDW: 13.8 % (ref 11.5–14.5)
WBC: 0.7 10*3/uL — AB (ref 3.6–11.0)

## 2016-12-28 NOTE — Progress Notes (Signed)
Patient here today for follow up.  Patient states she had a rash on chest, possible reaction to tape, but getting better

## 2016-12-28 NOTE — Progress Notes (Signed)
[facility] NOTE  Patient Care Team: Rusty Aus, MD as PCP - General (Internal Medicine)  CHIEF COMPLAINTS/PURPOSE OF CONSULTATION:   Oncology History   # SEP 2017- LYMPHADENOPATHY- extensive- abdominal/RP/ bil neck LN R>L. 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   # Bone mets- X-geva q 6 W  # MOLECULAR TESTING- ER/PR/her 2 NEG; MSI-STABLE. OCT 2017- MRI Bil Breast NEG. PDL-1- NEG [0%]- Found One [NOV 2017]- PTEN **[others]  # Lung RUL nodule [benign s/p resection; 2009- Dr.Oaks]  # spring 2017- colonoscopy [Dr.Elliot; polyps]; June mammo-NEG     Primary cancer of unknown site [facility])   08/25/2016 Initial Diagnosis    Primary cancer of unknown site [facility])       Oncology History   # SEP 2017- LYMPHADENOPATHY- extensive- abdominal/RP/ bil neck LN R>L. 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   # Bone mets- X-geva q 6 W  # MOLECULAR TESTING- ER/PR/her 2 NEG; MSI-STABLE. OCT 2017- MRI Bil Breast NEG. PDL-1- NEG [0%]- Found One [NOV 2017]- PTEN **[others]  # Lung RUL nodule [benign s/p resection; 2009- Dr.Oaks]  # spring 2017- colonoscopy [Dr.Elliot; polyps]; June mammo-NEG     Primary cancer of unknown site [facility])   08/25/2016 Initial Diagnosis    Primary cancer of unknown site [facility])       HISTORY OF PRESENTING ILLNESS:  Sylvia Jones 61 y.o.  female with a history of carcinoma of unknown primary [possible breast cancer based on IHC] Chemotherapy with Adriamycin Cytoxan every 3 weeks status post cycle #1 approximately 10  days ago.   No fevers no chills. Complains of mild constipation. Otherwise no nausea vomiting. No distress mild improvement of the left-sided neck tightness. No significant improvement in the lymphadenopathy.  Denies any headaches. She denies any pain. Otherwise no cough. Mild fatigue after chemotherapy. Currently improved. Mild tingling and numbness of the extremities.  She is walking herself.   No chest pain or shortness of breath.   ROS: A complete 10 point review of system is done which is negative except mentioned above in history of present illness  MEDICAL HISTORY:  Past Medical History:  Diagnosis Date  . Arthritis   . Cancer ([place])    lymph nodes, in process of diagnosing  . Cervical disc disease    upper cervical, denied numbness / tingling BUE  . Cervical stenosis of spine   . Fracture 04/2016   left pubis bone-atypical   . Lymphadenopathy 09/05/2016   R clavical region  . Neuromuscular disorder ([place])    compressed disc in neck when 37 or 61 years old  . Post-menopausal 2011  . Primary cancer of unknown site [facility])   . Tubular adenoma     SURGICAL HISTORY: Past Surgical History:  Procedure Laterality Date  . APPENDECTOMY  1986  . BREAST BIOPSY Right    at age 27  . CESAREAN SECTION  1988  . CESAREAN SECTION  1988  . COLONOSCOPY WITH PROPOFOL N/A 07/12/2015   Procedure: COLONOSCOPY WITH PROPOFOL;  Surgeon: Manya Silvas, MD;  Location: [facility] ENDOSCOPY;  Service: Endoscopy;  Laterality: N/A;  . EXPLORATORY LAPAROTOMY  1986  . LYMPH GLAND EXCISION    . MASS BIOPSY N/A 09/05/2016   Procedure: NECK MASS BIOPSY;  Surgeon: Beverly Gust, MD;  Location: [facility] ORS;  Service: ENT;  Laterality: N/A;  . MASS EXCISION  1986   to remove mass outside the uterus. procedure was combined tacked uterus, reposition of fallopian tubes   . PORTACATH PLACEMENT Left 09/20/2016   Procedure: INSERTION PORT-A-CATH;  Surgeon: Olean Ree, MD;  Location: [facility] ORS;  Service: General;   Laterality: Left;  . THORACOTOMY Right 03/2008   RLL Wedge Resection- Dr. 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 Marital status: Married    Spouse name: N/A  . Number of children: N/A  . Years of education: N/A   Occupational History  . Not on file.   Social History Main Topics  . Smoking status: Never Smoker  . Smokeless tobacco: Never Used  . Alcohol use No  . Drug use: No  . Sexual activity: Yes   Other Topics Concern  . Not on file   Social History Narrative  . No narrative on file    FAMILY HISTORY: father- ? Cancer died 27. .. Brother- kasposi sarcoma Family History  Problem Relation Age of Onset  . Cancer Father     medistinal  carcinoma  . Heart attack Father   . Hypertension Brother   . Diabetes Maternal Grandmother   . Diabetes Paternal Grandmother   . Cancer Brother     Capa C sarcoma    ALLERGIES:  is allergic to sulfa antibiotics.  MEDICATIONS:  Current Outpatient Prescriptions  Medication Sig Dispense Refill  . Calcium Carb-Cholecalciferol (CALCIUM 600+D3 PO) Take 1 Dose by mouth daily.     [place] Kitchen lactulose (CEPHULAC) 10 g packet Take 10 grams three times daily for the next 2 days. Then 10 grams daily, can increase to 10 grams three times a day for future concerns for worsening constipation 30 each 0  . polyethylene glycol (MIRALAX / GLYCOLAX) packet Take 17 g by mouth daily.    . Sennosides (SENNA LAX PO) Take 1 Dose by mouth daily as needed.      No current facility-administered medications for this visit.    Facility-Administered Medications Ordered in Other Visits  Medication Dose Route Frequency Provider Last Rate Last Dose  . Influenza vac split quadrivalent PF (FLUARIX) injection 0.5 mL  0.5 mL Intramuscular Once Cammie Sickle, MD          .  PHYSICAL EXAMINATION: ECOG PERFORMANCE STATUS: 1 - Symptomatic but completely  ambulatory  Vitals:   12/28/16 1101  BP: 108/87  Pulse: 87  Temp: 98.2 F (36.8 C)   Filed Weights   12/28/16 1101  Weight: 123 lb (55.8 kg)    GENERAL: Well-nourished well-developed; Alert, no distress and comfortable.   With husband. Walking herself. EYES: no pallor or icterus OROPHARYNX: no thrush or ulceration; good dentition  NECK: supple, no masses felt LYMPH:  Matted lymphadenopathy noted in the left side of the neck [slightly worsened]   LUNGS: clear to auscultation and  No wheeze or crackles HEART/CVS: regular rate & rhythm and no murmurs; No lower extremity edema. ABDOMEN: abdomen soft, non-tender and normal bowel sounds Musculoskeletal:no cyanosis of digits and no clubbing  PSYCH: alert & oriented x 3 with fluent speech NEURO: no focal motor/sensory deficits SKIN:  no rashes or significant lesions; mild erythema noted around the  neck    LABORATORY DATA:  I have reviewed the data as listed Lab Results  Component Value Date   WBC 0.7 (LL) 12/28/2016   HGB 11.6 (L) 12/28/2016   HCT 33.4 (L) 12/28/2016   MCV 93.2 12/28/2016   PLT 56 (L) 12/28/2016    Recent Labs  11/13/16 0904 12/06/16 0821 12/13/16 1015 12/28/16 1037  NA 137 138 139 139  K 3.8 3.6 3.8 3.7  CL 105 106 105 103  CO2 _0 GLUCOSE 91 115* 114* 81  BUN _1 CREATININE 0.39* 0.31* 0.46 0.40*  CALCIUM 9.1 8.9 9.4 9.2  GFRNONAA >60 >60 >60 >60  GFRAA >60 >60 >60 >60  PROT 7.3 7.2 7.2  --   ALBUMIN 3.9 3.9 4.3  --   AST 40 33 38  --   ALT _2 --   ALKPHOS 110 112 104  --   BILITOT 0.6 0.7 0.6  --     RADIOGRAPHIC STUDIES: I have personally reviewed the radiological images as listed and agreed with the findings in the report. Ct Soft Tissue Neck W Contrast  Result Date: 12/11/2016 CLINICAL DATA:  61 year old female with metastatic lymphadenopathy in the neck abdomen and pelvis, as well as lytic bone metastases. Adenocarcinoma of unknown primary. Left neck swelling.  Subsequent encounter. EXAM: CT NECK WITH CONTRAST TECHNIQUE: Multidetector CT imaging of the neck was performed using the standard protocol following the bolus administration of intravenous contrast. CONTRAST:  54m ISOVUE-300 IOPAMIDOL (ISOVUE-300) INJECTION 61% COMPARISON:  Neck CT 11/10/2016 and earlier. FINDINGS: Pharynx and larynx: Larynx and pharynx soft tissue contours remain normal. Negative parapharyngeal and retropharyngeal spaces. Salivary glands: Negative sublingual space, submandibular glands and parotid glands. Thyroid: Negative. Lymph nodes: There has been some recurrence of a 6-7 mm left lower level 3 or level 4 lymph node which is located near a marked area of palpable concern (series 6, image 67 versus skin marker are on image 58). Compare also coronal image 51 today to coronal image 59 previously. The other small nodular lymph node changes and soft tissue stranding in the region appear stable. No other abnormal level 4 lymph node. Right level 3 nodes remain resolved. Left level 3 nodes are increased in conspicuity, but are subtle primarily recognized by there are increasing mass effect on the left jugular vein (series 6 images 56-65). These measure 5-6 mm short axis. A left level IIa node has slowly progressed since September and is now nearly 10 mm short axis, versus 6-7 mm previously (series 6, image 40 today versus series 8, image 36 previously. Similarly cluster of left level 2 B nodes remains mildly enlarged an appears perhaps slightly increased since December (series 6 images 30 through 36). These nodes measures up to 6-7 mm. However, contralateral right level 2 nodes remain regressed. Vascular: Major vascular structures in the neck and at the skullbase remain patent, including the left IJ. Limited intracranial: Negative. Visualized orbits: Negative. Mastoids and visualized paranasal sinuses: Clear. Skeleton: Sclerotic metastasis of the C7 spinous process with healing pathologic fracture re-  demonstrated (sagittal image 42). Other cervical vertebrae appear stable. No skullbase bone metastasis identified. Stable heterogeneity in the T2 vertebral body compatible with bone metastases. There is sclerosis of the left posterior first ribs suspicious for metastatic disease which appears stable. Upper chest: Reported separately today. IMPRESSION: 1. Mild progression of left neck lymph node disease suspected: -  Stable to mildly increased left Level 2 and left level 3 nodal size since December. - Small recurrent 6-7 mm lymph node in the left lower neck is located near a marked area of palpable concern. 2. No new metastatic disease identified in the neck. 3. Osseous metastatic disease to the C7 spinous process, left posterior first rib and T2 vertebral body appears stable. 4. See also CT Chest today reported separately. Electronically Signed   By: Genevie Ann M.D.   On: 12/11/2016 15:42   Ct Chest W Contrast  Result Date: 12/11/2016 CLINICAL DATA:  Adenocarcinoma of unknown primary. Metastatic adenopathy. Bone metastasis EXAM: CT CHEST WITH CONTRAST TECHNIQUE: Multidetector CT imaging of the chest was performed during intravenous contrast administration. CONTRAST:  44m ISOVUE-300 IOPAMIDOL (ISOVUE-300) INJECTION 61% COMPARISON:  Neck CT same day, CT thorax 11/10/2016 FINDINGS: Cardiovascular: Port in the LEFT chest wall. No central pulmonary embolism. No pericardial fluid. Mediastinum/Nodes: No axillary lymphadenopathy. No supraclavicular adenopathy. Enlarged subcarinal lymph node along the bronchus intermedius measures 20 mm (image 64, series 3) similar to 19 mm on comparison exam. RIGHT hilar lymph node measures 15 mm unchanged. Lungs/Pleura: Linear pleuroparenchymal thickening at the RIGHT lung base is unchanged. No suspicious nodularity. Upper Abdomen: Limited view of the liver, kidneys, pancreas are unremarkable. Normal adrenal glands. Musculoskeletal: Sclerotic lesion of the medial LEFT first rib again  noted. Sclerotic lesion the T2 vertebral body also noted (image 23, series 3). No new lesions identified. IMPRESSION: 1. Subcarinal mediastinal lymph node measures 1 mm larger. 2. No pulmonary metastasis. 3. Stable sclerotic lesion in the left first rib and T2 vertebral body Electronically Signed   By: SSuzy BouchardM.D.   On: 12/11/2016 17:24   Nm Cardiac Muga Rest  Result Date: 12/12/2016 CLINICAL DATA:  Encounter for monitoring cardiotoxic drug therapy EXAM: NUCLEAR MEDICINE CARDIAC BLOOD POOL IMAGING (MUGA) TECHNIQUE: Cardiac multi-gated acquisition was performed at rest following intravenous injection of Tc-982mabeled red blood cells. RADIOPHARMACEUTICALS:  21.6 mCi Tc-9970mP in-vitro labeled red blood cells IV COMPARISON:  07/16/2013 FINDINGS: The left ventricular ejection fraction is equal to 71.1%. Previously 70%. There is normal wall motion. IMPRESSION: 1. Normal left ventricular ejection fraction equal to 71.1%. Electronically Signed   By: TayKerby MoorsD.   On: 12/12/2016 15:59   Us Koreanous Img Lower Unilateral Left  Result Date: 12/06/2016 CLINICAL DATA:  Left leg swelling for 2 days EXAM: LEFT LOWER EXTREMITY VENOUS DUPLEX ULTRASOUND TECHNIQUE: Doppler venous assessment of the left lower extremity deep venous system was performed, including characterization of spectral flow, compressibility, and phasicity. COMPARISON:  None. FINDINGS: There is complete compressibility of the left common femoral, femoral, and popliteal veins. Doppler analysis demonstrates respiratory phasicity and augmentation of flow with calf compression. No obvious superficial vein or calf vein thrombosis. There is an abnormally enlarged mass in the left inguinal region measuring 1.4 x 1.2 x 1.1 cm. It is hypoechoic and bulb which JeaEdmonia Lynch without a fatty hilum. IMPRESSION: No evidence of left lower extremity DVT. Abnormal hypoechoic mass in the left inguinal region is nonspecific and may represent an pathologic lymph  node. Malignancy or inflammatory process are not excluded. Electronically Signed   By: ArtMarybelle KillingsD.   On: 12/06/2016 16:36    ASSESSMENT & PLAN:   [facility] of [facility] Status post excisional lymph node biopsy- positive for carcinoma- question breast primary [based on immunohistochemistry & Cancer type ID]; status post cycle 4 of carbo-taxol- Refractory disease on re-staging CT  scan [left neck]. Currently on Adriamycin-Cytoxan s/p cycle #1 appx 10 days ago.  # No Significant clinical response noted this time. Discussed that would recommend at least 2-3 cycles of chemotherapy before evaluating scan.  # Leucopenia/ Neutropenia- ANC 300; platelets 58- secondary to chemotherapy. Status post Neulasta. Discussed neutropenic precautions.   # Skeletal metastases- on X-geva; every 6 weeks. We'll be due again with next cycle of chemotherapy.   # Mild erythema around the port site- dermatitis; clinically less likely cellulitis- given a prescription of Keflex not to use unless the erythema gets worse.  # I reviewed the molecular testing available from [facility]; no obvious actionable mutations. Husband interested in looking at other options like [facility]; [facility] [facility] or [employer].   # Patient follow-up with me February 12 chemotherapy labs tumor markers and Xgeva; onpro.     Cammie Sickle, MD 12/28/2016 11:55 AM

## 2016-12-28 NOTE — Assessment & Plan Note (Signed)
#   Status post excisional lymph node biopsy- positive for carcinoma- question breast primary [based on immunohistochemistry & Cancer type ID]; status post cycle 4 of carbo-taxol- Refractory disease on re-staging CT scan [left neck]. Currently on Adriamycin-Cytoxan s/p cycle #1 appx 10 days ago.  # No Significant clinical response noted this time. Discussed that would recommend at least 2-3 cycles of chemotherapy before evaluating scan.  # Leucopenia/ Neutropenia- ANC 300; platelets 58- secondary to chemotherapy. Status post Neulasta. Discussed neutropenic precautions.   # Skeletal metastases- on X-geva; every 6 weeks. We'll be due again with next cycle of chemotherapy.   # Mild erythema around the port site- dermatitis; clinically less likely cellulitis- given a prescription of Keflex not to use unless the erythema gets worse.  # I reviewed the molecular testing available from [place]; no obvious actionable mutations. Husband interested in looking at other options like [facility]; [employer] or [employer].   # Patient follow-up with me February 12 chemotherapy labs tumor markers and Xgeva; onpro.

## 2016-12-28 NOTE — Progress Notes (Signed)
Call report from [facility] - in cancer ctr lab.  anc - 0.3.  / wbc 0.7 critical value read back with lab tech. 1115. md informed of critical anc - Read back process performed with Dr. Rogue Bussing 1115 am

## 2016-12-28 NOTE — Telephone Encounter (Signed)
CRITICAL LAB called in by Lonnie:   ANC = 0.3  WBC = 0.7   MD notified.

## 2017-01-08 DIAGNOSIS — M7752 Other enthesopathy of left foot: Secondary | ICD-10-CM | POA: Diagnosis not present

## 2017-01-08 DIAGNOSIS — L600 Ingrowing nail: Secondary | ICD-10-CM | POA: Diagnosis not present

## 2017-01-10 ENCOUNTER — Inpatient Hospital Stay: Payer: 59

## 2017-01-10 ENCOUNTER — Inpatient Hospital Stay (HOSPITAL_BASED_OUTPATIENT_CLINIC_OR_DEPARTMENT_OTHER): Payer: 59 | Admitting: Internal Medicine

## 2017-01-10 VITALS — BP 124/78 | HR 75 | Temp 97.8°F | Resp 18 | Ht 65.0 in | Wt 124.5 lb

## 2017-01-10 DIAGNOSIS — C778 Secondary and unspecified malignant neoplasm of lymph nodes of multiple regions: Secondary | ICD-10-CM | POA: Diagnosis not present

## 2017-01-10 DIAGNOSIS — Z7689 Persons encountering health services in other specified circumstances: Secondary | ICD-10-CM

## 2017-01-10 DIAGNOSIS — K5900 Constipation, unspecified: Secondary | ICD-10-CM | POA: Diagnosis not present

## 2017-01-10 DIAGNOSIS — K219 Gastro-esophageal reflux disease without esophagitis: Secondary | ICD-10-CM

## 2017-01-10 DIAGNOSIS — L539 Erythematous condition, unspecified: Secondary | ICD-10-CM

## 2017-01-10 DIAGNOSIS — C7951 Secondary malignant neoplasm of bone: Secondary | ICD-10-CM

## 2017-01-10 DIAGNOSIS — R110 Nausea: Secondary | ICD-10-CM

## 2017-01-10 DIAGNOSIS — C801 Malignant (primary) neoplasm, unspecified: Secondary | ICD-10-CM | POA: Diagnosis not present

## 2017-01-10 DIAGNOSIS — T451X5A Adverse effect of antineoplastic and immunosuppressive drugs, initial encounter: Principal | ICD-10-CM

## 2017-01-10 DIAGNOSIS — D72819 Decreased white blood cell count, unspecified: Secondary | ICD-10-CM | POA: Diagnosis not present

## 2017-01-10 DIAGNOSIS — E876 Hypokalemia: Secondary | ICD-10-CM | POA: Diagnosis not present

## 2017-01-10 DIAGNOSIS — Z79899 Other long term (current) drug therapy: Secondary | ICD-10-CM

## 2017-01-10 DIAGNOSIS — Z5111 Encounter for antineoplastic chemotherapy: Secondary | ICD-10-CM | POA: Diagnosis not present

## 2017-01-10 LAB — CBC WITH DIFFERENTIAL/PLATELET
BASOS PCT: 1 %
Basophils Absolute: 0 10*3/uL (ref 0–0.1)
EOS ABS: 0 10*3/uL (ref 0–0.7)
Eosinophils Relative: 0 %
HCT: 31.9 % — ABNORMAL LOW (ref 35.0–47.0)
Hemoglobin: 11.2 g/dL — ABNORMAL LOW (ref 12.0–16.0)
Lymphocytes Relative: 14 %
Lymphs Abs: 0.4 10*3/uL — ABNORMAL LOW (ref 1.0–3.6)
MCH: 32.8 pg (ref 26.0–34.0)
MCHC: 35 g/dL (ref 32.0–36.0)
MCV: 93.7 fL (ref 80.0–100.0)
MONO ABS: 0.5 10*3/uL (ref 0.2–0.9)
MONOS PCT: 16 %
Neutro Abs: 2.1 10*3/uL (ref 1.4–6.5)
Neutrophils Relative %: 69 %
Platelets: 291 10*3/uL (ref 150–440)
RBC: 3.4 MIL/uL — ABNORMAL LOW (ref 3.80–5.20)
RDW: 14.5 % (ref 11.5–14.5)
WBC: 3.1 10*3/uL — ABNORMAL LOW (ref 3.6–11.0)

## 2017-01-10 LAB — COMPREHENSIVE METABOLIC PANEL
ALBUMIN: 4 g/dL (ref 3.5–5.0)
ALK PHOS: 100 U/L (ref 38–126)
ALT: 24 U/L (ref 14–54)
ANION GAP: 6 (ref 5–15)
AST: 35 U/L (ref 15–41)
BILIRUBIN TOTAL: 0.3 mg/dL (ref 0.3–1.2)
BUN: 9 mg/dL (ref 6–20)
CALCIUM: 8.9 mg/dL (ref 8.9–10.3)
CO2: 29 mmol/L (ref 22–32)
Chloride: 105 mmol/L (ref 101–111)
Creatinine, Ser: 0.31 mg/dL — ABNORMAL LOW (ref 0.44–1.00)
GFR calc non Af Amer: >60 mL/min (ref >60)
GLUCOSE: 113 mg/dL — AB (ref 65–99)
POTASSIUM: 3.7 mmol/L (ref 3.5–5.1)
SODIUM: 140 mmol/L (ref 135–145)
TOTAL PROTEIN: 6.9 g/dL (ref 6.5–8.1)

## 2017-01-10 MED ORDER — PEGFILGRASTIM 6 MG/0.6ML ~~LOC~~ PSKT
6.0000 mg | PREFILLED_SYRINGE | Freq: Once | SUBCUTANEOUS | Status: AC
  Administered 2017-01-10: 6 mg via SUBCUTANEOUS
  Filled 2017-01-10: qty 0.6

## 2017-01-10 MED ORDER — SODIUM CHLORIDE 0.9 % IV SOLN
Freq: Once | INTRAVENOUS | Status: AC
  Administered 2017-01-10: 11:00:00 via INTRAVENOUS
  Filled 2017-01-10: qty 1000

## 2017-01-10 MED ORDER — HEPARIN SOD (PORK) LOCK FLUSH 100 UNIT/ML IV SOLN
500.0000 [IU] | Freq: Once | INTRAVENOUS | Status: AC | PRN
  Administered 2017-01-10: 500 [IU]
  Filled 2017-01-10: qty 5

## 2017-01-10 MED ORDER — PALONOSETRON HCL INJECTION 0.25 MG/5ML
0.2500 mg | Freq: Once | INTRAVENOUS | Status: AC
  Administered 2017-01-10: 0.25 mg via INTRAVENOUS
  Filled 2017-01-10: qty 5

## 2017-01-10 MED ORDER — SODIUM CHLORIDE 0.9 % IV SOLN
1000.0000 mg | Freq: Once | INTRAVENOUS | Status: AC
  Administered 2017-01-10: 1000 mg via INTRAVENOUS
  Filled 2017-01-10: qty 50

## 2017-01-10 MED ORDER — DOXORUBICIN HCL CHEMO IV INJECTION 2 MG/ML
60.0000 mg/m2 | Freq: Once | INTRAVENOUS | Status: AC
  Administered 2017-01-10: 96 mg via INTRAVENOUS
  Filled 2017-01-10: qty 48

## 2017-01-10 MED ORDER — DENOSUMAB 120 MG/1.7ML ~~LOC~~ SOLN
120.0000 mg | Freq: Once | SUBCUTANEOUS | Status: AC
  Administered 2017-01-10: 120 mg via SUBCUTANEOUS
  Filled 2017-01-10: qty 1.7

## 2017-01-10 MED ORDER — SODIUM CHLORIDE 0.9 % IV SOLN
Freq: Once | INTRAVENOUS | Status: AC
  Administered 2017-01-10: 12:00:00 via INTRAVENOUS
  Filled 2017-01-10: qty 5

## 2017-01-10 MED ORDER — SODIUM CHLORIDE 0.9% FLUSH
10.0000 mL | INTRAVENOUS | Status: DC | PRN
  Administered 2017-01-10: 10 mL
  Filled 2017-01-10: qty 10

## 2017-01-10 NOTE — Progress Notes (Signed)
[place] NOTE  Patient Care Team: Rusty Aus, MD as PCP - General (Internal Medicine)  CHIEF COMPLAINTS/PURPOSE OF CONSULTATION:   Oncology History   # SEP 2017- LYMPHADENOPATHY- extensive- abdominal/RP/ bil neck LN R>L. 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   # Bone mets- X-geva q 6 W  # MOLECULAR TESTING- ER/PR/her 2 NEG; MSI-STABLE. OCT 2017- MRI Bil Breast NEG. PDL-1- NEG [0%]- Found One [NOV 2017]- PTEN **[others]  # Lung RUL nodule [benign s/p resection; 2009- Dr.Oaks]  # spring 2017- colonoscopy [Dr.Elliot; polyps]; June mammo-NEG     Primary cancer of unknown site [facility])   08/25/2016 Initial Diagnosis    Primary cancer of unknown site [facility])       Oncology History   # SEP 2017- LYMPHADENOPATHY- extensive- abdominal/RP/ bil neck LN R>L. 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   # Bone mets- X-geva q 6 W  # MOLECULAR TESTING- ER/PR/her 2 NEG; MSI-STABLE. OCT 2017- MRI Bil Breast NEG. PDL-1- NEG [0%]- Found One [NOV 2017]- PTEN **[others]  # Lung RUL nodule [benign s/p resection; 2009- Dr.Oaks]  # spring 2017- colonoscopy [Dr.Elliot; polyps]; June mammo-NEG     Primary cancer of unknown site [facility])   08/25/2016 Initial Diagnosis    Primary cancer of unknown site [facility])       HISTORY OF PRESENTING ILLNESS:  Sylvia Jones 61 y.o.  female with a history of carcinoma of unknown primary [possible breast cancer based on IHC] Chemotherapy with Adriamycin Cytoxan every 3 weeks status post cycle #1 approximately 3  weeks ago is here for follow-up.  She had episode of pain in her mid back- need to take a Norco and the muscle relaxant. That then resolved. Currently no worsening pain. She had mild epigastric discomfort needing to take PPI. No weight loss. Appetite good. She noted to have some improvement of the left-sided neck lymphadenopathy.  Denies any headaches. She denies any pain. Otherwise no cough. Mild fatigue after chemotherapy. She is walking herself. No chest pain or shortness of breath.   ROS: A complete 10 point review of system is done which is negative except mentioned above in history of present illness  MEDICAL HISTORY:  Past Medical History:  Diagnosis Date  . Arthritis   . Cancer ([place])    lymph nodes, in process of diagnosing  . Cervical disc disease    upper cervical, denied numbness / tingling BUE  . Cervical stenosis of spine   . Fracture 04/2016   left pubis bone-atypical   . Lymphadenopathy 09/05/2016   R clavical region  . Neuromuscular disorder ([place])    compressed disc in neck when 77 or 61 years old  . Post-menopausal 2011  . Primary cancer of unknown site [facility])   . Tubular adenoma     SURGICAL HISTORY: Past Surgical History:  Procedure Laterality Date  . APPENDECTOMY  1986  . BREAST BIOPSY Right    at age 48  . CESAREAN SECTION  1988  . CESAREAN SECTION  1988  . COLONOSCOPY WITH PROPOFOL N/A 07/12/2015   Procedure: COLONOSCOPY WITH PROPOFOL;  Surgeon: Manya Silvas, MD;  Location: [facility] ENDOSCOPY;  Service: Endoscopy;  Laterality: N/A;  . EXPLORATORY LAPAROTOMY  1986  . LYMPH GLAND EXCISION    . MASS BIOPSY N/A 09/05/2016   Procedure: NECK MASS BIOPSY;  Surgeon: Beverly Gust, MD;  Location: [facility] ORS;  Service: ENT;  Laterality: N/A;  . MASS EXCISION  1986   to remove mass outside the uterus. procedure was combined tacked uterus, reposition of fallopian tubes   . PORTACATH PLACEMENT Left 09/20/2016   Procedure: INSERTION PORT-A-CATH;  Surgeon: Olean Ree, MD;  Location: [facility] ORS;  Service: General;  Laterality: Left;  . THORACOTOMY Right 03/2008   RLL Wedge Resection- Dr. Genevive Bi    SOCIAL HISTORY: lives in [place]; NO smoking/ alcohol- stay home/home schooled son; son-PA in [employer] ER Social History   Social History  . Marital status: Married    Spouse name: N/A  . Number of children: N/A  . Years of education: N/A   Occupational History  . Not on file.   Social History Main Topics  . Smoking status: Never Smoker  . Smokeless tobacco: Never Used  . Alcohol use No  . Drug use: No  . Sexual activity: Yes   Other Topics Concern  . Not on file   Social History Narrative  . No narrative on file    FAMILY HISTORY: father- ? Cancer died 61. .. Brother- kasposi sarcoma Family History  Problem Relation Age of Onset  . Cancer Father     medistinal  carcinoma  . Heart attack Father   . Hypertension Brother   . Diabetes Maternal Grandmother   . Diabetes Paternal Grandmother   . Cancer Brother     Capa C sarcoma    ALLERGIES:  is allergic to sulfa antibiotics.  MEDICATIONS:  Current Outpatient Prescriptions  Medication Sig Dispense Refill  . Calcium Carb-Cholecalciferol (CALCIUM 600+D3 PO) Take 1 Dose by mouth daily.     . cyclobenzaprine (FLEXERIL) 10 MG tablet Take 10 mg by mouth 3 (three) times daily as needed for muscle spasms.    Marland Kitchen HYDROcodone-acetaminophen (NORCO/VICODIN) 5-325 MG tablet Take 1 tablet by mouth every 6 (six) hours as needed for moderate pain.    Marland Kitchen ondansetron (ZOFRAN) 8 MG tablet Take 8 mg by mouth every 8 (eight) hours as needed for nausea or vomiting.    . polyethylene glycol (MIRALAX / GLYCOLAX) packet Take 17 g by mouth daily.    . Sennosides (SENNA LAX PO) Take 1 Dose by mouth daily as needed.     . lactulose (CEPHULAC) 10 g packet Take 10 grams three times daily for the next 2 days. Then 10 grams daily, can increase to 10 grams three times a day for future concerns for worsening constipation  (Patient not taking: Reported on 01/10/2017) 30 each 0  . prochlorperazine (COMPAZINE) 10 MG tablet Take 10 mg by mouth every 6 (six) hours as needed for nausea or vomiting.     No current facility-administered medications for this visit.    Facility-Administered Medications Ordered in Other Visits  Medication Dose Route Frequency Provider Last Rate Last Dose  . sodium chloride flush (NS) 0.9 % injection 10 mL  10 mL Intracatheter PRN Cammie Sickle, MD   10 mL at 01/10/17 0934      .  PHYSICAL EXAMINATION: ECOG PERFORMANCE STATUS: 1 - Symptomatic but completely ambulatory  Vitals:   01/10/17 1013  BP: 124/78  Pulse: 75  Resp: 18  Temp: 97.8 F (36.6  C)   Filed Weights   01/10/17 1013  Weight: 124 lb 8 oz (56.5 kg)    GENERAL: Well-nourished well-developed; Alert, no distress and comfortable.   With husband. Walking herself. EYES: no pallor or icterus OROPHARYNX: no thrush or ulceration; good dentition  NECK: supple, no masses felt LYMPH:  Matted lymphadenopathy noted in the left side of the neck [Slight improvement ]   LUNGS: clear to auscultation and  No wheeze or crackles HEART/CVS: regular rate & rhythm and no murmurs; No lower extremity edema. ABDOMEN: abdomen soft, non-tender and normal bowel sounds Musculoskeletal:no cyanosis of digits and no clubbing  PSYCH: alert & oriented x 3 with fluent speech NEURO: no focal motor/sensory deficits SKIN:  no rashes or significant lesions; mild erythema noted around the  neck    LABORATORY DATA:  I have reviewed the data as listed Lab Results  Component Value Date   WBC 3.1 (L) 01/10/2017   HGB 11.2 (L) 01/10/2017   HCT 31.9 (L) 01/10/2017   MCV 93.7 01/10/2017   PLT 291 01/10/2017    Recent Labs  12/06/16 0821 12/13/16 1015 12/28/16 1037 01/10/17 0934  NA 138 139 139 140  K 3.6 3.8 3.7 3.7  CL 106 105 103 105  CO2 _0 GLUCOSE 115* 114* 81 113*  BUN _1 CREATININE 0.31* 0.46 0.40* 0.31*   CALCIUM 8.9 9.4 9.2 8.9  GFRNONAA >60 >60 >60 >60  GFRAA >60 >60 >60 >60  PROT 7.2 7.2  --  6.9  ALBUMIN 3.9 4.3  --  4.0  AST 33 38  --  35  ALT 25 28  --  24  ALKPHOS 112 104  --  100  BILITOT 0.7 0.6  --  0.3    RADIOGRAPHIC STUDIES: I have personally reviewed the radiological images as listed and agreed with the findings in the report. Nm Cardiac Muga Rest  Result Date: 12/12/2016 CLINICAL DATA:  Encounter for monitoring cardiotoxic drug therapy EXAM: NUCLEAR MEDICINE CARDIAC BLOOD POOL IMAGING (MUGA) TECHNIQUE: Cardiac multi-gated acquisition was performed at rest following intravenous injection of Tc-84mlabeled red blood cells. RADIOPHARMACEUTICALS:  21.6 mCi Tc-976mDP in-vitro labeled red blood cells IV COMPARISON:  07/16/2013 FINDINGS: The left ventricular ejection fraction is equal to 71.1%. Previously 70%. There is normal wall motion. IMPRESSION: 1. Normal left ventricular ejection fraction equal to 71.1%. Electronically Signed   By: TaKerby Moors.D.   On: 12/12/2016 15:59    ASSESSMENT & PLAN:   [facility] Status post excisional lymph node biopsy- positive for carcinoma- question breast primary [based on immunohistochemistry & Cancer type ID]; status post cycle 4 of carbo-taxol- Refractory disease on re-staging CT scan [left neck]. Currently on Adriamycin-Cytoxan s/p cycle #1 appx  3 weeks ago.   # Proceed with cycle #2. Labs reviewed- okay for chemo. Response noted.   # GERD- recommend prilosec once a day.  # back pain ? Etiology- prn pain meds; if worse to let usKoreanow.   #  Bone mets- proceed with X-geva today.    # follow up in 10 days/ labs; follow up in 3 weeks/chemo/ labs/on pro.      GoCammie SickleMD 01/10/2017 4:08 PM

## 2017-01-10 NOTE — Progress Notes (Signed)
Patient here for follow-up with Dr. Rogue Bussing.  She reports intermittent episodes of constipation - relieved with laxatives. Patient reports an episode of nausea and epigastric pain/gerd this weekend. Took antiemetic. epiGastric pain radiated to her back and caused back spams. Pt took a flexiril and norco for symptom mgmt and symptoms relieved.

## 2017-01-10 NOTE — Assessment & Plan Note (Signed)
#   Status post excisional lymph node biopsy- positive for carcinoma- question breast primary [based on immunohistochemistry & Cancer type ID]; status post cycle 4 of carbo-taxol- Refractory disease on re-staging CT scan [left neck]. Currently on Adriamycin-Cytoxan s/p cycle #1 appx  3 weeks ago.   # Proceed with cycle #2. Labs reviewed- okay for chemo. Response noted.   # GERD- recommend prilosec once a day.  # back pain ? Etiology- prn pain meds; if worse to let us know.   #  Bone mets- proceed with X-geva today.    # follow up in 10 days/ labs; follow up in 3 weeks/chemo/ labs/on pro.

## 2017-01-11 LAB — CANCER ANTIGEN 27.29: CA 27.29: 95.5 U/mL — AB (ref 0.0–38.6)

## 2017-01-22 ENCOUNTER — Inpatient Hospital Stay: Payer: 59

## 2017-01-22 ENCOUNTER — Inpatient Hospital Stay (HOSPITAL_BASED_OUTPATIENT_CLINIC_OR_DEPARTMENT_OTHER): Payer: 59 | Admitting: Internal Medicine

## 2017-01-22 VITALS — BP 99/64 | HR 67 | Temp 96.4°F | Wt 120.0 lb

## 2017-01-22 DIAGNOSIS — C801 Malignant (primary) neoplasm, unspecified: Secondary | ICD-10-CM

## 2017-01-22 DIAGNOSIS — C7951 Secondary malignant neoplasm of bone: Secondary | ICD-10-CM

## 2017-01-22 DIAGNOSIS — D72819 Decreased white blood cell count, unspecified: Secondary | ICD-10-CM

## 2017-01-22 DIAGNOSIS — E876 Hypokalemia: Secondary | ICD-10-CM | POA: Diagnosis not present

## 2017-01-22 DIAGNOSIS — K5900 Constipation, unspecified: Secondary | ICD-10-CM | POA: Diagnosis not present

## 2017-01-22 DIAGNOSIS — K219 Gastro-esophageal reflux disease without esophagitis: Secondary | ICD-10-CM

## 2017-01-22 DIAGNOSIS — L539 Erythematous condition, unspecified: Secondary | ICD-10-CM

## 2017-01-22 DIAGNOSIS — Z79899 Other long term (current) drug therapy: Secondary | ICD-10-CM

## 2017-01-22 DIAGNOSIS — Z5111 Encounter for antineoplastic chemotherapy: Secondary | ICD-10-CM | POA: Diagnosis not present

## 2017-01-22 DIAGNOSIS — C778 Secondary and unspecified malignant neoplasm of lymph nodes of multiple regions: Secondary | ICD-10-CM

## 2017-01-22 LAB — CBC WITH DIFFERENTIAL/PLATELET
Basophils Absolute: 0 10*3/uL (ref 0–0.1)
Basophils Relative: 1 %
Eosinophils Absolute: 0 10*3/uL (ref 0–0.7)
Eosinophils Relative: 1 %
HEMATOCRIT: 31.5 % — AB (ref 35.0–47.0)
Hemoglobin: 11 g/dL — ABNORMAL LOW (ref 12.0–16.0)
Lymphocytes Relative: 15 %
Lymphs Abs: 0.4 10*3/uL — ABNORMAL LOW (ref 1.0–3.6)
MCH: 32.3 pg (ref 26.0–34.0)
MCHC: 34.9 g/dL (ref 32.0–36.0)
MCV: 92.6 fL (ref 80.0–100.0)
MONO ABS: 0.5 10*3/uL (ref 0.2–0.9)
MONOS PCT: 17 %
NEUTROS ABS: 1.9 10*3/uL (ref 1.4–6.5)
Neutrophils Relative %: 66 %
Platelets: 108 10*3/uL — ABNORMAL LOW (ref 150–440)
RBC: 3.4 MIL/uL — ABNORMAL LOW (ref 3.80–5.20)
RDW: 13.8 % (ref 11.5–14.5)
WBC: 2.9 10*3/uL — ABNORMAL LOW (ref 3.6–11.0)

## 2017-01-22 LAB — BASIC METABOLIC PANEL
Anion gap: 5 (ref 5–15)
BUN: 6 mg/dL (ref 6–20)
CHLORIDE: 103 mmol/L (ref 101–111)
CO2: 32 mmol/L (ref 22–32)
Calcium: 9.3 mg/dL (ref 8.9–10.3)
Creatinine, Ser: 0.4 mg/dL — ABNORMAL LOW (ref 0.44–1.00)
GFR calc non Af Amer: >60 mL/min (ref >60)
Glucose, Bld: 91 mg/dL (ref 65–99)
POTASSIUM: 3 mmol/L — AB (ref 3.5–5.1)
SODIUM: 140 mmol/L (ref 135–145)

## 2017-01-22 MED ORDER — POTASSIUM CHLORIDE CRYS ER 20 MEQ PO TBCR: EXTENDED_RELEASE_TABLET | ORAL | 3 refills | Status: DC

## 2017-01-22 NOTE — Assessment & Plan Note (Addendum)
#   Status post excisional lymph node biopsy- positive for carcinoma- question breast primary [based on immunohistochemistry & Cancer type ID]; status post cycle 4 of carbo-taxol- Refractory disease on re-staging CT scan [left neck]. Currently on Adriamycin-Cytoxan s/p cycle #2 appx  10 days ago.   # Proceed with cycle #2. Labs reviewed. Response noted after 2 cycles. We will plan to get CT neck, chest abdomen pelvis prior to next cycle.  # GERD/ improved s/p TUMS.   # back pain- improved.  if worse to let us know.   #  Bone mets- on x-geva.  # Hypokalemia- 3.0/ recommend potassium supplementation; one week. Prescription given.  # follow up in appx 10 days /chemo/ labs/on pro. CT scan prior.

## 2017-01-22 NOTE — Progress Notes (Signed)
Patient here today for follow up.  Patientt states no new concerns today

## 2017-01-22 NOTE — Progress Notes (Signed)
[place] NOTE  Patient Care Team: Rusty Aus, MD as PCP - General (Internal Medicine)  CHIEF COMPLAINTS/PURPOSE OF CONSULTATION:   Oncology History   # SEP 2017- LYMPHADENOPATHY- extensive- abdominal/RP/ bil neck LN R>L. 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   # Bone mets- X-geva q 6 W  # MOLECULAR TESTING- ER/PR/her 2 NEG; MSI-STABLE. OCT 2017- MRI Bil Breast NEG. PDL-1- NEG [0%]- Found One [NOV 2017]- PTEN **[others]  # Lung RUL nodule [benign s/p resection; 2009- Dr.Oaks]  # spring 2017- colonoscopy [Dr.Elliot; polyps]; June mammo-NEG     Primary cancer of unknown site [facility])   08/25/2016 Initial Diagnosis    Primary cancer of unknown site [facility])       Oncology History   # SEP 2017- LYMPHADENOPATHY- extensive- abdominal/RP/ bil neck LN R>L. 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   # Bone mets- X-geva q 6 W  # MOLECULAR TESTING- ER/PR/her 2 NEG; MSI-STABLE. OCT 2017- MRI Bil Breast NEG. PDL-1- NEG [0%]- Found One [NOV 2017]- PTEN **[others]  # Lung RUL nodule [benign s/p resection; 2009- Dr.Oaks]  # spring 2017- colonoscopy [Dr.Elliot; polyps]; June mammo-NEG     Primary cancer of unknown site [facility])   08/25/2016 Initial Diagnosis    Primary cancer of unknown site [facility])       HISTORY OF PRESENTING ILLNESS:  Sylvia Jones 61 y.o.  female with a history of carcinoma of unknown primary [possible breast cancer based on IHC] Chemotherapy with Adriamycin Cytoxan every 3 weeks status post cycle #2 approximately 10  weeks ago is here for follow-up.  Denies any pain. Abdominal discomfort improved after starting Tums. No weight loss. Appetite good. She noted to have some improvement of the left-sided neck lymphadenopathy. Otherwise no cough. Mild fatigue after chemotherapy. She is walking herself. No chest pain or shortness of breath. No pain.  ROS: A complete 10 point review of system is done which is negative except mentioned above in history of present illness  MEDICAL HISTORY:  Past Medical History:  Diagnosis Date  . Arthritis   . Cancer ([place])    lymph nodes, in process of diagnosing  . Cervical disc disease    upper cervical, denied numbness / tingling BUE  . Cervical stenosis of spine   . Fracture 04/2016   left pubis bone-atypical   . Lymphadenopathy 09/05/2016   R clavical region  . Neuromuscular disorder ([place])    compressed disc in neck when 49 or 61 years old  . Post-menopausal 2011  . Primary cancer of unknown site [facility])   . Tubular adenoma     SURGICAL HISTORY: Past Surgical History:  Procedure Laterality Date  . APPENDECTOMY  1986  . BREAST BIOPSY Right    at age 35  . CESAREAN SECTION  1988  . CESAREAN SECTION  1988  . COLONOSCOPY WITH PROPOFOL N/A 07/12/2015   Procedure: COLONOSCOPY WITH PROPOFOL;  Surgeon: Manya Silvas, MD;  Location: [facility] ENDOSCOPY;  Service: Endoscopy;  Laterality: N/A;  . EXPLORATORY LAPAROTOMY  1986  . LYMPH GLAND EXCISION    . MASS  BIOPSY N/A 09/05/2016   Procedure: NECK MASS BIOPSY;  Surgeon: Beverly Gust, MD;  Location: [facility] ORS;  Service: ENT;  Laterality: N/A;  . MASS EXCISION  1986   to remove mass outside the uterus. procedure was combined tacked uterus, reposition of fallopian tubes   . PORTACATH PLACEMENT Left 09/20/2016   Procedure: INSERTION PORT-A-CATH;  Surgeon: Olean Ree, MD;  Location: [facility] ORS;  Service: General;  Laterality: Left;  . THORACOTOMY Right 03/2008   RLL Wedge Resection- Dr. Genevive Bi    SOCIAL HISTORY: lives in  [place]; NO smoking/ alcohol- stay home/home schooled son; son-PA in [employer] ER Social History   Social History  . Marital status: Married    Spouse name: N/A  . Number of children: N/A  . Years of education: N/A   Occupational History  . Not on file.   Social History Main Topics  . Smoking status: Never Smoker  . Smokeless tobacco: Never Used  . Alcohol use No  . Drug use: No  . Sexual activity: Yes   Other Topics Concern  . Not on file   Social History Narrative  . No narrative on file    FAMILY HISTORY: father- ? Cancer died 103. .. Brother- kasposi sarcoma Family History  Problem Relation Age of Onset  . Cancer Father     medistinal  carcinoma  . Heart attack Father   . Hypertension Brother   . Diabetes Maternal Grandmother   . Diabetes Paternal Grandmother   . Cancer Brother     Capa C sarcoma    ALLERGIES:  is allergic to sulfa antibiotics.  MEDICATIONS:  Current Outpatient Prescriptions  Medication Sig Dispense Refill  . Calcium Carb-Cholecalciferol (CALCIUM 600+D3 PO) Take 1 Dose by mouth daily.     . ondansetron (ZOFRAN) 8 MG tablet Take 8 mg by mouth every 8 (eight) hours as needed for nausea or vomiting.    . polyethylene glycol (MIRALAX / GLYCOLAX) packet Take 17 g by mouth daily.    . prochlorperazine (COMPAZINE) 10 MG tablet Take 10 mg by mouth every 6 (six) hours as needed for nausea or vomiting.    . Sennosides (SENNA LAX PO) Take 1 Dose by mouth daily as needed.     . cyclobenzaprine (FLEXERIL) 10 MG tablet Take 10 mg by mouth 3 (three) times daily as needed for muscle spasms.    Marland Kitchen HYDROcodone-acetaminophen (NORCO/VICODIN) 5-325 MG tablet Take 1 tablet by mouth every 6 (six) hours as needed for moderate pain.    Marland Kitchen lactulose (CEPHULAC) 10 g packet Take 10 grams three times daily for the next 2 days. Then 10 grams daily, can increase to 10 grams three times a day for future concerns for worsening constipation (Patient not taking: Reported on 01/10/2017)  30 each 0  . potassium chloride SA (K-DUR,KLOR-CON) 20 MEQ tablet 1 pill twice a day x1 week. 30 tablet 3   No current facility-administered medications for this visit.       Marland Kitchen  PHYSICAL EXAMINATION: ECOG PERFORMANCE STATUS: 1 - Symptomatic but completely ambulatory  Vitals:   01/22/17 1102  BP: 99/64  Pulse: 67  Temp: (!) 96.4 F (35.8 C)   Filed Weights   01/22/17 1102  Weight: 120 lb (54.4 kg)    GENERAL: Well-nourished well-developed; Alert, no distress and comfortable.   With husband. Walking herself. EYES: no pallor or icterus OROPHARYNX: no thrush or ulceration; good dentition  NECK: supple, no masses felt LYMPH:  Matted lymphadenopathy noted in the left side  of the neck [Slight improvement ]   LUNGS: clear to auscultation and  No wheeze or crackles HEART/CVS: regular rate & rhythm and no murmurs; No lower extremity edema. ABDOMEN: abdomen soft, non-tender and normal bowel sounds Musculoskeletal:no cyanosis of digits and no clubbing  PSYCH: alert & oriented x 3 with fluent speech NEURO: no focal motor/sensory deficits SKIN:  no rashes or significant lesions.    LABORATORY DATA:  I have reviewed the data as listed Lab Results  Component Value Date   WBC 2.9 (L) 01/22/2017   HGB 11.0 (L) 01/22/2017   HCT 31.5 (L) 01/22/2017   MCV 92.6 01/22/2017   PLT 108 (L) 01/22/2017    Recent Labs  12/06/16 0821 12/13/16 1015 12/28/16 1037 01/10/17 0934 01/22/17 1030  NA 138 139 139 140 140  K 3.6 3.8 3.7 3.7 3.0*  CL 106 105 103 105 103  CO2 26 28 30 29  32  GLUCOSE 115* 114* 81 113* 91  BUN 10 9 11 9 6   CREATININE 0.31* 0.46 0.40* 0.31* 0.40*  CALCIUM 8.9 9.4 9.2 8.9 9.3  GFRNONAA >60 >60 >60 >60 >60  GFRAA >60 >60 >60 >60 >60  PROT 7.2 7.2  --  6.9  --   ALBUMIN 3.9 4.3  --  4.0  --   AST 33 38  --  35  --   ALT 25 28  --  24  --   ALKPHOS 112 104  --  100  --   BILITOT 0.7 0.6  --  0.3  --     RADIOGRAPHIC STUDIES: I have personally reviewed the  radiological images as listed and agreed with the findings in the report. No results found.  ASSESSMENT & PLAN:   Primary cancer of unknown site [facility]) # Status post excisional lymph node biopsy- positive for carcinoma- question breast primary [based on immunohistochemistry & Cancer type ID]; status post cycle 4 of carbo-taxol- Refractory disease on re-staging CT scan [left neck]. Currently on Adriamycin-Cytoxan s/p cycle #2 appx  10 days ago.   # Proceed with cycle #2. Labs reviewed. Response noted after 2 cycles. We will plan to get CT neck, chest abdomen pelvis prior to next cycle.  # GERD/ improved s/p TUMS.   # back pain- improved.  if worse to let us know.   #  Bone mets- on x-geva.  # Hypokalemia- 3.0/ recommend potassium supplementation; one week. Prescription given.  # follow up in appx 10 days /chemo/ labs/on pro. CT scan prior.     Cammie Sickle, MD 01/22/2017 1:10 PM

## 2017-01-29 ENCOUNTER — Ambulatory Visit
Admission: RE | Admit: 2017-01-29 | Discharge: 2017-01-29 | Disposition: A | Discharge status: Home / Self Care | Payer: 59 | Source: Ambulatory Visit | Attending: Internal Medicine | Admitting: Internal Medicine

## 2017-01-29 DIAGNOSIS — R590 Localized enlarged lymph nodes: Secondary | ICD-10-CM | POA: Insufficient documentation

## 2017-01-29 DIAGNOSIS — R935 Abnormal findings on diagnostic imaging of other abdominal regions, including retroperitoneum: Secondary | ICD-10-CM | POA: Insufficient documentation

## 2017-01-29 DIAGNOSIS — C801 Malignant (primary) neoplasm, unspecified: Secondary | ICD-10-CM | POA: Diagnosis not present

## 2017-01-29 DIAGNOSIS — R599 Enlarged lymph nodes, unspecified: Secondary | ICD-10-CM | POA: Diagnosis not present

## 2017-01-29 DIAGNOSIS — K8020 Calculus of gallbladder without cholecystitis without obstruction: Secondary | ICD-10-CM | POA: Diagnosis not present

## 2017-01-29 DIAGNOSIS — M8448XD Pathological fracture, other site, subsequent encounter for fracture with routine healing: Secondary | ICD-10-CM | POA: Insufficient documentation

## 2017-01-29 DIAGNOSIS — C7951 Secondary malignant neoplasm of bone: Secondary | ICD-10-CM | POA: Insufficient documentation

## 2017-01-29 MED ORDER — IOPAMIDOL (ISOVUE-300) INJECTION 61%
100.0000 mL | Freq: Once | INTRAVENOUS | Status: AC | PRN
  Administered 2017-01-29: 100 mL via INTRAVENOUS

## 2017-01-30 ENCOUNTER — Telehealth: Payer: Self-pay | Admitting: Internal Medicine

## 2017-01-30 NOTE — Telephone Encounter (Signed)
Spoke to patient's husband regarding the CT imaging clinically doing well. Follow-up is planned for tomorrow.

## 2017-01-31 ENCOUNTER — Inpatient Hospital Stay: Payer: 59 | Attending: Internal Medicine | Admitting: Internal Medicine

## 2017-01-31 ENCOUNTER — Inpatient Hospital Stay: Payer: 59

## 2017-01-31 VITALS — BP 130/75 | HR 72 | Temp 97.2°F | Resp 20 | Ht 65.0 in | Wt 121.5 lb

## 2017-01-31 DIAGNOSIS — E876 Hypokalemia: Secondary | ICD-10-CM | POA: Diagnosis not present

## 2017-01-31 DIAGNOSIS — K219 Gastro-esophageal reflux disease without esophagitis: Secondary | ICD-10-CM | POA: Insufficient documentation

## 2017-01-31 DIAGNOSIS — K5900 Constipation, unspecified: Secondary | ICD-10-CM | POA: Diagnosis not present

## 2017-01-31 DIAGNOSIS — C7951 Secondary malignant neoplasm of bone: Secondary | ICD-10-CM | POA: Diagnosis not present

## 2017-01-31 DIAGNOSIS — Z79899 Other long term (current) drug therapy: Secondary | ICD-10-CM | POA: Diagnosis not present

## 2017-01-31 DIAGNOSIS — Z7689 Persons encountering health services in other specified circumstances: Secondary | ICD-10-CM | POA: Insufficient documentation

## 2017-01-31 DIAGNOSIS — C801 Malignant (primary) neoplasm, unspecified: Secondary | ICD-10-CM | POA: Diagnosis not present

## 2017-01-31 DIAGNOSIS — C778 Secondary and unspecified malignant neoplasm of lymph nodes of multiple regions: Secondary | ICD-10-CM | POA: Insufficient documentation

## 2017-01-31 DIAGNOSIS — T451X5A Adverse effect of antineoplastic and immunosuppressive drugs, initial encounter: Principal | ICD-10-CM

## 2017-01-31 DIAGNOSIS — Z5111 Encounter for antineoplastic chemotherapy: Secondary | ICD-10-CM | POA: Insufficient documentation

## 2017-01-31 DIAGNOSIS — R110 Nausea: Secondary | ICD-10-CM

## 2017-01-31 LAB — CBC WITH DIFFERENTIAL/PLATELET
Basophils Absolute: 0.1 10*3/uL (ref 0–0.1)
Basophils Relative: 1 %
EOS PCT: 0 %
Eosinophils Absolute: 0 10*3/uL (ref 0–0.7)
HCT: 31.9 % — ABNORMAL LOW (ref 35.0–47.0)
Hemoglobin: 11.2 g/dL — ABNORMAL LOW (ref 12.0–16.0)
LYMPHS ABS: 0.5 10*3/uL — AB (ref 1.0–3.6)
LYMPHS PCT: 7 %
MCH: 32.7 pg (ref 26.0–34.0)
MCHC: 35.1 g/dL (ref 32.0–36.0)
MCV: 93.1 fL (ref 80.0–100.0)
MONOS PCT: 15 %
Monocytes Absolute: 1 10*3/uL — ABNORMAL HIGH (ref 0.2–0.9)
Neutro Abs: 4.7 10*3/uL (ref 1.4–6.5)
Neutrophils Relative %: 77 %
PLATELETS: 257 10*3/uL (ref 150–440)
RBC: 3.43 MIL/uL — AB (ref 3.80–5.20)
RDW: 15.1 % — ABNORMAL HIGH (ref 11.5–14.5)
WBC: 6.2 10*3/uL (ref 3.6–11.0)

## 2017-01-31 LAB — COMPREHENSIVE METABOLIC PANEL
ALT: 24 U/L (ref 14–54)
AST: 33 U/L (ref 15–41)
Albumin: 4.1 g/dL (ref 3.5–5.0)
Alkaline Phosphatase: 104 U/L (ref 38–126)
Anion gap: 4 — ABNORMAL LOW (ref 5–15)
BUN: 10 mg/dL (ref 6–20)
CALCIUM: 9.9 mg/dL (ref 8.9–10.3)
CHLORIDE: 104 mmol/L (ref 101–111)
CO2: 28 mmol/L (ref 22–32)
CREATININE: 0.34 mg/dL — AB (ref 0.44–1.00)
Glucose, Bld: 83 mg/dL (ref 65–99)
Potassium: 4 mmol/L (ref 3.5–5.1)
Sodium: 136 mmol/L (ref 135–145)
Total Bilirubin: 0.3 mg/dL (ref 0.3–1.2)
Total Protein: 7.1 g/dL (ref 6.5–8.1)

## 2017-01-31 MED ORDER — DOXORUBICIN HCL CHEMO IV INJECTION 2 MG/ML
60.0000 mg/m2 | Freq: Once | INTRAVENOUS | Status: AC
  Administered 2017-01-31: 96 mg via INTRAVENOUS
  Filled 2017-01-31: qty 48

## 2017-01-31 MED ORDER — LIDOCAINE-PRILOCAINE 2.5-2.5 % EX CREA: 1.0000 "application " | TOPICAL_CREAM | CUTANEOUS | 0 refills | Status: DC | PRN

## 2017-01-31 MED ORDER — PEGFILGRASTIM 6 MG/0.6ML ~~LOC~~ PSKT
6.0000 mg | PREFILLED_SYRINGE | Freq: Once | SUBCUTANEOUS | Status: AC
  Administered 2017-01-31: 6 mg via SUBCUTANEOUS
  Filled 2017-01-31: qty 0.6

## 2017-01-31 MED ORDER — HEPARIN SOD (PORK) LOCK FLUSH 100 UNIT/ML IV SOLN
500.0000 [IU] | Freq: Once | INTRAVENOUS | Status: AC | PRN
  Administered 2017-01-31: 500 [IU]
  Filled 2017-01-31: qty 5

## 2017-01-31 MED ORDER — PALONOSETRON HCL INJECTION 0.25 MG/5ML
0.2500 mg | Freq: Once | INTRAVENOUS | Status: AC
  Administered 2017-01-31: 0.25 mg via INTRAVENOUS
  Filled 2017-01-31: qty 5

## 2017-01-31 MED ORDER — SODIUM CHLORIDE 0.9% FLUSH
10.0000 mL | INTRAVENOUS | Status: DC | PRN
  Administered 2017-01-31: 10 mL
  Filled 2017-01-31: qty 10

## 2017-01-31 MED ORDER — SODIUM CHLORIDE 0.9 % IV SOLN
Freq: Once | INTRAVENOUS | Status: AC
  Administered 2017-01-31: 11:00:00 via INTRAVENOUS
  Filled 2017-01-31: qty 1000

## 2017-01-31 MED ORDER — FOSAPREPITANT DIMEGLUMINE INJECTION 150 MG
Freq: Once | INTRAVENOUS | Status: AC
  Administered 2017-01-31: 12:00:00 via INTRAVENOUS
  Filled 2017-01-31: qty 5

## 2017-01-31 MED ORDER — SODIUM CHLORIDE 0.9 % IV SOLN
1000.0000 mg | Freq: Once | INTRAVENOUS | Status: AC
  Administered 2017-01-31: 1000 mg via INTRAVENOUS
  Filled 2017-01-31: qty 50

## 2017-01-31 NOTE — Progress Notes (Signed)
[place] NOTE  Patient Care Team: Rusty Aus, MD as PCP - General (Internal Medicine)  CHIEF COMPLAINTS/PURPOSE OF CONSULTATION:   Oncology History   # SEP 2017- LYMPHADENOPATHY- extensive- abdominal/RP/ bil neck LN R>L. 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   # Bone mets- X-geva q 6 W  # MOLECULAR TESTING- ER/PR/her 2 NEG; MSI-STABLE. OCT 2017- MRI Bil Breast NEG. PDL-1- NEG [0%]- Found One [NOV 2017]- PTEN **[others]  # Lung RUL nodule [benign s/p resection; 2009- Dr.Oaks]  # spring 2017- colonoscopy [Dr.Elliot; polyps]; June mammo-NEG     Primary cancer of unknown site [facility])   08/25/2016 Initial Diagnosis    Primary cancer of unknown site [facility])       Oncology History   # SEP 2017- LYMPHADENOPATHY- extensive- abdominal/RP/ bil neck LN R>L. 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   # Bone mets- X-geva q 6 W  # MOLECULAR TESTING- ER/PR/her 2 NEG; MSI-STABLE. OCT 2017- MRI Bil Breast NEG. PDL-1- NEG [0%]- Found One [NOV 2017]- PTEN **[others]  # Lung RUL nodule [benign s/p resection; 2009- Dr.Oaks]  # spring 2017- colonoscopy [Dr.Elliot; polyps]; June mammo-NEG     Primary cancer of unknown site [facility])   08/25/2016 Initial Diagnosis    Primary cancer of unknown site [facility])       HISTORY OF PRESENTING ILLNESS:  Sylvia Jones 61 y.o.  female with a history of carcinoma of unknown primary [possible breast cancer based on IHC] Chemotherapy with Adriamycin Cytoxan every 3 weeks status post cycle #2 approximately 3  weeks ago is here for follow-up/ To review the results of the restaging CAT scan.  Appetite is good. No weight loss. No nausea no vomiting. Denies any worsening swelling in the neck. Denies any diarrhea. No cough or shortness of breath. Complains of fatigue for 4-5 days postchemotherapy. Then improves. Denies any significant pain.   ROS: A complete 10 point review of system is done which is negative except mentioned above in history of present illness  MEDICAL HISTORY:  Past Medical History:  Diagnosis Date  . Arthritis   . Cancer ([place])    lymph nodes, in process of diagnosing  . Cervical disc disease    upper cervical, denied numbness / tingling BUE  . Cervical stenosis of spine   . Fracture 04/2016   left pubis bone-atypical   . Lymphadenopathy 09/05/2016   R clavical region  . Neuromuscular disorder ([place])    compressed disc in neck when 15 or 61 years old  . Post-menopausal 2011  . Primary cancer of unknown site [facility])   . Tubular adenoma     SURGICAL HISTORY: Past Surgical History:  Procedure Laterality Date  . APPENDECTOMY  1986  . BREAST BIOPSY Right    at age 32  . CESAREAN SECTION  1988  . CESAREAN SECTION  1988  . COLONOSCOPY WITH PROPOFOL N/A 07/12/2015   Procedure: COLONOSCOPY WITH PROPOFOL;  Surgeon: Manya Silvas, MD;  Location: [facility] ENDOSCOPY;  Service: Endoscopy;  Laterality: N/A;  . EXPLORATORY LAPAROTOMY  1986  . LYMPH GLAND EXCISION    .  MASS BIOPSY N/A 09/05/2016   Procedure: NECK MASS BIOPSY;  Surgeon: Beverly Gust, MD;  Location: [facility] ORS;  Service: ENT;  Laterality: N/A;  . MASS EXCISION  1986   to remove mass outside the uterus. procedure was combined tacked uterus, reposition of fallopian tubes   . PORTACATH PLACEMENT Left 09/20/2016   Procedure: INSERTION PORT-A-CATH;  Surgeon: Olean Ree, MD;  Location: [facility] ORS;  Service: General;  Laterality: Left;  . THORACOTOMY Right 03/2008   RLL Wedge Resection- Dr. 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 Marital status: Married    Spouse name: N/A  . Number of children: N/A  . Years of education: N/A   Occupational History  . Not on file.   Social History Main Topics  . Smoking status: Never Smoker  . Smokeless tobacco: Never Used  . Alcohol use No  . Drug use: No  . Sexual activity: Yes   Other Topics Concern  . Not on file   Social History Narrative  . No narrative on file    FAMILY HISTORY: father- ? Cancer died 21. .. Brother- kasposi sarcoma Family History  Problem Relation Age of Onset  . Cancer Father     medistinal  carcinoma  . Heart attack Father   . Hypertension Brother   . Diabetes Maternal Grandmother   . Diabetes Paternal Grandmother   . Cancer Brother     Capa C sarcoma    ALLERGIES:  is allergic to sulfa antibiotics.  MEDICATIONS:  Current Outpatient Prescriptions  Medication Sig Dispense Refill  . Calcium Carb-Cholecalciferol (CALCIUM 600+D3 PO) Take 1 Dose by mouth daily.     . polyethylene glycol (MIRALAX / GLYCOLAX) packet Take 17 g by mouth daily.    . potassium chloride SA (K-DUR,KLOR-CON) 20 MEQ tablet 1 pill twice a day x1 week. 30 tablet 3  . Sennosides (SENNA LAX PO) Take 1 Dose by mouth daily as needed.     . cyclobenzaprine (FLEXERIL) 10 MG tablet Take 10 mg by mouth 3 (three) times daily as needed for muscle spasms.    Marland Kitchen HYDROcodone-acetaminophen (NORCO/VICODIN) 5-325 MG tablet Take 1 tablet by mouth every 6 (six) hours as needed for moderate pain.    Marland Kitchen lactulose (CEPHULAC) 10 g packet Take 10 grams three times daily for the next 2 days. Then 10 grams daily, can increase to 10 grams three times a day for future concerns for worsening constipation (Patient not taking: Reported on 01/10/2017) 30 each 0  . lidocaine-prilocaine (EMLA) cream Apply 1 application topically as needed. Apply generously over the Mediport 45 minutes  prior to chemotherapy. 30 g 0  . ondansetron (ZOFRAN) 8 MG tablet Take 8 mg by mouth every 8 (eight) hours as needed for nausea or vomiting.    . prochlorperazine (COMPAZINE) 10 MG tablet Take 10 mg by mouth every 6 (six) hours as needed for nausea or vomiting.     No current facility-administered medications for this visit.    Facility-Administered Medications Ordered in Other Visits  Medication Dose Route Frequency Provider Last Rate Last Dose  . sodium chloride flush (NS) 0.9 % injection 10 mL  10 mL Intracatheter PRN Cammie Sickle, MD   10 mL at 01/31/17 1330      .  PHYSICAL EXAMINATION: ECOG PERFORMANCE STATUS: 1 - Symptomatic but completely ambulatory  Vitals:   01/31/17 1032  BP: 130/75  Pulse: 72  Resp: 20  Temp: 97.2 F (36.2 C)   Filed Weights   01/31/17 1032  Weight: 121 lb 8 oz (55.1 kg)    GENERAL: Well-nourished well-developed; Alert, no distress and comfortable.   With husband. Walking herself. EYES: no pallor or icterus OROPHARYNX: no thrush or ulceration; good dentition  NECK: supple, no masses felt LYMPH:  Matted lymphadenopathy noted in the left side of the neck [Slight improvement ]   LUNGS: clear to auscultation and  No wheeze or crackles HEART/CVS: regular rate & rhythm and no murmurs; No lower extremity edema. ABDOMEN: abdomen soft, non-tender and normal bowel sounds Musculoskeletal:no cyanosis of digits and no clubbing  PSYCH: alert & oriented x 3 with fluent speech NEURO: no focal motor/sensory deficits SKIN:  no rashes or significant lesions.    LABORATORY DATA:  I have reviewed the data as listed Lab Results  Component Value Date   WBC 6.2 01/31/2017   HGB 11.2 (L) 01/31/2017   HCT 31.9 (L) 01/31/2017   MCV 93.1 01/31/2017   PLT 257 01/31/2017    Recent Labs  12/13/16 1015  01/10/17 0934 01/22/17 1030 01/31/17 1006  NA 139  < > 140 140 136  K 3.8  < > 3.7 3.0* 4.0  CL 105  < > 105 103 104  CO2 28  < > 29 32 28   GLUCOSE 114*  < > 113* 91 83  BUN 9  < > 9 6 10   CREATININE 0.46  < > 0.31* 0.40* 0.34*  CALCIUM 9.4  < > 8.9 9.3 9.9  GFRNONAA >60  < > >60 >60 >60  GFRAA >60  < > >60 >60 >60  PROT 7.2  --  6.9  --  7.1  ALBUMIN 4.3  --  4.0  --  4.1  AST 38  --  35  --  33  ALT 28  --  24  --  24  ALKPHOS 104  --  100  --  104  BILITOT 0.6  --  0.3  --  0.3  < > = values in this interval not displayed.  RADIOGRAPHIC STUDIES: I have personally reviewed the radiological images as listed and agreed with the findings in the report. Ct Soft Tissue Neck W Contrast  Result Date: 01/29/2017 CLINICAL DATA:  61 year old female with metastatic lymphadenopathy in the neck abdomen and pelvis, as well as lytic bone metastases. Adenocarcinoma of unknown primary. Restaging. Subsequent encounter. EXAM: CT NECK WITH CONTRAST TECHNIQUE: Multidetector CT imaging of the neck was performed using the standard protocol following the bolus administration of intravenous contrast. CONTRAST:  144m ISOVUE-300 IOPAMIDOL (ISOVUE-300) INJECTION 61% in conjunction with contrast enhanced imaging of the chest, abdomen, and pelvis reported separately. COMPARISON:  Neck CT 11/10/2016 and earlier. FINDINGS: Pharynx and larynx: Larynx and pharynx soft tissue contours remain normal. Negative parapharyngeal and retropharyngeal spaces. Salivary glands: Negative sublingual space. Submandibular glands and parotid glands remain normal. Thyroid: Negative. Lymph nodes: The previously described cluster of subcentimeter left level 2 B lymph nodes in December are stable to mildly regressed (series 3, image 26 today). The previously-seen 7 mm left level IIa lymph node appears larger, now 10 mm short axis (series 3, image 37 today). Compare sagittal image 100 today to sagittal image 84 in December. Left level IIIb lymph node on series 3, image 42 today is regressed. Left level 4 lymph nodes with mild residual surrounding inflammatory stranding are further  regressed. No other lymph  node enlargement identified. No abnormal right side nodes. Stable right level IIa node measuring 5-6 mm on series 3, image 34 today. Vascular: Major vascular structures in the neck and at the skullbase remain patent. No cervical carotid atherosclerosis or stenosis. Limited intracranial: Negative. Visualized orbits: Negative. Mastoids and visualized paranasal sinuses: Visualized paranasal sinuses and mastoids are stable and well pneumatized. Skeleton: Pathologic fracture of the C7 spinous process is more sclerotic, but this seems to be related to interval healing with callus. Heterogeneity in the T2 vertebral body has not significantly changed and there is no pathologic vertebral body fracture. No new osseous lesion identified in the neck. Upper chest: Chest CT today reported separately. IMPRESSION: 1. A single left level IIa lymph node appears larger than in December, now 10 mm short axis (previously 7 mm). Suspicious for focal progression. However, all other bilateral cervical lymph nodes are stable to regressed. 2. Interval healing of the C7 spinous process pathologic fracture. T2 vertebral body metastasis appears stable. 3. Chest abdomen and pelvis CTs today are reported separately. Electronically Signed   By: Genevie Ann M.D.   On: 01/29/2017 13:02   Ct Chest W Contrast  Result Date: 01/29/2017 CLINICAL DATA:  Status post excisional lymph node biopsy in the neck positive for carcinoma, possible breast primary. Restaging post chemotherapy. EXAM: CT CHEST, ABDOMEN, AND PELVIS WITH CONTRAST TECHNIQUE: Multidetector CT imaging of the chest, abdomen and pelvis was performed following the standard protocol during bolus administration of intravenous contrast. CONTRAST:  167m ISOVUE-300 IOPAMIDOL (ISOVUE-300) INJECTION 61% COMPARISON:  Chest CT 12/11/2016. CT of the chest and abdomen 11/10/2016. PET-CT 08/29/2016. FINDINGS: Neck findings are dictated separately. CT CHEST FINDINGS  Cardiovascular: There are no significant vascular findings. Left subclavian Port-A-Cath extends to the mid SVC level. Mediastinum/Nodes: Subcarinal adenopathy has improved, measuring up to 2.6 x 1.8 cm on image 26. Mildly enlarged right hilar lymph nodes measuring up to 11 mm (image 31) and not significantly changed. There is no other mediastinal, axillary or internal mammary lymphadenopathy. The thyroid gland appears unremarkable. Small hiatal hernia noted. Lungs/Pleura: There is no pleural effusion. Stable linear right lower lobe scarring and calcification. There is also mild biapical scarring. No suspicious pulmonary nodules. Musculoskeletal/Chest wall: Stable sclerosis of the left first rib and T2 vertebral body. No chest wall mass, suspicious breast findings or new osseous lesions identified. CT ABDOMEN AND PELVIS FINDINGS Hepatobiliary: The liver is normal in density without focal abnormality. Cholelithiasis, mild gallbladder wall thickening and pericholecystic fluid are noted. There is no significant biliary dilatation. Pancreas: Unremarkable. No pancreatic ductal dilatation or surrounding inflammatory changes. Spleen: Normal in size without focal abnormality. Adrenals/Urinary Tract: Both adrenal glands appear normal. The kidneys appear normal without evidence of urinary tract calculus, suspicious lesion or hydronephrosis. No bladder abnormalities are seen. Stomach/Bowel: The stomach and proximal small bowel appear normal. There is new bowel wall thickening in the right lower quadrant, involving the ileocecal valve and cecum. This most obvious on axial images 98 through 101. No resulting bowel obstruction. There is moderate stool throughout the colon. No other colonic abnormalities are seen. Vascular/Lymphatic: There are no residual enlarged abdominal or pelvic lymph nodes. No significant vascular findings are present. Reproductive: The uterus and ovaries appear unremarkable. No evidence of adnexal mass.  Other: No ascites or peritoneal nodularity. Musculoskeletal: Stable sclerosis of the left superior pubic ramus. No lytic lesion or pathologic fracture identified. IMPRESSION: 1. Progressive improvement in mediastinal, right hilar and abdominal lymphadenopathy. 2. Stable sclerotic osseous metastases involving the left  first rib, T2 vertebral body and left superior pubic ramus. 3. New wall thickening involving the ileocecal valve and cecum, presumably inflammatory. Patient had colonoscopy 07/12/2015, making neoplasm unlikely. Short-term (1-2 month) CT follow-up recommended. 4. Cholelithiasis with mild chronic gallbladder wall thickening and pericholecystic fluid. Electronically Signed   By: Richardean Sale M.D.   On: 01/29/2017 12:52   Ct Abdomen Pelvis W Contrast  Result Date: 01/29/2017 CLINICAL DATA:  Status post excisional lymph node biopsy in the neck positive for carcinoma, possible breast primary. Restaging post chemotherapy. EXAM: CT CHEST, ABDOMEN, AND PELVIS WITH CONTRAST TECHNIQUE: Multidetector CT imaging of the chest, abdomen and pelvis was performed following the standard protocol during bolus administration of intravenous contrast. CONTRAST:  144m ISOVUE-300 IOPAMIDOL (ISOVUE-300) INJECTION 61% COMPARISON:  Chest CT 12/11/2016. CT of the chest and abdomen 11/10/2016. PET-CT 08/29/2016. FINDINGS: Neck findings are dictated separately. CT CHEST FINDINGS Cardiovascular: There are no significant vascular findings. Left subclavian Port-A-Cath extends to the mid SVC level. Mediastinum/Nodes: Subcarinal adenopathy has improved, measuring up to 2.6 x 1.8 cm on image 26. Mildly enlarged right hilar lymph nodes measuring up to 11 mm (image 31) and not significantly changed. There is no other mediastinal, axillary or internal mammary lymphadenopathy. The thyroid gland appears unremarkable. Small hiatal hernia noted. Lungs/Pleura: There is no pleural effusion. Stable linear right lower lobe scarring and  calcification. There is also mild biapical scarring. No suspicious pulmonary nodules. Musculoskeletal/Chest wall: Stable sclerosis of the left first rib and T2 vertebral body. No chest wall mass, suspicious breast findings or new osseous lesions identified. CT ABDOMEN AND PELVIS FINDINGS Hepatobiliary: The liver is normal in density without focal abnormality. Cholelithiasis, mild gallbladder wall thickening and pericholecystic fluid are noted. There is no significant biliary dilatation. Pancreas: Unremarkable. No pancreatic ductal dilatation or surrounding inflammatory changes. Spleen: Normal in size without focal abnormality. Adrenals/Urinary Tract: Both adrenal glands appear normal. The kidneys appear normal without evidence of urinary tract calculus, suspicious lesion or hydronephrosis. No bladder abnormalities are seen. Stomach/Bowel: The stomach and proximal small bowel appear normal. There is new bowel wall thickening in the right lower quadrant, involving the ileocecal valve and cecum. This most obvious on axial images 98 through 101. No resulting bowel obstruction. There is moderate stool throughout the colon. No other colonic abnormalities are seen. Vascular/Lymphatic: There are no residual enlarged abdominal or pelvic lymph nodes. No significant vascular findings are present. Reproductive: The uterus and ovaries appear unremarkable. No evidence of adnexal mass. Other: No ascites or peritoneal nodularity. Musculoskeletal: Stable sclerosis of the left superior pubic ramus. No lytic lesion or pathologic fracture identified. IMPRESSION: 1. Progressive improvement in mediastinal, right hilar and abdominal lymphadenopathy. 2. Stable sclerotic osseous metastases involving the left first rib, T2 vertebral body and left superior pubic ramus. 3. New wall thickening involving the ileocecal valve and cecum, presumably inflammatory. Patient had colonoscopy 07/12/2015, making neoplasm unlikely. Short-term (1-2 month) CT  follow-up recommended. 4. Cholelithiasis with mild chronic gallbladder wall thickening and pericholecystic fluid. Electronically Signed   By: WRichardean SaleM.D.   On: 01/29/2017 12:52    ASSESSMENT & PLAN:   Primary cancer of unknown site ([facility] # Status post excisional lymph node biopsy- positive for carcinoma- question breast primary [based on immunohistochemistry & Cancer type ID]; Currently on Adriamycin-Cytoxan s/p cycle #2 appx. 3 weeks ago; CT scan - improved/stable disease; except slight increase in the size of left neck level 2 LN [from 7 mm to 10 mm]  # Proceed with cycle #3.  Labs today reviewed;  acceptable for treatment today.   #  Bone mets- on x-geva [last feb 14th]; again in 10 days.   # Hypokalemia- 3.0/ s/p potassium supplementation; today 4.0. Stop supp.   # follow up in 3 week /chemo/ labs/on pro. MD.  # I reviewed the blood work- with the patient in detail; also reviewed the imaging independently [as summarized above]; and with the patient in detail.     Cammie Sickle, MD 01/31/2017 1:37 PM

## 2017-01-31 NOTE — Assessment & Plan Note (Addendum)
#   Status post excisional lymph node biopsy- positive for carcinoma- question breast primary [based on immunohistochemistry & Cancer type ID]; Currently on Adriamycin-Cytoxan s/p cycle #2 appx. 3 weeks ago; CT scan - improved/stable disease; except slight increase in the size of left neck level 2 LN [from 7 mm to 10 mm]  # Proceed with cycle #3. Labs today reviewed;  acceptable for treatment today.   #  Bone mets- on x-geva [last feb 14th]; again in 10 days.   # Hypokalemia- 3.0/ s/p potassium supplementation; today 4.0. Stop supp.   # follow up in 3 week /chemo/ labs/on pro. MD.  # I reviewed the blood work- with the patient in detail; also reviewed the imaging independently [as summarized above]; and with the patient in detail.

## 2017-01-31 NOTE — Progress Notes (Signed)
Patient here for follow-up for scan results. She has no medical complaints.

## 2017-02-05 NOTE — Addendum Note (Signed)
Encounter addended by: Vernie Murders, RT on: 02/05/2017 11:10 AM<BR>    Actions taken: Imaging Exam ended, Charge Capture section accepted

## 2017-02-05 NOTE — Addendum Note (Signed)
Encounter addended by: Vernie Murders, RT on: 02/05/2017 11:27 AM<BR>    Actions taken: Imaging Exam ended, Charge Capture section accepted

## 2017-02-09 ENCOUNTER — Inpatient Hospital Stay: Payer: 59

## 2017-02-09 ENCOUNTER — Telehealth: Payer: Self-pay | Admitting: *Deleted

## 2017-02-09 DIAGNOSIS — C7951 Secondary malignant neoplasm of bone: Secondary | ICD-10-CM

## 2017-02-09 DIAGNOSIS — C801 Malignant (primary) neoplasm, unspecified: Secondary | ICD-10-CM

## 2017-02-09 DIAGNOSIS — Z7689 Persons encountering health services in other specified circumstances: Secondary | ICD-10-CM | POA: Diagnosis not present

## 2017-02-09 DIAGNOSIS — C778 Secondary and unspecified malignant neoplasm of lymph nodes of multiple regions: Secondary | ICD-10-CM | POA: Diagnosis not present

## 2017-02-09 DIAGNOSIS — Z5111 Encounter for antineoplastic chemotherapy: Secondary | ICD-10-CM | POA: Diagnosis not present

## 2017-02-09 DIAGNOSIS — K219 Gastro-esophageal reflux disease without esophagitis: Secondary | ICD-10-CM | POA: Diagnosis not present

## 2017-02-09 DIAGNOSIS — E876 Hypokalemia: Secondary | ICD-10-CM | POA: Diagnosis not present

## 2017-02-09 DIAGNOSIS — Z79899 Other long term (current) drug therapy: Secondary | ICD-10-CM | POA: Diagnosis not present

## 2017-02-09 DIAGNOSIS — K5900 Constipation, unspecified: Secondary | ICD-10-CM | POA: Diagnosis not present

## 2017-02-09 LAB — COMPREHENSIVE METABOLIC PANEL
ALBUMIN: 4.3 g/dL (ref 3.5–5.0)
ALK PHOS: 113 U/L (ref 38–126)
ALT: 20 U/L (ref 14–54)
AST: 31 U/L (ref 15–41)
Anion gap: 6 (ref 5–15)
BILIRUBIN TOTAL: 0.4 mg/dL (ref 0.3–1.2)
BUN: 7 mg/dL (ref 6–20)
CALCIUM: 8.9 mg/dL (ref 8.9–10.3)
CO2: 31 mmol/L (ref 22–32)
CREATININE: 0.4 mg/dL — AB (ref 0.44–1.00)
Chloride: 101 mmol/L (ref 101–111)
GFR calc Af Amer: >60 mL/min (ref >60)
GLUCOSE: 69 mg/dL (ref 65–99)
Potassium: 2.8 mmol/L — ABNORMAL LOW (ref 3.5–5.1)
Sodium: 138 mmol/L (ref 135–145)
TOTAL PROTEIN: 7.1 g/dL (ref 6.5–8.1)

## 2017-02-09 LAB — CBC WITH DIFFERENTIAL/PLATELET
BASOS ABS: 0 10*3/uL (ref 0–0.1)
BASOS PCT: 1 %
Eosinophils Absolute: 0 10*3/uL (ref 0–0.7)
Eosinophils Relative: 2 %
HEMATOCRIT: 30.4 % — AB (ref 35.0–47.0)
Hemoglobin: 10.7 g/dL — ABNORMAL LOW (ref 12.0–16.0)
LYMPHS PCT: 19 %
Lymphs Abs: 0.2 10*3/uL — ABNORMAL LOW (ref 1.0–3.6)
MCH: 32.7 pg (ref 26.0–34.0)
MCHC: 35.2 g/dL (ref 32.0–36.0)
MCV: 92.8 fL (ref 80.0–100.0)
MONOS PCT: 17 %
Monocytes Absolute: 0.2 10*3/uL (ref 0.2–0.9)
NEUTROS ABS: 0.6 10*3/uL — AB (ref 1.4–6.5)
NEUTROS PCT: 61 %
Platelets: 62 10*3/uL — ABNORMAL LOW (ref 150–440)
RBC: 3.28 MIL/uL — ABNORMAL LOW (ref 3.80–5.20)
RDW: 13.9 % (ref 11.5–14.5)
WBC: 1 10*3/uL — CL (ref 3.6–11.0)

## 2017-02-09 MED ORDER — DENOSUMAB 120 MG/1.7ML ~~LOC~~ SOLN
120.0000 mg | Freq: Once | SUBCUTANEOUS | Status: AC
  Administered 2017-02-09: 120 mg via SUBCUTANEOUS
  Filled 2017-02-09: qty 1.7

## 2017-02-09 NOTE — Telephone Encounter (Signed)
Due to critical lab value on Potassium today, Dr Rogue Bussing requested for patient to take 59meq of k-dur daily for two weeks.  Patient advised.

## 2017-02-21 ENCOUNTER — Inpatient Hospital Stay: Payer: 59

## 2017-02-21 ENCOUNTER — Inpatient Hospital Stay (HOSPITAL_BASED_OUTPATIENT_CLINIC_OR_DEPARTMENT_OTHER): Payer: 59 | Admitting: Internal Medicine

## 2017-02-21 VITALS — BP 125/73 | HR 84 | Temp 97.5°F | Resp 18 | Ht 65.0 in | Wt 120.0 lb

## 2017-02-21 DIAGNOSIS — Z7689 Persons encountering health services in other specified circumstances: Secondary | ICD-10-CM | POA: Diagnosis not present

## 2017-02-21 DIAGNOSIS — C7951 Secondary malignant neoplasm of bone: Secondary | ICD-10-CM | POA: Diagnosis not present

## 2017-02-21 DIAGNOSIS — C801 Malignant (primary) neoplasm, unspecified: Secondary | ICD-10-CM

## 2017-02-21 DIAGNOSIS — C778 Secondary and unspecified malignant neoplasm of lymph nodes of multiple regions: Secondary | ICD-10-CM | POA: Diagnosis not present

## 2017-02-21 DIAGNOSIS — K219 Gastro-esophageal reflux disease without esophagitis: Secondary | ICD-10-CM | POA: Diagnosis not present

## 2017-02-21 DIAGNOSIS — T451X5A Adverse effect of antineoplastic and immunosuppressive drugs, initial encounter: Secondary | ICD-10-CM

## 2017-02-21 DIAGNOSIS — E876 Hypokalemia: Secondary | ICD-10-CM | POA: Diagnosis not present

## 2017-02-21 DIAGNOSIS — Z79899 Other long term (current) drug therapy: Secondary | ICD-10-CM | POA: Diagnosis not present

## 2017-02-21 DIAGNOSIS — R110 Nausea: Secondary | ICD-10-CM

## 2017-02-21 DIAGNOSIS — K5900 Constipation, unspecified: Secondary | ICD-10-CM | POA: Diagnosis not present

## 2017-02-21 DIAGNOSIS — Z5111 Encounter for antineoplastic chemotherapy: Secondary | ICD-10-CM | POA: Diagnosis not present

## 2017-02-21 LAB — COMPREHENSIVE METABOLIC PANEL
ALBUMIN: 3.9 g/dL (ref 3.5–5.0)
ALK PHOS: 90 U/L (ref 38–126)
ALT: 21 U/L (ref 14–54)
AST: 31 U/L (ref 15–41)
Anion gap: 4 — ABNORMAL LOW (ref 5–15)
BILIRUBIN TOTAL: 0.4 mg/dL (ref 0.3–1.2)
BUN: 7 mg/dL (ref 6–20)
CALCIUM: 8.8 mg/dL — AB (ref 8.9–10.3)
CO2: 28 mmol/L (ref 22–32)
CREATININE: 0.37 mg/dL — AB (ref 0.44–1.00)
Chloride: 104 mmol/L (ref 101–111)
GFR calc non Af Amer: >60 mL/min (ref >60)
Glucose, Bld: 98 mg/dL (ref 65–99)
Potassium: 3.9 mmol/L (ref 3.5–5.1)
SODIUM: 136 mmol/L (ref 135–145)
Total Protein: 7 g/dL (ref 6.5–8.1)

## 2017-02-21 LAB — CBC WITH DIFFERENTIAL/PLATELET
BASOS PCT: 1 %
Basophils Absolute: 0 10*3/uL (ref 0–0.1)
EOS ABS: 0 10*3/uL (ref 0–0.7)
Eosinophils Relative: 0 %
HCT: 28.7 % — ABNORMAL LOW (ref 35.0–47.0)
Hemoglobin: 10.1 g/dL — ABNORMAL LOW (ref 12.0–16.0)
Lymphocytes Relative: 11 %
Lymphs Abs: 0.3 10*3/uL — ABNORMAL LOW (ref 1.0–3.6)
MCH: 33.1 pg (ref 26.0–34.0)
MCHC: 35.3 g/dL (ref 32.0–36.0)
MCV: 93.9 fL (ref 80.0–100.0)
Monocytes Absolute: 0.6 10*3/uL (ref 0.2–0.9)
Monocytes Relative: 20 %
NEUTROS ABS: 2 10*3/uL (ref 1.4–6.5)
NEUTROS PCT: 68 %
Platelets: 214 10*3/uL (ref 150–440)
RBC: 3.05 MIL/uL — AB (ref 3.80–5.20)
RDW: 15.9 % — ABNORMAL HIGH (ref 11.5–14.5)
WBC: 3 10*3/uL — AB (ref 3.6–11.0)

## 2017-02-21 MED ORDER — HEPARIN SOD (PORK) LOCK FLUSH 100 UNIT/ML IV SOLN
500.0000 [IU] | Freq: Once | INTRAVENOUS | Status: AC
  Administered 2017-02-21: 500 [IU] via INTRAVENOUS

## 2017-02-21 MED ORDER — PALONOSETRON HCL INJECTION 0.25 MG/5ML
0.2500 mg | Freq: Once | INTRAVENOUS | Status: AC
  Administered 2017-02-21: 0.25 mg via INTRAVENOUS
  Filled 2017-02-21: qty 5

## 2017-02-21 MED ORDER — SODIUM CHLORIDE 0.9 % IV SOLN
Freq: Once | INTRAVENOUS | Status: AC
  Administered 2017-02-21: 12:00:00 via INTRAVENOUS
  Filled 2017-02-21: qty 1000

## 2017-02-21 MED ORDER — SODIUM CHLORIDE 0.9% FLUSH
10.0000 mL | Freq: Once | INTRAVENOUS | Status: AC
  Administered 2017-02-21: 10 mL via INTRAVENOUS
  Filled 2017-02-21: qty 10

## 2017-02-21 MED ORDER — PEGFILGRASTIM 6 MG/0.6ML ~~LOC~~ PSKT
6.0000 mg | PREFILLED_SYRINGE | Freq: Once | SUBCUTANEOUS | Status: AC
  Administered 2017-02-21: 6 mg via SUBCUTANEOUS
  Filled 2017-02-21: qty 0.6

## 2017-02-21 MED ORDER — SODIUM CHLORIDE 0.9 % IV SOLN
1000.0000 mg | Freq: Once | INTRAVENOUS | Status: AC
  Administered 2017-02-21: 1000 mg via INTRAVENOUS
  Filled 2017-02-21: qty 50

## 2017-02-21 MED ORDER — DOXORUBICIN HCL CHEMO IV INJECTION 2 MG/ML
60.0000 mg/m2 | Freq: Once | INTRAVENOUS | Status: AC
  Administered 2017-02-21: 96 mg via INTRAVENOUS
  Filled 2017-02-21: qty 48

## 2017-02-21 MED ORDER — SODIUM CHLORIDE 0.9 % IV SOLN
Freq: Once | INTRAVENOUS | Status: AC
  Administered 2017-02-21: 12:00:00 via INTRAVENOUS
  Filled 2017-02-21: qty 5

## 2017-02-21 MED ORDER — HEPARIN SOD (PORK) LOCK FLUSH 100 UNIT/ML IV SOLN
INTRAVENOUS | Status: AC
  Filled 2017-02-21: qty 5

## 2017-02-21 NOTE — Progress Notes (Signed)
[facility] NOTE  Patient Care Team: Rusty Aus, MD as PCP - General (Internal Medicine)  CHIEF COMPLAINTS/PURPOSE OF CONSULTATION:   Oncology History   # SEP 2017- LYMPHADENOPATHY- extensive- abdominal/RP/ bil neck LN R>L. 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   # Bone mets- X-geva q 6 W  # MOLECULAR TESTING- ER/PR/her 2 NEG; MSI-STABLE. OCT 2017- MRI Bil Breast NEG. PDL-1- NEG [0%]- Found One [NOV 2017]- PTEN **[others]  # Lung RUL nodule [benign s/p resection; 2009- Dr.Oaks]  # spring 2017- colonoscopy [Dr.Elliot; polyps]; June mammo-NEG     Primary cancer of unknown site [facility])   08/25/2016 Initial Diagnosis    Primary cancer of unknown site [facility])       Oncology History   # SEP 2017- LYMPHADENOPATHY- extensive- abdominal/RP/ bil neck LN R>L. 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   # Bone mets- X-geva q 6 W  # MOLECULAR TESTING- ER/PR/her 2 NEG; MSI-STABLE. OCT 2017- MRI Bil Breast NEG. PDL-1- NEG [0%]- Found One [NOV 2017]- PTEN **[others]  # Lung RUL nodule [benign s/p resection; 2009- Dr.Oaks]  # spring 2017- colonoscopy [Dr.Elliot; polyps]; June mammo-NEG     Primary cancer of unknown site [facility])   08/25/2016 Initial Diagnosis    Primary cancer of unknown site [facility])       HISTORY OF PRESENTING ILLNESS:  Sylvia Jones 61 y.o.  female with a history of carcinoma of unknown primary [possible breast cancer based on IHC] Chemotherapy with Adriamycin Cytoxan every 3 weeks status post cycle #3 approximately 3  weeks ago is here for follow-up.  Patient denies any new lumps or bumps. She feels her left neck lymph node has not resolved. This has not gotten any bigger. Appetite is good. No weight loss. No nausea no vomiting.  Denies any diarrhea. No cough or shortness of breath. Complains of fatigue for 4-5 days postchemotherapy. Then improves. Denies any significant pain. Not any pain pills. Constipation with the first few days then improves.  ROS: A complete 10 point review of system is done which is negative except mentioned above in history of present illness  MEDICAL HISTORY:  Past Medical History:  Diagnosis Date  . Arthritis   . Cancer ([place])    lymph nodes, in process of diagnosing  . Cervical disc disease    upper cervical, denied numbness / tingling BUE  . Cervical stenosis of spine   . Fracture 04/2016   left pubis bone-atypical   . Lymphadenopathy 09/05/2016   R clavical region  . Neuromuscular disorder ([place])    compressed disc in neck when 38 or 61 years old  . Post-menopausal 2011  . Primary cancer of unknown site [facility])   . Tubular adenoma     SURGICAL HISTORY: Past Surgical History:  Procedure Laterality Date  . APPENDECTOMY  1986  . BREAST BIOPSY Right    at age 42  . CESAREAN SECTION  1988  . CESAREAN SECTION  1988  . COLONOSCOPY WITH PROPOFOL N/A 07/12/2015   Procedure: COLONOSCOPY WITH PROPOFOL;  Surgeon: Manya Silvas, MD;  Location: [facility]  ENDOSCOPY;  Service: Endoscopy;  Laterality: N/A;  . EXPLORATORY LAPAROTOMY  1986  . LYMPH GLAND EXCISION    . MASS BIOPSY N/A 09/05/2016   Procedure: NECK MASS BIOPSY;  Surgeon: Beverly Gust, MD;  Location: [facility] ORS;  Service: ENT;  Laterality: N/A;  . MASS EXCISION  1986   to remove mass outside the uterus. procedure was combined tacked uterus, reposition of fallopian tubes   . PORTACATH PLACEMENT Left 09/20/2016   Procedure: INSERTION PORT-A-CATH;  Surgeon: Olean Ree, MD;  Location: [facility] ORS;  Service: General;   Laterality: Left;  . THORACOTOMY Right 03/2008   RLL Wedge Resection- Dr. 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 Marital status: Married    Spouse name: N/A  . Number of children: N/A  . Years of education: N/A   Occupational History  . Not on file.   Social History Main Topics  . Smoking status: Never Smoker  . Smokeless tobacco: Never Used  . Alcohol use No  . Drug use: No  . Sexual activity: Yes   Other Topics Concern  . Not on file   Social History Narrative  . No narrative on file    FAMILY HISTORY: father- ? Cancer died 66. .. Brother- kasposi sarcoma Family History  Problem Relation Age of Onset  . Cancer Father     medistinal  carcinoma  . Heart attack Father   . Hypertension Brother   . Diabetes Maternal Grandmother   . Diabetes Paternal Grandmother   . Cancer Brother     Capa C sarcoma    ALLERGIES:  is allergic to sulfa antibiotics.  MEDICATIONS:  Current Outpatient Prescriptions  Medication Sig Dispense Refill  . Calcium Carb-Cholecalciferol (CALCIUM 600+D3 PO) Take 1 Dose by mouth daily.     Marland Kitchen lidocaine-prilocaine (EMLA) cream Apply 1 application topically as needed. Apply generously over the Mediport 45 minutes prior to chemotherapy. 30 g 0  . polyethylene glycol (MIRALAX / GLYCOLAX) packet Take 17 g by mouth daily.    . potassium chloride SA (K-DUR,KLOR-CON) 20 MEQ tablet 1 pill twice a day x1 week. 30 tablet 3  . Sennosides (SENNA LAX PO) Take 1 Dose by mouth daily as needed.     . cyclobenzaprine (FLEXERIL) 10 MG tablet Take 10 mg by mouth 3 (three) times daily as needed for muscle spasms.    Marland Kitchen HYDROcodone-acetaminophen (NORCO/VICODIN) 5-325 MG tablet Take 1 tablet by mouth every 6 (six) hours as needed for moderate pain.    Marland Kitchen lactulose (CEPHULAC) 10 g packet Take 10 grams three times daily for the next 2 days. Then 10 grams daily, can  increase to 10 grams three times a day for future concerns for worsening constipation (Patient not taking: Reported on 01/10/2017) 30 each 0  . ondansetron (ZOFRAN) 8 MG tablet Take 8 mg by mouth every 8 (eight) hours as needed for nausea or vomiting.    . prochlorperazine (COMPAZINE) 10 MG tablet Take 10 mg by mouth every 6 (six) hours as needed for nausea or vomiting.     No current facility-administered medications for this visit.    Facility-Administered Medications Ordered in Other Visits  Medication Dose Route Frequency Provider Last Rate Last Dose  . cyclophosphamide (CYTOXAN) 1,000 mg in sodium chloride 0.9 % 250 mL chemo infusion  1,000 mg Intravenous Once Cammie Sickle, MD   Stopped at 02/21/17 1320  .  pegfilgrastim (NEULASTA ONPRO KIT) injection 6 mg  6 mg Subcutaneous Once Cammie Sickle, MD          .  PHYSICAL EXAMINATION: ECOG PERFORMANCE STATUS: 1 - Symptomatic but completely ambulatory  Vitals:   02/21/17 1020  BP: 125/73  Pulse: 84  Resp: 18  Temp: 97.5 F (36.4 C)   Filed Weights   02/21/17 1020  Weight: 120 lb (54.4 kg)    GENERAL: Well-nourished well-developed; Alert, no distress and comfortable.   With husband. Walking herself. EYES: no pallor or icterus OROPHARYNX: no thrush or ulceration; good dentition  NECK: supple, no masses felt LYMPH:  Approximately 1 cm lymph node felt in the left submandibular region; improvement of the left neck adenopathy.  LUNGS: clear to auscultation and  No wheeze or crackles HEART/CVS: regular rate & rhythm and no murmurs; No lower extremity edema. ABDOMEN: abdomen soft, non-tender and normal bowel sounds Musculoskeletal:no cyanosis of digits and no clubbing  PSYCH: alert & oriented x 3 with fluent speech NEURO: no focal motor/sensory deficits SKIN:  no rashes or significant lesions.    LABORATORY DATA:  I have reviewed the data as listed Lab Results  Component Value Date   WBC 3.0 (L) 02/21/2017   HGB  10.1 (L) 02/21/2017   HCT 28.7 (L) 02/21/2017   MCV 93.9 02/21/2017   PLT 214 02/21/2017    Recent Labs  01/31/17 1006 02/09/17 1018 02/21/17 1001  NA 136 138 136  K 4.0 2.8* 3.9  CL 104 101 104  CO2 _0 GLUCOSE 83 69 98  BUN _1 CREATININE 0.34* 0.40* 0.37*  CALCIUM 9.9 8.9 8.8*  GFRNONAA >60 >60 >60  GFRAA >60 >60 >60  PROT 7.1 7.1 7.0  ALBUMIN 4.1 4.3 3.9  AST 33 31 31  ALT _2 ALKPHOS 104 113 90  BILITOT 0.3 0.4 0.4    RADIOGRAPHIC STUDIES: I have personally reviewed the radiological images as listed and agreed with the findings in the report. Ct Soft Tissue Neck W Contrast  Result Date: 01/29/2017 CLINICAL DATA:  61 year old female with metastatic lymphadenopathy in the neck abdomen and pelvis, as well as lytic bone metastases. Adenocarcinoma of unknown primary. Restaging. Subsequent encounter. EXAM: CT NECK WITH CONTRAST TECHNIQUE: Multidetector CT imaging of the neck was performed using the standard protocol following the bolus administration of intravenous contrast. CONTRAST:  154m ISOVUE-300 IOPAMIDOL (ISOVUE-300) INJECTION 61% in conjunction with contrast enhanced imaging of the chest, abdomen, and pelvis reported separately. COMPARISON:  Neck CT 11/10/2016 and earlier. FINDINGS: Pharynx and larynx: Larynx and pharynx soft tissue contours remain normal. Negative parapharyngeal and retropharyngeal spaces. Salivary glands: Negative sublingual space. Submandibular glands and parotid glands remain normal. Thyroid: Negative. Lymph nodes: The previously described cluster of subcentimeter left level 2 B lymph nodes in December are stable to mildly regressed (series 3, image 26 today). The previously-seen 7 mm left level IIa lymph node appears larger, now 10 mm short axis (series 3, image 37 today). Compare sagittal image 100 today to sagittal image 84 in December. Left level IIIb lymph node on series 3, image 42 today is regressed. Left level 4 lymph nodes with  mild residual surrounding inflammatory stranding are further regressed. No other lymph node enlargement identified. No abnormal right side nodes. Stable right level IIa node measuring 5-6 mm on series 3, image 34 today. Vascular: Major vascular structures in the neck and at the skullbase remain patent. No cervical carotid atherosclerosis or  stenosis. Limited intracranial: Negative. Visualized orbits: Negative. Mastoids and visualized paranasal sinuses: Visualized paranasal sinuses and mastoids are stable and well pneumatized. Skeleton: Pathologic fracture of the C7 spinous process is more sclerotic, but this seems to be related to interval healing with callus. Heterogeneity in the T2 vertebral body has not significantly changed and there is no pathologic vertebral body fracture. No new osseous lesion identified in the neck. Upper chest: Chest CT today reported separately. IMPRESSION: 1. A single left level IIa lymph node appears larger than in December, now 10 mm short axis (previously 7 mm). Suspicious for focal progression. However, all other bilateral cervical lymph nodes are stable to regressed. 2. Interval healing of the C7 spinous process pathologic fracture. T2 vertebral body metastasis appears stable. 3. Chest abdomen and pelvis CTs today are reported separately. Electronically Signed   By: Genevie Ann M.D.   On: 01/29/2017 13:02   Ct Chest W Contrast  Result Date: 01/29/2017 CLINICAL DATA:  Status post excisional lymph node biopsy in the neck positive for carcinoma, possible breast primary. Restaging post chemotherapy. EXAM: CT CHEST, ABDOMEN, AND PELVIS WITH CONTRAST TECHNIQUE: Multidetector CT imaging of the chest, abdomen and pelvis was performed following the standard protocol during bolus administration of intravenous contrast. CONTRAST:  140m ISOVUE-300 IOPAMIDOL (ISOVUE-300) INJECTION 61% COMPARISON:  Chest CT 12/11/2016. CT of the chest and abdomen 11/10/2016. PET-CT 08/29/2016. FINDINGS: Neck  findings are dictated separately. CT CHEST FINDINGS Cardiovascular: There are no significant vascular findings. Left subclavian Port-A-Cath extends to the mid SVC level. Mediastinum/Nodes: Subcarinal adenopathy has improved, measuring up to 2.6 x 1.8 cm on image 26. Mildly enlarged right hilar lymph nodes measuring up to 11 mm (image 31) and not significantly changed. There is no other mediastinal, axillary or internal mammary lymphadenopathy. The thyroid gland appears unremarkable. Small hiatal hernia noted. Lungs/Pleura: There is no pleural effusion. Stable linear right lower lobe scarring and calcification. There is also mild biapical scarring. No suspicious pulmonary nodules. Musculoskeletal/Chest wall: Stable sclerosis of the left first rib and T2 vertebral body. No chest wall mass, suspicious breast findings or new osseous lesions identified. CT ABDOMEN AND PELVIS FINDINGS Hepatobiliary: The liver is normal in density without focal abnormality. Cholelithiasis, mild gallbladder wall thickening and pericholecystic fluid are noted. There is no significant biliary dilatation. Pancreas: Unremarkable. No pancreatic ductal dilatation or surrounding inflammatory changes. Spleen: Normal in size without focal abnormality. Adrenals/Urinary Tract: Both adrenal glands appear normal. The kidneys appear normal without evidence of urinary tract calculus, suspicious lesion or hydronephrosis. No bladder abnormalities are seen. Stomach/Bowel: The stomach and proximal small bowel appear normal. There is new bowel wall thickening in the right lower quadrant, involving the ileocecal valve and cecum. This most obvious on axial images 98 through 101. No resulting bowel obstruction. There is moderate stool throughout the colon. No other colonic abnormalities are seen. Vascular/Lymphatic: There are no residual enlarged abdominal or pelvic lymph nodes. No significant vascular findings are present. Reproductive: The uterus and ovaries  appear unremarkable. No evidence of adnexal mass. Other: No ascites or peritoneal nodularity. Musculoskeletal: Stable sclerosis of the left superior pubic ramus. No lytic lesion or pathologic fracture identified. IMPRESSION: 1. Progressive improvement in mediastinal, right hilar and abdominal lymphadenopathy. 2. Stable sclerotic osseous metastases involving the left first rib, T2 vertebral body and left superior pubic ramus. 3. New wall thickening involving the ileocecal valve and cecum, presumably inflammatory. Patient had colonoscopy 07/12/2015, making neoplasm unlikely. Short-term (1-2 month) CT follow-up recommended. 4. Cholelithiasis with mild chronic  gallbladder wall thickening and pericholecystic fluid. Electronically Signed   By: Richardean Sale M.D.   On: 01/29/2017 12:52   Ct Abdomen Pelvis W Contrast  Result Date: 01/29/2017 CLINICAL DATA:  Status post excisional lymph node biopsy in the neck positive for carcinoma, possible breast primary. Restaging post chemotherapy. EXAM: CT CHEST, ABDOMEN, AND PELVIS WITH CONTRAST TECHNIQUE: Multidetector CT imaging of the chest, abdomen and pelvis was performed following the standard protocol during bolus administration of intravenous contrast. CONTRAST:  188m ISOVUE-300 IOPAMIDOL (ISOVUE-300) INJECTION 61% COMPARISON:  Chest CT 12/11/2016. CT of the chest and abdomen 11/10/2016. PET-CT 08/29/2016. FINDINGS: Neck findings are dictated separately. CT CHEST FINDINGS Cardiovascular: There are no significant vascular findings. Left subclavian Port-A-Cath extends to the mid SVC level. Mediastinum/Nodes: Subcarinal adenopathy has improved, measuring up to 2.6 x 1.8 cm on image 26. Mildly enlarged right hilar lymph nodes measuring up to 11 mm (image 31) and not significantly changed. There is no other mediastinal, axillary or internal mammary lymphadenopathy. The thyroid gland appears unremarkable. Small hiatal hernia noted. Lungs/Pleura: There is no pleural effusion.  Stable linear right lower lobe scarring and calcification. There is also mild biapical scarring. No suspicious pulmonary nodules. Musculoskeletal/Chest wall: Stable sclerosis of the left first rib and T2 vertebral body. No chest wall mass, suspicious breast findings or new osseous lesions identified. CT ABDOMEN AND PELVIS FINDINGS Hepatobiliary: The liver is normal in density without focal abnormality. Cholelithiasis, mild gallbladder wall thickening and pericholecystic fluid are noted. There is no significant biliary dilatation. Pancreas: Unremarkable. No pancreatic ductal dilatation or surrounding inflammatory changes. Spleen: Normal in size without focal abnormality. Adrenals/Urinary Tract: Both adrenal glands appear normal. The kidneys appear normal without evidence of urinary tract calculus, suspicious lesion or hydronephrosis. No bladder abnormalities are seen. Stomach/Bowel: The stomach and proximal small bowel appear normal. There is new bowel wall thickening in the right lower quadrant, involving the ileocecal valve and cecum. This most obvious on axial images 98 through 101. No resulting bowel obstruction. There is moderate stool throughout the colon. No other colonic abnormalities are seen. Vascular/Lymphatic: There are no residual enlarged abdominal or pelvic lymph nodes. No significant vascular findings are present. Reproductive: The uterus and ovaries appear unremarkable. No evidence of adnexal mass. Other: No ascites or peritoneal nodularity. Musculoskeletal: Stable sclerosis of the left superior pubic ramus. No lytic lesion or pathologic fracture identified. IMPRESSION: 1. Progressive improvement in mediastinal, right hilar and abdominal lymphadenopathy. 2. Stable sclerotic osseous metastases involving the left first rib, T2 vertebral body and left superior pubic ramus. 3. New wall thickening involving the ileocecal valve and cecum, presumably inflammatory. Patient had colonoscopy 07/12/2015, making  neoplasm unlikely. Short-term (1-2 month) CT follow-up recommended. 4. Cholelithiasis with mild chronic gallbladder wall thickening and pericholecystic fluid. Electronically Signed   By: WRichardean SaleM.D.   On: 01/29/2017 12:52    ASSESSMENT & PLAN:   Primary cancer of unknown site ([facility] # Status post excisional lymph node biopsy- positive for carcinoma- question breast primary [based on immunohistochemistry & Cancer type ID]; Currently on Adriamycin-Cytoxan; Feb 2018- CT scan - improved/stable disease; except slight increase in the size of left neck level 2 LN [from 7 mm to 10 mm]  # Proceed with cycle #4. Labs today reviewed;  acceptable for treatment today. Discussed that we'll discontinue Adriamycin after this cycle. We'll plan to get CT scan neck chest abdomen pelvis in approximately 4 weeks. If continued response discussed the option of continuing Cytoxan every 3 weeks.   #  Bone mets- on x-geva [last march 16th]; plan with next chemo.  # Hypokalemia- 3.9/ ;continue potassium supplementation;    # follow up in 4 week /chemo/ labs/cytoxan alone; X-geva; on pro. MD.CT scan prior.     Cammie Sickle, MD 02/21/2017 1:08 PM

## 2017-02-21 NOTE — Assessment & Plan Note (Addendum)
#   Status post excisional lymph node biopsy- positive for carcinoma- question breast primary [based on immunohistochemistry & Cancer type ID]; Currently on Adriamycin-Cytoxan; Feb 2018- CT scan - improved/stable disease; except slight increase in the size of left neck level 2 LN [from 7 mm to 10 mm]  # Proceed with cycle #4. Labs today reviewed;  acceptable for treatment today. Discussed that we'll discontinue Adriamycin after this cycle. We'll plan to get CT scan neck chest abdomen pelvis in approximately 4 weeks. If continued response discussed the option of continuing Cytoxan every 3 weeks.   #  Bone mets- on x-geva [last march 16th]; plan with next chemo.  # Hypokalemia- 3.9/ ;continue potassium supplementation;    # follow up in 4 week /chemo/ labs/cytoxan alone; X-geva; on pro. MD.CT scan prior.

## 2017-02-23 ENCOUNTER — Inpatient Hospital Stay: Payer: 59

## 2017-02-23 ENCOUNTER — Telehealth: Payer: Self-pay | Admitting: *Deleted

## 2017-02-23 DIAGNOSIS — C801 Malignant (primary) neoplasm, unspecified: Secondary | ICD-10-CM | POA: Diagnosis not present

## 2017-02-23 DIAGNOSIS — C7951 Secondary malignant neoplasm of bone: Secondary | ICD-10-CM | POA: Diagnosis not present

## 2017-02-23 DIAGNOSIS — R309 Painful micturition, unspecified: Secondary | ICD-10-CM

## 2017-02-23 DIAGNOSIS — K5900 Constipation, unspecified: Secondary | ICD-10-CM | POA: Diagnosis not present

## 2017-02-23 DIAGNOSIS — R350 Frequency of micturition: Secondary | ICD-10-CM

## 2017-02-23 DIAGNOSIS — Z7689 Persons encountering health services in other specified circumstances: Secondary | ICD-10-CM | POA: Diagnosis not present

## 2017-02-23 DIAGNOSIS — Z79899 Other long term (current) drug therapy: Secondary | ICD-10-CM | POA: Diagnosis not present

## 2017-02-23 DIAGNOSIS — Z5111 Encounter for antineoplastic chemotherapy: Secondary | ICD-10-CM | POA: Diagnosis not present

## 2017-02-23 DIAGNOSIS — K219 Gastro-esophageal reflux disease without esophagitis: Secondary | ICD-10-CM | POA: Diagnosis not present

## 2017-02-23 DIAGNOSIS — E876 Hypokalemia: Secondary | ICD-10-CM | POA: Diagnosis not present

## 2017-02-23 DIAGNOSIS — C778 Secondary and unspecified malignant neoplasm of lymph nodes of multiple regions: Secondary | ICD-10-CM | POA: Diagnosis not present

## 2017-02-23 LAB — URINALYSIS, COMPLETE (UACMP) WITH MICROSCOPIC
BACTERIA UA: NONE SEEN
Bilirubin Urine: NEGATIVE
GLUCOSE, UA: NEGATIVE mg/dL
HGB URINE DIPSTICK: NEGATIVE
Ketones, ur: NEGATIVE mg/dL
LEUKOCYTES UA: NEGATIVE
NITRITE: NEGATIVE
PH: 5 (ref 5.0–8.0)
PROTEIN: NEGATIVE mg/dL
Specific Gravity, Urine: 1.016 (ref 1.005–1.030)

## 2017-02-23 NOTE — Telephone Encounter (Signed)
Contacted patient with results. Encouraged pt to increase her po fluid intake. Negative for UTI.

## 2017-02-23 NOTE — Telephone Encounter (Signed)
Called to report that she is having frequent small amts of urination and has pain/ burning sensation with it. Denies fever, foul odor , or other symptoms. Please advise

## 2017-02-23 NOTE — Telephone Encounter (Signed)
-----   Message from Cammie Sickle, MD sent at 02/23/2017  4:14 PM EDT ----- Hassan Rowan- please inform pt that she is recommend increasing fluid intake; no infection based on UA so far. Thx

## 2017-02-23 NOTE — Telephone Encounter (Signed)
Per D r Brahmanday UA C&S pt agrees to come in now for sampling. Lab informed

## 2017-02-25 LAB — URINE CULTURE: CULTURE: NO GROWTH

## 2017-03-05 ENCOUNTER — Other Ambulatory Visit: Payer: Self-pay

## 2017-03-05 ENCOUNTER — Telehealth: Payer: Self-pay | Admitting: *Deleted

## 2017-03-05 ENCOUNTER — Inpatient Hospital Stay: Payer: 59 | Admitting: *Deleted

## 2017-03-05 ENCOUNTER — Telehealth: Payer: Self-pay

## 2017-03-05 DIAGNOSIS — D61818 Other pancytopenia: Secondary | ICD-10-CM | POA: Insufficient documentation

## 2017-03-05 DIAGNOSIS — C801 Malignant (primary) neoplasm, unspecified: Secondary | ICD-10-CM | POA: Insufficient documentation

## 2017-03-05 DIAGNOSIS — Z79899 Other long term (current) drug therapy: Secondary | ICD-10-CM | POA: Insufficient documentation

## 2017-03-05 DIAGNOSIS — E876 Hypokalemia: Secondary | ICD-10-CM | POA: Diagnosis not present

## 2017-03-05 DIAGNOSIS — R300 Dysuria: Secondary | ICD-10-CM | POA: Diagnosis not present

## 2017-03-05 DIAGNOSIS — C7951 Secondary malignant neoplasm of bone: Secondary | ICD-10-CM | POA: Diagnosis not present

## 2017-03-05 DIAGNOSIS — R350 Frequency of micturition: Secondary | ICD-10-CM

## 2017-03-05 DIAGNOSIS — C778 Secondary and unspecified malignant neoplasm of lymph nodes of multiple regions: Secondary | ICD-10-CM | POA: Insufficient documentation

## 2017-03-05 DIAGNOSIS — Z95828 Presence of other vascular implants and grafts: Secondary | ICD-10-CM

## 2017-03-05 LAB — COMPREHENSIVE METABOLIC PANEL WITH GFR
ALT: 23 U/L (ref 14–54)
AST: 26 U/L (ref 15–41)
Albumin: 4.1 g/dL (ref 3.5–5.0)
Alkaline Phosphatase: 128 U/L — ABNORMAL HIGH (ref 38–126)
Anion gap: 7 (ref 5–15)
BUN: 8 mg/dL (ref 6–20)
CO2: 27 mmol/L (ref 22–32)
Calcium: 9 mg/dL (ref 8.9–10.3)
Chloride: 104 mmol/L (ref 101–111)
Creatinine, Ser: 0.35 mg/dL — ABNORMAL LOW (ref 0.44–1.00)
GFR calc Af Amer: >60 mL/min
GFR calc non Af Amer: >60 mL/min
Glucose, Bld: 97 mg/dL (ref 65–99)
Potassium: 3.9 mmol/L (ref 3.5–5.1)
Sodium: 138 mmol/L (ref 135–145)
Total Bilirubin: 0.4 mg/dL (ref 0.3–1.2)
Total Protein: 6.9 g/dL (ref 6.5–8.1)

## 2017-03-05 LAB — CBC WITH DIFFERENTIAL/PLATELET
BASOS ABS: 0 10*3/uL (ref 0–0.1)
Basophils Relative: 1 %
Eosinophils Absolute: 0 10*3/uL (ref 0–0.7)
Eosinophils Relative: 0 %
HEMATOCRIT: 25.4 % — AB (ref 35.0–47.0)
HEMOGLOBIN: 8.9 g/dL — AB (ref 12.0–16.0)
LYMPHS PCT: 9 %
Lymphs Abs: 0.3 10*3/uL — ABNORMAL LOW (ref 1.0–3.6)
MCH: 33.2 pg (ref 26.0–34.0)
MCHC: 35.1 g/dL (ref 32.0–36.0)
MCV: 94.7 fL (ref 80.0–100.0)
MONO ABS: 0.5 10*3/uL (ref 0.2–0.9)
Monocytes Relative: 15 %
NEUTROS ABS: 2.6 10*3/uL (ref 1.4–6.5)
NEUTROS PCT: 75 %
Platelets: 57 10*3/uL — ABNORMAL LOW (ref 150–440)
RBC: 2.68 MIL/uL — AB (ref 3.80–5.20)
RDW: 15.6 % — ABNORMAL HIGH (ref 11.5–14.5)
WBC: 3.5 10*3/uL — AB (ref 3.6–11.0)

## 2017-03-05 LAB — URINALYSIS, COMPLETE (UACMP) WITH MICROSCOPIC
BACTERIA UA: NONE SEEN
BILIRUBIN URINE: NEGATIVE
Glucose, UA: NEGATIVE mg/dL
Hgb urine dipstick: NEGATIVE
KETONES UR: NEGATIVE mg/dL
LEUKOCYTES UA: NEGATIVE
Nitrite: NEGATIVE
PH: 6 (ref 5.0–8.0)
PROTEIN: NEGATIVE mg/dL
SQUAMOUS EPITHELIAL / LPF: NONE SEEN
Specific Gravity, Urine: 1.004 — ABNORMAL LOW (ref 1.005–1.030)
WBC UA: NONE SEEN WBC/hpf (ref 0–5)

## 2017-03-05 LAB — MAGNESIUM: MAGNESIUM: 1.9 mg/dL (ref 1.7–2.4)

## 2017-03-05 LAB — LIPID PANEL
CHOL/HDL RATIO: 2.6 ratio
Cholesterol: 105 mg/dL (ref 0–200)
HDL: 41 mg/dL (ref >40)
LDL CALC: 49 mg/dL (ref 0–99)
Triglycerides: 77 mg/dL (ref <150)
VLDL: 15 mg/dL (ref 0–40)

## 2017-03-05 LAB — TSH: TSH: 1.617 u[IU]/mL (ref 0.350–4.500)

## 2017-03-05 MED ORDER — SODIUM CHLORIDE 0.9% FLUSH
10.0000 mL | Freq: Once | INTRAVENOUS | Status: AC
  Administered 2017-03-05: 10 mL via INTRAVENOUS
  Filled 2017-03-05: qty 10

## 2017-03-05 MED ORDER — PHENAZOPYRIDINE HCL 100 MG PO TABS: 100.0000 mg | ORAL_TABLET | Freq: Three times a day (TID) | ORAL | 0 refills | Status: DC | PRN

## 2017-03-05 MED ORDER — HEPARIN SOD (PORK) LOCK FLUSH 100 UNIT/ML IV SOLN: 500.0000 [IU] | Freq: Once | INTRAVENOUS | Status: DC

## 2017-03-05 NOTE — Telephone Encounter (Signed)
Patient is requesting to speak to someone for recommendations for cream to help with "dryness".    Patient was in lab waiting when she inquired about who to talk to about cream for "dryness".  I told her I would get the message over to Dr. Aletha Halim nurse.  But, patient left before I could get more information.

## 2017-03-05 NOTE — Telephone Encounter (Signed)
Have patient collect a u/a and urine culture. rx sent to pharmacy for pyridium 100 mg three times a day as needed for bladder spasms.

## 2017-03-05 NOTE — Telephone Encounter (Signed)
Asking for a cream for her perineal area which is very dry and thin, and "shrively"  Also feels like she needs to have BM. Please advise

## 2017-03-05 NOTE — Telephone Encounter (Signed)
Pt needs appt. Pls sched. Thx.

## 2017-03-05 NOTE — Telephone Encounter (Signed)
Pt calling.  She is on CHEMO and is having pain and discomfort in her private area, painful frequent urination.  She was tested a week ago Fri for UTI and it was neg.  Pt wants to be ck'd.  She wonders if the chemo is affecting the tissues making them dry and sensitive.  [phone].

## 2017-03-05 NOTE — Telephone Encounter (Signed)
Spoke with patient. Pt very emotional today. She reports sexual dysfunction. States, "My vagina, my urethra and my anus feels tight and like it's closing up. I had problems with vaginal dysfunction in the past and even went for pelvic pt many years ago. I haven't been doing my kegal exercises. I just don't understand why my genitals are all shriveled up. It's like my urethra is closing up. I am not able to even stream my urine. I feel like I can not move my bowels."  Pt states that Dr. Emily Filbert faxed labs orders last Wednesday to be drawn in cancer center today. Orders not received. Per patient's husband, he has left msg for the office to fax the orders to our office as soon as possible.

## 2017-03-05 NOTE — Telephone Encounter (Signed)
RN Routed this note to [employer], PA-C. Spoke with Dr. Rogue Bussing- pt can be evaluated in [place] tomorrow at 830am. However, md would like pt to be also evaluated by gyn provider asap. Reveiwed chart, pt has also contacted [facility] today to report her gyn concerns. Per Dr. Mirian Capuchin may use estrace creams as this will not interfere with patient's cancer/cancer r/t treatments.

## 2017-03-05 NOTE — Telephone Encounter (Signed)
Scheduled per Elmo Putt

## 2017-03-05 NOTE — Telephone Encounter (Addendum)
Triage nurse already spoke with patient.

## 2017-03-05 NOTE — Telephone Encounter (Signed)
Continues to have problems with urinary frequency, got worse over the weekend. Please advise

## 2017-03-06 ENCOUNTER — Encounter: Payer: Self-pay | Admitting: Obstetrics and Gynecology

## 2017-03-06 ENCOUNTER — Other Ambulatory Visit: Payer: Self-pay | Admitting: *Deleted

## 2017-03-06 ENCOUNTER — Inpatient Hospital Stay: Payer: 59 | Attending: Internal Medicine | Admitting: Internal Medicine

## 2017-03-06 ENCOUNTER — Ambulatory Visit (INDEPENDENT_AMBULATORY_CARE_PROVIDER_SITE_OTHER): Payer: 59 | Admitting: Obstetrics and Gynecology

## 2017-03-06 VITALS — BP 109/73 | HR 85 | Temp 97.8°F | Resp 16

## 2017-03-06 VITALS — BP 110/64 | HR 92 | Ht 65.0 in | Wt 120.0 lb

## 2017-03-06 DIAGNOSIS — D61818 Other pancytopenia: Secondary | ICD-10-CM

## 2017-03-06 DIAGNOSIS — Z79899 Other long term (current) drug therapy: Secondary | ICD-10-CM

## 2017-03-06 DIAGNOSIS — C801 Malignant (primary) neoplasm, unspecified: Secondary | ICD-10-CM | POA: Diagnosis not present

## 2017-03-06 DIAGNOSIS — E876 Hypokalemia: Secondary | ICD-10-CM

## 2017-03-06 DIAGNOSIS — R300 Dysuria: Secondary | ICD-10-CM | POA: Diagnosis not present

## 2017-03-06 DIAGNOSIS — N952 Postmenopausal atrophic vaginitis: Secondary | ICD-10-CM

## 2017-03-06 DIAGNOSIS — N951 Menopausal and female climacteric states: Secondary | ICD-10-CM

## 2017-03-06 DIAGNOSIS — C778 Secondary and unspecified malignant neoplasm of lymph nodes of multiple regions: Secondary | ICD-10-CM

## 2017-03-06 DIAGNOSIS — N941 Unspecified dyspareunia: Secondary | ICD-10-CM

## 2017-03-06 DIAGNOSIS — R102 Pelvic and perineal pain: Secondary | ICD-10-CM | POA: Diagnosis not present

## 2017-03-06 DIAGNOSIS — R350 Frequency of micturition: Secondary | ICD-10-CM | POA: Diagnosis not present

## 2017-03-06 DIAGNOSIS — C7951 Secondary malignant neoplasm of bone: Secondary | ICD-10-CM

## 2017-03-06 LAB — URINE CULTURE: Culture: <10000 — AB

## 2017-03-06 MED ORDER — ESTRADIOL 0.1 MG/GM VA CREA: 1.0000 | TOPICAL_CREAM | Freq: Every day | VAGINAL | 1 refills | Status: DC

## 2017-03-06 NOTE — Progress Notes (Signed)
Chief Complaint  Patient presents with  . Gynecologic Exam    dryness/pelvic pressure  . Urinary Frequency    Tested UTI 4/6 Neg    HPI:      Sylvia Jones is a 61 y.o. G1P1 who LMP was No LMP recorded. Patient is postmenopausal., presents today for a problem visit.  Sex activity: not sexually active.   She complains of a suprapubic pressure with urinary frequency/urgency and dysuria that started 10 days ago. She saw her oncologist and had a neg urinalysis and urine C&S 3/310/18. Sx have persisted and she had another C&S done yesterday, awaiting results. She started aleve and AZO with some relief. She has a hx of constipation with chemo but BMs do not improve suprapubic pressure sx. She feels like her urinary flow is weak but she doesn't feel like she has urinary retention. She has a pelvic CT scan scheduled later this week.  She has also noted vaginal and rectal dryness. She started estrace crm last summer for vaginal atrophy with some relief sexually. She broke her hip summer 2017 and was then diagnosed with metastatic cancer of unknown primary site. She stopped being sex active and therefore, stopped the vaginal ERT. Her oncologist is fine with her using vag ERT. No vaginal itching, d/c, irritation, odor.  She wonders if her sx are related to pelvic floor relaxation, which she has had in the past. She did PT for her hip 12/17 and was doing pelvic exercises at that time, but has since stopped. She notes diffuse muscle weakness with chemo use.    Review of Systems  Constitutional: Negative for fever.  Gastrointestinal: Negative for blood in stool, constipation, diarrhea, nausea and vomiting.  Genitourinary: Positive for dysuria, frequency, urgency and vaginal pain. Negative for dyspareunia, flank pain, hematuria, vaginal bleeding and vaginal discharge.  Musculoskeletal: Negative for back pain.  Skin: Negative for rash.    Past Medical History:  Diagnosis Date  . Arthritis     . Cancer ([place])    lymph nodes, in process of diagnosing  . Cervical disc disease    upper cervical, denied numbness / tingling BUE  . Cervical stenosis of spine   . Fracture 04/2016   left pubis bone-atypical   . Lymphadenopathy 09/05/2016   R clavical region  . Neuromuscular disorder ([place])    compressed disc in neck when 23 or 61 years old  . Post-menopausal 2011  . Primary cancer of unknown site [facility])   . Tubular adenoma     Family History  Problem Relation Age of Onset  . Cancer Father     medistinal  carcinoma  . Heart attack Father   . Hypertension Brother   . Diabetes Maternal Grandmother   . Diabetes Paternal Grandmother   . Cancer Brother     Capa C sarcoma     OBJECTIVE:   Vitals:  BP 110/64 (BP Location: Left Arm, Patient Position: Sitting, Cuff Size: Normal)   Pulse 92   Ht 5\' 5"  (1.651 m)   Wt 120 lb (54.4 kg)   BMI 19.97 kg/m   Physical Exam  Constitutional: She is oriented to person, place, and time and well-developed, well-nourished, and in no distress. Vital signs are normal.  Genitourinary: Uterus normal, cervix normal, right adnexa normal, left adnexa normal and vulva normal. Rectal exam shows external hemorrhoid. Uterus is not enlarged. Cervix exhibits no motion tenderness and no tenderness. Right adnexum displays no mass and no tenderness. Left adnexum displays no  mass and no tenderness. Vulva exhibits no erythema, no exudate, no lesion, no rash and no tenderness. Vagina exhibits abnormal mucosa. Vagina exhibits no lesion. No vaginal discharge found.  Genitourinary Comments: DECREASED VAGINAL OPENING WITH EVID OF ATROPHIC VAGINOSIS; SMALL VAGINAL CANAL; LESS THAN GRADE 1 CYSTOCELE, NO RECTOCELE  Neurological: She is oriented to person, place, and time.    Assessment/Plan: Vaginal dryness, menopausal - Plan: estradiol (ESTRACE) 0.1 MG/GM vaginal cream  Vaginal atrophy - Resume estrace crm. Rx eRxd/coupon card given to pt. Can use coconut oil in  vulva as moisturizer.  Suprapubic pressure - Neg UA. Awaiting C&S from yesterday with onc. Pt has CT scan later this wk. If neg, question urinary retention vs bladder spasm.   Urinary frequency - Suggested pelvic tilt during voiding to fully empty bladder. Resume pelvic floor exercises although may be futile with chemo. Minimal cystocele, not causative.     Return if symptoms worsen or fail to improve.  Milind Raether B. Jolee Critcher, PA-C 03/06/2017 12:28 PM

## 2017-03-06 NOTE — Assessment & Plan Note (Addendum)
#   Status post excisional lymph node biopsy- positive for carcinoma- question breast primary [based on immunohistochemistry & Cancer type ID]; Currently on Adriamycin-Cytoxan; Feb 2018- CT scan - improved/stable disease; except slight increase in the size of left neck level 2 LN [from 7 mm to 10 mm]. currently status post cycle #4 of Adriamycin Cytoxan approximately 2 weeks ago.  # Today on exam slight increase in the size of the left submandibular lymph node concern for progression. Recommend moving the scans [neck chest abdomen pelvis]- this week.   # Pancytopenia-white count 3.5 ANC 2.5 hemoglobin 8.9 platelets 57 status post Neulasta. From chemotherapy. Monitor for now.   # Discomfort during urination /vaginal discomfort - no obvious lesions noted on exam. Question atrophic vaginitis/urethritis. Recommend Pyridium for symptoms; and also okay from oncology standpoint to use estrogen-based ointments; patient has appointment with gynecology this morning.   #Patient will see me few days after the imaging.

## 2017-03-06 NOTE — Progress Notes (Signed)
Patient presents to clinic today with concerns of dysuria and dyspareunia. States that she is unable to void with/out labia/urethra pain.  Also reports inability to pass bowel movements. States that she 'feels the urge to have a bowel movement, but unable to empty my bowels. I feel like everything is tight in my genitals." pt states that she is using AZO otc for urinary spasms and did not pick up her rx for pyridium. Pt also reports increasing lymph node nodules growing on left lateral neck. Pt voices concerns about disease progression and would like Dr. Rogue Bussing to evaluate these areas of concern.  RN Chaperoned provider with pelvic Exam

## 2017-03-06 NOTE — Progress Notes (Signed)
[place] NOTE  Patient Care Team: Rusty Aus, MD as PCP - General (Internal Medicine)  CHIEF COMPLAINTS/PURPOSE OF CONSULTATION:   Oncology History   # SEP 2017- LYMPHADENOPATHY- extensive- abdominal/RP/ bil neck LN R>L. 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   # Bone mets- X-geva q 6 W  # MOLECULAR TESTING- ER/PR/her 2 NEG; MSI-STABLE. OCT 2017- MRI Bil Breast NEG. PDL-1- NEG [0%]- Found One [NOV 2017]- PTEN **[others]  # Lung RUL nodule [benign s/p resection; 2009- Dr.Oaks]  # spring 2017- colonoscopy [Dr.Elliot; polyps]; June mammo-NEG     Primary cancer of unknown site [facility])   08/25/2016 Initial Diagnosis    Primary cancer of unknown site [facility])       Oncology History   # SEP 2017- LYMPHADENOPATHY- extensive- abdominal/RP/ bil neck LN R>L. 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   # Bone mets- X-geva q 6 W  # MOLECULAR TESTING- ER/PR/her 2 NEG; MSI-STABLE. OCT 2017- MRI Bil Breast NEG. PDL-1- NEG [0%]- Found One [NOV 2017]- PTEN **[others]  # Lung RUL nodule [benign s/p resection; 2009- Dr.Oaks]  # spring 2017- colonoscopy [Dr.Elliot; polyps]; June mammo-NEG     Primary cancer of unknown site [facility])   08/25/2016 Initial Diagnosis    Primary cancer of unknown site [facility])       HISTORY OF PRESENTING ILLNESS:  Sylvia Jones 61 y.o.  female with a history of carcinoma of unknown primary [possible breast cancer based on IHC] Chemotherapy with Adriamycin Cytoxan every 3 weeks status post cycle #4 approximately  2  weeks ago.  Patient has been distress in the last few days because of difficulty urination burning pain with urination. She also has a feeling of rectal pressure. Denies any constipation. She is appointment with gynecology A this morning.  Patient also noticed to have slight increase in the lymph node of the left neck under the jaw. Slightly tender to touch.   Appetite is good. No weight loss. No nausea no vomiting.  Denies any diarrhea. No cough or shortness of breath. Denies any significant pain. Not any pain pills.  ROS: A complete 10 point review of system is done which is negative except mentioned above in history of present illness  MEDICAL HISTORY:  Past Medical History:  Diagnosis Date  . Arthritis   . Cancer ([place])    lymph nodes, in process of diagnosing  . Cervical disc disease    upper cervical, denied numbness / tingling BUE  . Cervical stenosis of spine   . Fracture 04/2016   left pubis bone-atypical   . Lymphadenopathy 09/05/2016   R clavical region  . Neuromuscular disorder ([place])    compressed disc in neck when 76 or 61 years old  . Post-menopausal 2011  . Primary cancer of unknown site [facility])   . Tubular adenoma     SURGICAL HISTORY: Past Surgical History:  Procedure Laterality Date  . APPENDECTOMY  1986  . BREAST BIOPSY Right    at age 37  . CESAREAN SECTION  1988  . CESAREAN SECTION  1988  . COLONOSCOPY WITH  PROPOFOL N/A 07/12/2015   Procedure: COLONOSCOPY WITH PROPOFOL;  Surgeon: Manya Silvas, MD;  Location[facility] ENDOSCOPY;  Service: Endoscopy;  Laterality: N/A;  . EXPLORATORY LAPAROTOMY  1986  . LYMPH GLAND EXCISION    . MASS BIOPSY N/A 09/05/2016   Procedure: NECK MASS BIOPSY;  Surgeon: Beverly Gust, MD;  Location: [facility] ORS;  Service: ENT;  Laterality: N/A;  . MASS EXCISION  1986   to remove mass outside the uterus. procedure was combined tacked uterus, reposition of fallopian tubes   . PORTACATH PLACEMENT Left 09/20/2016   Procedure: INSERTION  PORT-A-CATH;  Surgeon: Olean Ree, MD;  Location: [facility] ORS;  Service: General;  Laterality: Left;  . THORACOTOMY Right 03/2008   RLL Wedge Resection- Dr. Genevive Bi    SOCIAL HISTORY: lives in [place]; NO smoking/ alcohol- stay home/home schooled son; son-PA in [employer] Social History   Social History  . Marital status: Married    Spouse name: N/A  . Number of children: N/A  . Years of education: N/A   Occupational History  . Not on file.   Social History Main Topics  . Smoking status: Never Smoker  . Smokeless tobacco: Never Used  . Alcohol use No  . Drug use: No  . Sexual activity: Yes   Other Topics Concern  . Not on file   Social History Narrative  . No narrative on file    FAMILY HISTORY: father- ? Cancer died 31. .. Brother- kasposi sarcoma Family History  Problem Relation Age of Onset  . Cancer Father     medistinal  carcinoma  . Heart attack Father   . Hypertension Brother   . Diabetes Maternal Grandmother   . Diabetes Paternal Grandmother   . Cancer Brother     Capa C sarcoma    ALLERGIES:  is allergic to sulfa antibiotics.  MEDICATIONS:  Current Outpatient Prescriptions  Medication Sig Dispense Refill  . acetaminophen (TYLENOL) 500 MG tablet Take 1,000 mg by mouth daily as needed for moderate pain.    . Calcium Carb-Cholecalciferol (CALCIUM 600+D3 PO) Take 1 Dose by mouth daily.     . cyclobenzaprine (FLEXERIL) 10 MG tablet Take 10 mg by mouth 3 (three) times daily as needed for muscle spasms.    Marland Kitchen lidocaine-prilocaine (EMLA) cream Apply 1 application topically as needed. Apply generously over the Mediport 45 minutes prior to chemotherapy. 30 g 0  . naproxen sodium (ANAPROX) 220 MG tablet Take 220 mg by mouth as needed. As needed for pain    . ondansetron (ZOFRAN) 8 MG tablet Take 8 mg by mouth every 8 (eight) hours as needed for nausea or vomiting.    Marland Kitchen Phenazopyridine HCl (AZO TABS PO) Take 1 tablet by mouth as needed (bladder spasms).    .  polyethylene glycol (MIRALAX / GLYCOLAX) packet Take 17 g by mouth daily.    . potassium chloride SA (K-DUR,KLOR-CON) 20 MEQ tablet 1 pill twice a day x1 week. 30 tablet 3  . prochlorperazine (COMPAZINE) 10 MG tablet Take 10 mg by mouth every 6 (six) hours as needed for nausea or vomiting.    . Sennosides (SENNA LAX PO) Take 1 Dose by mouth daily as needed.     Marland Kitchen estradiol (ESTRACE) 0.1 MG/GM vaginal cream Place 1 Applicatorful vaginally at bedtime. Insert 1g nightly for 1 wk, then 1 g once weekly as maintenace 42.5 g 1  . HYDROcodone-acetaminophen (NORCO/VICODIN) 5-325 MG tablet Take 1 tablet by mouth every 6 (six) hours as needed for moderate  pain.    . lactulose (CEPHULAC) 10 g packet Take 10 grams three times daily for the next 2 days. Then 10 grams daily, can increase to 10 grams three times a day for future concerns for worsening constipation (Patient not taking: Reported on 01/10/2017) 30 each 0  . phenazopyridine (PYRIDIUM) 100 MG tablet Take 1 tablet (100 mg total) by mouth 3 (three) times daily as needed for pain (bladder spasms). (Patient not taking: Reported on 03/06/2017) 21 tablet 0   No current facility-administered medications for this visit.    Facility-Administered Medications Ordered in Other Visits  Medication Dose Route Frequency Provider Last Rate Last Dose  . heparin lock flush 100 unit/mL  500 Units Intravenous Once Cammie Sickle, MD          .  PHYSICAL EXAMINATION: ECOG PERFORMANCE STATUS: 1 - Symptomatic but completely ambulatory  Vitals:   03/06/17 0830  BP: 109/73  Pulse: 85  Resp: 16  Temp: 97.8 F (36.6 C)   There were no vitals filed for this visit.  GENERAL: Well-nourished well-developed; Alert, no distress and comfortable.   With husband. Walking herself. EYES: no pallor or icterus OROPHARYNX: no thrush or ulceration; good dentition  NECK: supple, no masses felt LYMPH:  Approximately 2 cm lymph node felt in the left submandibular region  [slightly worse compared to last visit 2 weeks ago] LUNGS: clear to auscultation and  No wheeze or crackles HEART/CVS: regular rate & rhythm and no murmurs; No lower extremity edema. ABDOMEN: abdomen soft, non-tender and normal bowel sounds Musculoskeletal:no cyanosis of digits and no clubbing  PSYCH: alert & oriented x 3 with fluent speech NEURO: no focal motor/sensory deficits SKIN:  no rashes or significant lesions.  Exam of the genitalia- no obvious lesions noted.    LABORATORY DATA:  I have reviewed the data as listed Lab Results  Component Value Date   WBC 3.5 (L) 03/05/2017   HGB 8.9 (L) 03/05/2017   HCT 25.4 (L) 03/05/2017   MCV 94.7 03/05/2017   PLT 57 (L) 03/05/2017    Recent Labs  02/09/17 1018 02/21/17 1001 03/05/17 1037  NA 138 136 138  K 2.8* 3.9 3.9  CL 101 104 104  CO2 _0 GLUCOSE 69 98 97  BUN _1 CREATININE 0.40* 0.37* 0.35*  CALCIUM 8.9 8.8* 9.0  GFRNONAA >60 >60 >60  GFRAA >60 >60 >60  PROT 7.1 7.0 6.9  ALBUMIN 4.3 3.9 4.1  AST _2 ALT _3 ALKPHOS 113 90 128*  BILITOT 0.4 0.4 0.4    RADIOGRAPHIC STUDIES: I have personally reviewed the radiological images as listed and agreed with the findings in the report. No results found.  ASSESSMENT & PLAN:   Primary cancer of unknown site [facility]) # Status post excisional lymph node biopsy- positive for carcinoma- question breast primary [based on immunohistochemistry & Cancer type ID]; Currently on Adriamycin-Cytoxan; Feb 2018- CT scan - improved/stable disease; except slight increase in the size of left neck level 2 LN [from 7 mm to 10 mm]. currently status post cycle #4 of Adriamycin Cytoxan approximately 2 weeks ago.  # Today on exam slight increase in the size of the left submandibular lymph node concern for progression. Recommend moving the scans [neck chest abdomen pelvis]- this week.   # Pancytopenia-white count 3.5 ANC 2.5 hemoglobin 8.9 platelets 57 status post Neulasta. From  chemotherapy. Monitor for now.   # Discomfort during urination /vaginal discomfort -  no obvious lesions noted on exam. Question atrophic vaginitis/urethritis. Recommend Pyridium for symptoms; and also okay from oncology standpoint to use estrogen-based ointments; patient has appointment with gynecology this morning.   #Patient will see me few days after the imaging.     Cammie Sickle, MD 03/06/2017 2:08 PM

## 2017-03-08 ENCOUNTER — Encounter: Payer: Self-pay | Admitting: Internal Medicine

## 2017-03-09 ENCOUNTER — Inpatient Hospital Stay: Payer: 59

## 2017-03-09 ENCOUNTER — Ambulatory Visit
Admission: RE | Admit: 2017-03-09 | Discharge: 2017-03-09 | Disposition: A | Discharge status: Home / Self Care | Payer: 59 | Source: Ambulatory Visit | Attending: Internal Medicine | Admitting: Internal Medicine

## 2017-03-09 DIAGNOSIS — E876 Hypokalemia: Secondary | ICD-10-CM | POA: Diagnosis not present

## 2017-03-09 DIAGNOSIS — R590 Localized enlarged lymph nodes: Secondary | ICD-10-CM | POA: Insufficient documentation

## 2017-03-09 DIAGNOSIS — C7951 Secondary malignant neoplasm of bone: Secondary | ICD-10-CM | POA: Diagnosis not present

## 2017-03-09 DIAGNOSIS — N1330 Unspecified hydronephrosis: Secondary | ICD-10-CM | POA: Insufficient documentation

## 2017-03-09 DIAGNOSIS — R300 Dysuria: Secondary | ICD-10-CM | POA: Diagnosis not present

## 2017-03-09 DIAGNOSIS — D61818 Other pancytopenia: Secondary | ICD-10-CM | POA: Diagnosis not present

## 2017-03-09 DIAGNOSIS — C801 Malignant (primary) neoplasm, unspecified: Secondary | ICD-10-CM | POA: Diagnosis not present

## 2017-03-09 DIAGNOSIS — Z79899 Other long term (current) drug therapy: Secondary | ICD-10-CM | POA: Diagnosis not present

## 2017-03-09 DIAGNOSIS — Z95828 Presence of other vascular implants and grafts: Secondary | ICD-10-CM

## 2017-03-09 DIAGNOSIS — C778 Secondary and unspecified malignant neoplasm of lymph nodes of multiple regions: Secondary | ICD-10-CM | POA: Diagnosis not present

## 2017-03-09 DIAGNOSIS — K8020 Calculus of gallbladder without cholecystitis without obstruction: Secondary | ICD-10-CM | POA: Diagnosis not present

## 2017-03-09 DIAGNOSIS — N134 Hydroureter: Secondary | ICD-10-CM | POA: Diagnosis not present

## 2017-03-09 MED ORDER — HEPARIN SOD (PORK) LOCK FLUSH 100 UNIT/ML IV SOLN
500.0000 [IU] | Freq: Once | INTRAVENOUS | Status: AC
  Administered 2017-03-09: 500 [IU] via INTRAVENOUS

## 2017-03-09 MED ORDER — IOPAMIDOL (ISOVUE-300) INJECTION 61%
100.0000 mL | Freq: Once | INTRAVENOUS | Status: AC | PRN
  Administered 2017-03-09: 100 mL via INTRAVENOUS

## 2017-03-09 MED ORDER — IOPAMIDOL (ISOVUE-300) INJECTION 61%: 100.0000 mL | Freq: Once | INTRAVENOUS | Status: DC | PRN

## 2017-03-09 MED ORDER — SODIUM CHLORIDE 0.9% FLUSH
10.0000 mL | INTRAVENOUS | Status: DC | PRN
  Administered 2017-03-09: 10 mL via INTRAVENOUS
  Filled 2017-03-09: qty 10

## 2017-03-12 ENCOUNTER — Other Ambulatory Visit: Payer: Self-pay | Admitting: *Deleted

## 2017-03-12 ENCOUNTER — Telehealth: Payer: Self-pay | Admitting: Internal Medicine

## 2017-03-12 ENCOUNTER — Encounter: Payer: Self-pay | Admitting: Internal Medicine

## 2017-03-12 MED ORDER — TRAMADOL HCL 50 MG PO TABS: 50.0000 mg | ORAL_TABLET | Freq: Four times a day (QID) | ORAL | 0 refills | Status: DC | PRN

## 2017-03-12 NOTE — Telephone Encounter (Signed)
Spoke to pt re: CT scans; recommend increased fluid intake; continue antibiotic; pyridium. We'll speak to Dr. Erlene Quan. Will make a referral to Dr.Brandon.

## 2017-03-13 ENCOUNTER — Other Ambulatory Visit: Payer: Self-pay | Admitting: *Deleted

## 2017-03-13 ENCOUNTER — Telehealth: Payer: Self-pay | Admitting: Internal Medicine

## 2017-03-13 ENCOUNTER — Telehealth: Payer: Self-pay | Admitting: Urology

## 2017-03-13 ENCOUNTER — Encounter: Payer: Self-pay | Admitting: Internal Medicine

## 2017-03-13 ENCOUNTER — Encounter: Payer: Self-pay | Admitting: *Deleted

## 2017-03-13 DIAGNOSIS — R300 Dysuria: Secondary | ICD-10-CM

## 2017-03-13 DIAGNOSIS — C801 Malignant (primary) neoplasm, unspecified: Secondary | ICD-10-CM

## 2017-03-13 NOTE — Telephone Encounter (Signed)
-----   Message from Hollice Espy, MD sent at 03/12/2017  5:07 PM EDT ----- Regarding: Add on to my Thursday schedule Please add this patient to Thursday at 11:15.  I was called by the cancer center to see her personally.  She may need a cysto.    Hollice Espy, MD

## 2017-03-13 NOTE — Telephone Encounter (Signed)
Pt is scheduled to see Dr Rogue Bussing tomorrow afternoon

## 2017-03-13 NOTE — Telephone Encounter (Signed)
done

## 2017-03-13 NOTE — Telephone Encounter (Signed)
Spoke to [place] yesterday; kindly agreed to evaluate the patient at the earliest.

## 2017-03-14 ENCOUNTER — Emergency Department: Payer: 59

## 2017-03-14 ENCOUNTER — Encounter: Payer: Self-pay | Admitting: *Deleted

## 2017-03-14 ENCOUNTER — Inpatient Hospital Stay: Payer: 59 | Admitting: Internal Medicine

## 2017-03-14 ENCOUNTER — Inpatient Hospital Stay: Payer: 59

## 2017-03-14 ENCOUNTER — Inpatient Hospital Stay
Admission: EM | Admit: 2017-03-14 | Discharge: 2017-03-17 | DRG: 669 | Disposition: A | Discharge status: Other Acute Inpatient Hospital | Payer: 59 | Attending: Internal Medicine | Admitting: Internal Medicine

## 2017-03-14 ENCOUNTER — Encounter: Payer: Self-pay | Admitting: Internal Medicine

## 2017-03-14 ENCOUNTER — Other Ambulatory Visit: Payer: Self-pay

## 2017-03-14 ENCOUNTER — Telehealth: Payer: Self-pay | Admitting: *Deleted

## 2017-03-14 DIAGNOSIS — Q6211 Congenital occlusion of ureteropelvic junction: Secondary | ICD-10-CM

## 2017-03-14 DIAGNOSIS — D494 Neoplasm of unspecified behavior of bladder: Secondary | ICD-10-CM | POA: Diagnosis not present

## 2017-03-14 DIAGNOSIS — C799 Secondary malignant neoplasm of unspecified site: Secondary | ICD-10-CM | POA: Diagnosis not present

## 2017-03-14 DIAGNOSIS — Z809 Family history of malignant neoplasm, unspecified: Secondary | ICD-10-CM | POA: Diagnosis not present

## 2017-03-14 DIAGNOSIS — K56600 Partial intestinal obstruction, unspecified as to cause: Secondary | ICD-10-CM | POA: Diagnosis not present

## 2017-03-14 DIAGNOSIS — K922 Gastrointestinal hemorrhage, unspecified: Secondary | ICD-10-CM | POA: Diagnosis present

## 2017-03-14 DIAGNOSIS — C7911 Secondary malignant neoplasm of bladder: Secondary | ICD-10-CM | POA: Diagnosis not present

## 2017-03-14 DIAGNOSIS — E876 Hypokalemia: Secondary | ICD-10-CM | POA: Diagnosis present

## 2017-03-14 DIAGNOSIS — K56609 Unspecified intestinal obstruction, unspecified as to partial versus complete obstruction: Secondary | ICD-10-CM | POA: Diagnosis not present

## 2017-03-14 DIAGNOSIS — R112 Nausea with vomiting, unspecified: Secondary | ICD-10-CM | POA: Diagnosis not present

## 2017-03-14 DIAGNOSIS — E43 Unspecified severe protein-calorie malnutrition: Secondary | ICD-10-CM | POA: Diagnosis not present

## 2017-03-14 DIAGNOSIS — K5900 Constipation, unspecified: Secondary | ICD-10-CM

## 2017-03-14 DIAGNOSIS — N135 Crossing vessel and stricture of ureter without hydronephrosis: Secondary | ICD-10-CM | POA: Diagnosis not present

## 2017-03-14 DIAGNOSIS — G893 Neoplasm related pain (acute) (chronic): Secondary | ICD-10-CM | POA: Diagnosis not present

## 2017-03-14 DIAGNOSIS — R591 Generalized enlarged lymph nodes: Secondary | ICD-10-CM | POA: Diagnosis present

## 2017-03-14 DIAGNOSIS — Z881 Allergy status to other antibiotic agents status: Secondary | ICD-10-CM | POA: Diagnosis not present

## 2017-03-14 DIAGNOSIS — D638 Anemia in other chronic diseases classified elsewhere: Secondary | ICD-10-CM | POA: Diagnosis present

## 2017-03-14 DIAGNOSIS — C801 Malignant (primary) neoplasm, unspecified: Secondary | ICD-10-CM | POA: Diagnosis present

## 2017-03-14 DIAGNOSIS — N132 Hydronephrosis with renal and ureteral calculous obstruction: Secondary | ICD-10-CM | POA: Diagnosis not present

## 2017-03-14 DIAGNOSIS — J9811 Atelectasis: Secondary | ICD-10-CM | POA: Diagnosis not present

## 2017-03-14 DIAGNOSIS — Z7401 Bed confinement status: Secondary | ICD-10-CM | POA: Diagnosis not present

## 2017-03-14 DIAGNOSIS — L600 Ingrowing nail: Secondary | ICD-10-CM | POA: Diagnosis present

## 2017-03-14 DIAGNOSIS — R1011 Right upper quadrant pain: Secondary | ICD-10-CM | POA: Diagnosis not present

## 2017-03-14 DIAGNOSIS — N390 Urinary tract infection, site not specified: Secondary | ICD-10-CM | POA: Diagnosis present

## 2017-03-14 DIAGNOSIS — Z4682 Encounter for fitting and adjustment of non-vascular catheter: Secondary | ICD-10-CM | POA: Diagnosis not present

## 2017-03-14 DIAGNOSIS — R109 Unspecified abdominal pain: Secondary | ICD-10-CM | POA: Diagnosis not present

## 2017-03-14 DIAGNOSIS — N1330 Unspecified hydronephrosis: Secondary | ICD-10-CM

## 2017-03-14 DIAGNOSIS — N179 Acute kidney failure, unspecified: Secondary | ICD-10-CM | POA: Diagnosis not present

## 2017-03-14 DIAGNOSIS — K56699 Other intestinal obstruction unspecified as to partial versus complete obstruction: Secondary | ICD-10-CM | POA: Diagnosis not present

## 2017-03-14 DIAGNOSIS — N131 Hydronephrosis with ureteral stricture, not elsewhere classified: Secondary | ICD-10-CM | POA: Diagnosis present

## 2017-03-14 DIAGNOSIS — Z681 Body mass index (BMI) 19 or less, adult: Secondary | ICD-10-CM | POA: Diagnosis not present

## 2017-03-14 DIAGNOSIS — C784 Secondary malignant neoplasm of small intestine: Secondary | ICD-10-CM | POA: Diagnosis not present

## 2017-03-14 DIAGNOSIS — R590 Localized enlarged lymph nodes: Secondary | ICD-10-CM | POA: Diagnosis not present

## 2017-03-14 DIAGNOSIS — Z8249 Family history of ischemic heart disease and other diseases of the circulatory system: Secondary | ICD-10-CM | POA: Diagnosis not present

## 2017-03-14 DIAGNOSIS — R140 Abdominal distension (gaseous): Secondary | ICD-10-CM

## 2017-03-14 DIAGNOSIS — Z9221 Personal history of antineoplastic chemotherapy: Secondary | ICD-10-CM

## 2017-03-14 DIAGNOSIS — N201 Calculus of ureter: Secondary | ICD-10-CM | POA: Diagnosis not present

## 2017-03-14 DIAGNOSIS — K219 Gastro-esophageal reflux disease without esophagitis: Secondary | ICD-10-CM | POA: Diagnosis not present

## 2017-03-14 DIAGNOSIS — D62 Acute posthemorrhagic anemia: Secondary | ICD-10-CM | POA: Diagnosis not present

## 2017-03-14 DIAGNOSIS — C7951 Secondary malignant neoplasm of bone: Secondary | ICD-10-CM

## 2017-03-14 DIAGNOSIS — Z466 Encounter for fitting and adjustment of urinary device: Secondary | ICD-10-CM | POA: Diagnosis not present

## 2017-03-14 DIAGNOSIS — R0602 Shortness of breath: Secondary | ICD-10-CM | POA: Diagnosis not present

## 2017-03-14 DIAGNOSIS — K529 Noninfective gastroenteritis and colitis, unspecified: Secondary | ICD-10-CM | POA: Diagnosis present

## 2017-03-14 DIAGNOSIS — E440 Moderate protein-calorie malnutrition: Secondary | ICD-10-CM | POA: Diagnosis not present

## 2017-03-14 DIAGNOSIS — D649 Anemia, unspecified: Secondary | ICD-10-CM | POA: Diagnosis not present

## 2017-03-14 DIAGNOSIS — C778 Secondary and unspecified malignant neoplasm of lymph nodes of multiple regions: Secondary | ICD-10-CM | POA: Diagnosis not present

## 2017-03-14 DIAGNOSIS — K5669 Other partial intestinal obstruction: Secondary | ICD-10-CM | POA: Diagnosis not present

## 2017-03-14 DIAGNOSIS — K37 Unspecified appendicitis: Secondary | ICD-10-CM | POA: Diagnosis not present

## 2017-03-14 DIAGNOSIS — Z79899 Other long term (current) drug therapy: Secondary | ICD-10-CM | POA: Diagnosis not present

## 2017-03-14 LAB — CBC WITH DIFFERENTIAL/PLATELET
BASOS PCT: 0 %
Basophils Absolute: 0 10*3/uL (ref 0–0.1)
Eosinophils Absolute: 0 10*3/uL (ref 0–0.7)
Eosinophils Relative: 0 %
HEMATOCRIT: 28.9 % — AB (ref 35.0–47.0)
Hemoglobin: 9.9 g/dL — ABNORMAL LOW (ref 12.0–16.0)
LYMPHS PCT: 2 %
Lymphs Abs: 0.2 10*3/uL — ABNORMAL LOW (ref 1.0–3.6)
MCH: 32.7 pg (ref 26.0–34.0)
MCHC: 34.3 g/dL (ref 32.0–36.0)
MCV: 95.5 fL (ref 80.0–100.0)
MONO ABS: 0.9 10*3/uL (ref 0.2–0.9)
Monocytes Relative: 8 %
NEUTROS ABS: 10 10*3/uL — AB (ref 1.4–6.5)
NEUTROS PCT: 90 %
Platelets: 277 10*3/uL (ref 150–440)
RBC: 3.02 MIL/uL — ABNORMAL LOW (ref 3.80–5.20)
RDW: 17.3 % — AB (ref 11.5–14.5)
WBC: 11.2 10*3/uL — ABNORMAL HIGH (ref 3.6–11.0)

## 2017-03-14 LAB — HEPATIC FUNCTION PANEL
ALBUMIN: 4 g/dL (ref 3.5–5.0)
ALT: 24 U/L (ref 14–54)
AST: 33 U/L (ref 15–41)
Alkaline Phosphatase: 120 U/L (ref 38–126)
BILIRUBIN TOTAL: 0.7 mg/dL (ref 0.3–1.2)
Bilirubin, Direct: 0.1 mg/dL (ref 0.1–0.5)
Indirect Bilirubin: 0.6 mg/dL (ref 0.3–0.9)
Total Protein: 7 g/dL (ref 6.5–8.1)

## 2017-03-14 LAB — BASIC METABOLIC PANEL
ANION GAP: 11 (ref 5–15)
BUN: 18 mg/dL (ref 6–20)
CALCIUM: 9.6 mg/dL (ref 8.9–10.3)
CO2: 30 mmol/L (ref 22–32)
Chloride: 91 mmol/L — ABNORMAL LOW (ref 101–111)
Creatinine, Ser: 0.82 mg/dL (ref 0.44–1.00)
Glucose, Bld: 145 mg/dL — ABNORMAL HIGH (ref 65–99)
POTASSIUM: 3.3 mmol/L — AB (ref 3.5–5.1)
Sodium: 132 mmol/L — ABNORMAL LOW (ref 135–145)

## 2017-03-14 LAB — LIPASE, BLOOD: Lipase: 16 U/L (ref 11–51)

## 2017-03-14 LAB — URINALYSIS, COMPLETE (UACMP) WITH MICROSCOPIC
Bacteria, UA: NONE SEEN
Bilirubin Urine: NEGATIVE
GLUCOSE, UA: NEGATIVE mg/dL
HGB URINE DIPSTICK: NEGATIVE
Ketones, ur: NEGATIVE mg/dL
LEUKOCYTES UA: NEGATIVE
NITRITE: POSITIVE — AB
Protein, ur: NEGATIVE mg/dL
SPECIFIC GRAVITY, URINE: 1.035 — AB (ref 1.005–1.030)
pH: 5 (ref 5.0–8.0)

## 2017-03-14 LAB — TROPONIN I: TROPONIN I: 0.04 ng/mL — AB (ref <0.03)

## 2017-03-14 LAB — LACTIC ACID, PLASMA: Lactic Acid, Venous: 1.2 mmol/L (ref 0.5–1.9)

## 2017-03-14 MED ORDER — CIPROFLOXACIN IN D5W 400 MG/200ML IV SOLN
400.0000 mg | Freq: Once | INTRAVENOUS | Status: AC
  Administered 2017-03-14: 400 mg via INTRAVENOUS
  Filled 2017-03-14: qty 200

## 2017-03-14 MED ORDER — LIDOCAINE HCL 2 % IJ SOLN
20.0000 mL | Freq: Once | INTRAMUSCULAR | Status: AC
  Administered 2017-03-14: 100 mg
  Filled 2017-03-14: qty 20

## 2017-03-14 MED ORDER — IOPAMIDOL (ISOVUE-300) INJECTION 61%
75.0000 mL | Freq: Once | INTRAVENOUS | Status: AC | PRN
  Administered 2017-03-14: 75 mL via INTRAVENOUS

## 2017-03-14 MED ORDER — PIPERACILLIN-TAZOBACTAM 3.375 G IVPB
3.3750 g | Freq: Three times a day (TID) | INTRAVENOUS | Status: DC
  Administered 2017-03-14 – 2017-03-16 (×4): 3.375 g via INTRAVENOUS
  Filled 2017-03-14 (×9): qty 50

## 2017-03-14 MED ORDER — PROMETHAZINE HCL 25 MG/ML IJ SOLN
12.5000 mg | Freq: Once | INTRAMUSCULAR | Status: AC
  Administered 2017-03-14: 12.5 mg via INTRAVENOUS

## 2017-03-14 MED ORDER — ACETAMINOPHEN 650 MG RE SUPP: 650.0000 mg | Freq: Four times a day (QID) | RECTAL | Status: DC | PRN

## 2017-03-14 MED ORDER — SODIUM CHLORIDE 0.9 % IV BOLUS (SEPSIS)
1000.0000 mL | Freq: Once | INTRAVENOUS | Status: AC
  Administered 2017-03-14: 1000 mL via INTRAVENOUS

## 2017-03-14 MED ORDER — CIPROFLOXACIN IN D5W 400 MG/200ML IV SOLN
400.0000 mg | Freq: Two times a day (BID) | INTRAVENOUS | Status: DC
  Filled 2017-03-14: qty 200

## 2017-03-14 MED ORDER — ONDANSETRON HCL 4 MG/2ML IJ SOLN: 4.0000 mg | Freq: Four times a day (QID) | INTRAMUSCULAR | Status: DC | PRN

## 2017-03-14 MED ORDER — MORPHINE SULFATE (PF) 2 MG/ML IV SOLN
2.0000 mg | INTRAVENOUS | Status: DC | PRN
  Administered 2017-03-15 – 2017-03-16 (×4): 2 mg via INTRAVENOUS
  Filled 2017-03-14 (×4): qty 1

## 2017-03-14 MED ORDER — PHENOL 1.4 % MT LIQD
1.0000 | OROMUCOSAL | Status: DC | PRN
  Administered 2017-03-14: 1 via OROMUCOSAL
  Filled 2017-03-14 (×2): qty 177

## 2017-03-14 MED ORDER — FENTANYL CITRATE (PF) 100 MCG/2ML IJ SOLN
100.0000 ug | INTRAMUSCULAR | Status: DC | PRN
  Administered 2017-03-14 (×2): 100 ug via INTRAVENOUS
  Filled 2017-03-14 (×2): qty 2

## 2017-03-14 MED ORDER — METRONIDAZOLE IN NACL 5-0.79 MG/ML-% IV SOLN
500.0000 mg | Freq: Once | INTRAVENOUS | Status: AC
  Administered 2017-03-14: 500 mg via INTRAVENOUS
  Filled 2017-03-14: qty 100

## 2017-03-14 MED ORDER — ENOXAPARIN SODIUM 40 MG/0.4ML ~~LOC~~ SOLN
40.0000 mg | SUBCUTANEOUS | Status: DC
  Administered 2017-03-15: 40 mg via SUBCUTANEOUS
  Filled 2017-03-14 (×2): qty 0.4

## 2017-03-14 MED ORDER — PROMETHAZINE HCL 25 MG/ML IJ SOLN
INTRAMUSCULAR | Status: AC
  Filled 2017-03-14: qty 1

## 2017-03-14 MED ORDER — POTASSIUM CHLORIDE IN NACL 20-0.9 MEQ/L-% IV SOLN
INTRAVENOUS | Status: DC
  Administered 2017-03-14 – 2017-03-16 (×4): via INTRAVENOUS
  Filled 2017-03-14 (×9): qty 1000

## 2017-03-14 MED ORDER — PENTAFLUOROPROP-TETRAFLUOROETH EX AERO
INHALATION_SPRAY | CUTANEOUS | Status: AC
  Filled 2017-03-14: qty 30

## 2017-03-14 MED ORDER — PROMETHAZINE HCL 25 MG/ML IJ SOLN
12.5000 mg | Freq: Once | INTRAMUSCULAR | Status: AC
  Administered 2017-03-14: 12.5 mg via INTRAVENOUS
  Filled 2017-03-14: qty 1

## 2017-03-14 MED ORDER — ACETAMINOPHEN 325 MG PO TABS: 650.0000 mg | ORAL_TABLET | Freq: Four times a day (QID) | ORAL | Status: DC | PRN

## 2017-03-14 MED ORDER — ONDANSETRON HCL 4 MG PO TABS: 4.0000 mg | ORAL_TABLET | Freq: Four times a day (QID) | ORAL | Status: DC | PRN

## 2017-03-14 MED ORDER — METRONIDAZOLE IN NACL 5-0.79 MG/ML-% IV SOLN
500.0000 mg | Freq: Three times a day (TID) | INTRAVENOUS | Status: DC
  Filled 2017-03-14 (×2): qty 100

## 2017-03-14 NOTE — ED Notes (Signed)
Good blood return noted from port access prior to medication administration.

## 2017-03-14 NOTE — Progress Notes (Signed)
Pharmacy Antibiotic Note  Sylvia Jones is a 61 y.o. female admitted on 03/14/2017 with Intra-abdominal infection.   Pharmacy has been consulted for  dosing. Patient received 1 dose Cipro and Flagyl in ED.   Plan: Will start Zosyn 3.375 IV EI every 8 hours.   Height: 5\' 5"  (165.1 cm) Weight: 119 lb 1.6 oz (54 kg) IBW/kg (Calculated) : 57  Temp (24hrs), Avg:97.9 F (36.6 C), Min:97.7 F (36.5 C), Max:98.1 F (36.7 C)   Recent Labs Lab 03/14/17 1030  WBC 11.2*  CREATININE 0.82  LATICACIDVEN 1.2    Estimated Creatinine Clearance: 62.2 mL/min (by C-G formula based on SCr of 0.82 mg/dL).    Allergies  Allergen Reactions  . Sulfa Antibiotics Hives    Antimicrobials this admission: 4/18 Cipro/Flagyl>> x1 dose 4/18 Zosyn>>  Dose adjustments this admission:   Microbiology results:   Thank you for allowing pharmacy to be a part of this patient's care.  Pernell Dupre, PharmD, BCPS Clinical Pharmacist 03/14/2017 6:29 PM

## 2017-03-14 NOTE — Consult Note (Addendum)
Date of Consultation:  03/14/2017  Requesting Physician:  Merlyn Lot, MD  Reason for Consultation:  Small bowel obstruction  History of Present Illness: Sylvia Jones is a 61 y.o. female who presents with several days of abdominal pain, nausea, and vomiting.  The patient has a history of metastatic cancer of unknown primary (though favoring breast), currently undergoing chemotherapy with Dr. Rogue Bussing.  She had a CT scan on 3/5 which showed new wall thickening of the ileocecal valve and cecum.  This was persistent on repeat CT scan on 4/13.  Since then she's had the worsening pain and nausea and vomiting.  She tried antiemetic medication but did not help much with her symptoms.  Unable to tolerate much po intake.  Denies any fevers at home.  Denies any chest pain or shortness of breath.  Denies any diarrhea.  Had a bowel movement this morning which was smaller than usual and it was even smaller yesterday.  No flatus since yesterday.  Also reports having dysuria recently and was going to undergo a cystoscopy with Dr. Erlene Quan tomorrow.  She tested negative for UTI recently but was started on Cipro prophylactically.  Past Medical History: Past Medical History:  Diagnosis Date  . Arthritis   . Cancer ([place])    lymph nodes, in process of diagnosing  . Cervical disc disease    upper cervical, denied numbness / tingling BUE  . Cervical stenosis of spine   . Fracture 04/2016   left pubis bone-atypical   . Lymphadenopathy 09/05/2016   R clavical region  . Neuromuscular disorder ([place])    compressed disc in neck when 72 or 61 years old  . Post-menopausal 2011  . Primary cancer of unknown site [facility])   . Tubular adenoma      Past Surgical History: Past Surgical History:  Procedure Laterality Date  . APPENDECTOMY  1986  . BREAST BIOPSY Right    at age 11  . CESAREAN SECTION  1988  . CESAREAN SECTION  1988  . COLONOSCOPY WITH PROPOFOL N/A 07/12/2015   Procedure: COLONOSCOPY WITH  PROPOFOL;  Surgeon: Manya Silvas, MD;  Location: [facility];  Service: Endoscopy;  Laterality: N/A;  . EXPLORATORY LAPAROTOMY  1986  . LYMPH GLAND EXCISION    . MASS BIOPSY N/A 09/05/2016   Procedure: NECK MASS BIOPSY;  Surgeon: Beverly Gust, MD;  Location: [facility] ORS;  Service: ENT;  Laterality: N/A;  . MASS EXCISION  1986   to remove mass outside the uterus. procedure was combined tacked uterus, reposition of fallopian tubes   . PORTACATH PLACEMENT Left 09/20/2016   Procedure: INSERTION PORT-A-CATH;  Surgeon: Olean Ree, MD;  Location: [facility] ORS;  Service: General;  Laterality: Left;  . THORACOTOMY Right 03/2008   RLL Wedge Resection- Dr. Genevive Bi    Home Medications: Prior to Admission medications   Medication Sig Start Date End Date Taking? Authorizing Provider  acetaminophen (TYLENOL) 500 MG tablet Take 1,000 mg by mouth daily as needed for moderate pain.   Yes Historical Provider, MD  Calcium Carb-Cholecalciferol (CALCIUM 600+D3 PO) Take 1 Dose by mouth daily.    Yes Historical Provider, MD  ciprofloxacin (CIPRO) 500 MG tablet Take 500 mg by mouth 2 (two) times daily. 03/09/17 03/15/17 Yes Historical Provider, MD  gabapentin (NEURONTIN) 100 MG capsule Take 100 mg by mouth 3 (three) times daily. 03/12/17 03/12/18 Yes Historical Provider, MD  lactulose (CEPHULAC) 10 g packet Take 10 grams three times daily for the next 2 days. Then 10 grams  daily, can increase to 10 grams three times a day for future concerns for worsening constipation 09/12/16  Yes Nance Pear, MD  lidocaine-prilocaine (EMLA) cream Apply 1 application topically as needed. Apply generously over the Mediport 45 minutes prior to chemotherapy. 01/31/17  Yes Cammie Sickle, MD  naproxen sodium (ANAPROX) 220 MG tablet Take 220 mg by mouth as needed. As needed for pain   Yes Historical Provider, MD  ondansetron (ZOFRAN) 8 MG tablet Take 8 mg by mouth every 8 (eight) hours as needed for nausea or vomiting.   Yes  Historical Provider, MD  phenazopyridine (PYRIDIUM) 100 MG tablet Take 1 tablet (100 mg total) by mouth 3 (three) times daily as needed for pain (bladder spasms). 03/05/17  Yes Cammie Sickle, MD  polyethylene glycol (MIRALAX / GLYCOLAX) packet Take 17 g by mouth daily.   Yes Historical Provider, MD  potassium chloride SA (K-DUR,KLOR-CON) 20 MEQ tablet 1 pill twice a day x1 week. Patient taking differently: Take 20 mEq by mouth daily.  01/22/17  Yes Cammie Sickle, MD  prochlorperazine (COMPAZINE) 10 MG tablet Take 10 mg by mouth every 6 (six) hours as needed for nausea or vomiting.   Yes Historical Provider, MD  Sennosides (SENNA LAX PO) Take 1 tablet by mouth daily as needed.    Yes Historical Provider, MD  traMADol (ULTRAM) 50 MG tablet Take 1 tablet (50 mg total) by mouth every 6 (six) hours as needed. 03/12/17  Yes Cammie Sickle, MD  estradiol (ESTRACE) 0.1 MG/GM vaginal cream Place 1 Applicatorful vaginally at bedtime. Insert 1g nightly for 1 wk, then 1 g once weekly as maintenace Patient not taking: Reported on 1/32/4401 0/27/25   Elmo Putt B Copland, PA-C    Allergies: Allergies  Allergen Reactions  . Sulfa Antibiotics Hives    Social History:  reports that she has never smoked. She has never used smokeless tobacco. She reports that she does not drink alcohol or use drugs.   Family History: Family History  Problem Relation Age of Onset  . Cancer Father     medistinal  carcinoma  . Heart attack Father   . Hypertension Brother   . Diabetes Maternal Grandmother   . Diabetes Paternal Grandmother   . Cancer Brother     Capa C sarcoma    Review of Systems: Review of Systems  Constitutional: Negative for chills and fever.  HENT: Negative for hearing loss.   Eyes: Negative for blurred vision.  Respiratory: Negative for cough and shortness of breath.   Cardiovascular: Negative for chest pain and leg swelling.  Gastrointestinal: Positive for abdominal pain,  constipation, nausea and vomiting. Negative for diarrhea and heartburn.  Genitourinary: Positive for dysuria. Negative for hematuria.  Musculoskeletal: Negative for myalgias.  Skin: Negative for rash.  Neurological: Negative for dizziness.  Psychiatric/Behavioral: Negative for depression.  All other systems reviewed and are negative.   Physical Exam BP 113/72 (BP Location: Right Arm)   Pulse (!) 102   Temp 98.1 F (36.7 C) (Oral)   Resp 18   Ht 5\' 5"  (1.651 m)   Wt 54 kg (119 lb 1.6 oz)   SpO2 97%   BMI 19.82 kg/m  CONSTITUTIONAL: No acute distress HEENT:  Normocephalic, atraumatic, extraocular motion intact. NECK: Trachea is midline, and there is no jugular venous distension.  RESPIRATORY:  Lungs are clear, and breath sounds are equal bilaterally. Normal respiratory effort without pathologic use of accessory muscles. CARDIOVASCULAR: Heart is regular without murmurs, gallops, or rubs.  CHEST:  Left subclavian port-a-cath in place and accessed. GI: The abdomen is soft, somewhat distended, with tenderness to palpation in the right lower quadrant.  No peritoneal signs. MUSCULOSKELETAL:  Normal muscle strength and tone in all four extremities.  No peripheral edema or cyanosis. SKIN: Skin turgor is normal. There are no pathologic skin lesions.  NEUROLOGIC:  Motor and sensation is grossly normal.  Cranial nerves are grossly intact. PSYCH:  Alert and oriented to person, place and time. Affect is normal.  Laboratory Analysis: Results for orders placed or performed during the hospital encounter of 03/14/17 (from the past 24 hour(s))  CBC with Differential/Platelet     Status: Abnormal   Collection Time: 03/14/17 10:30 AM  Result Value Ref Range   WBC 11.2 (H) 3.6 - 11.0 K/uL   RBC 3.02 (L) 3.80 - 5.20 MIL/uL   Hemoglobin 9.9 (L) 12.0 - 16.0 g/dL   HCT 28.9 (L) 35.0 - 47.0 %   MCV 95.5 80.0 - 100.0 fL   MCH 32.7 26.0 - 34.0 pg   MCHC 34.3 32.0 - 36.0 g/dL   RDW 17.3 (H) 11.5 - 14.5 %    Platelets 277 150 - 440 K/uL   Neutrophils Relative % 90 %   Neutro Abs 10.0 (H) 1.4 - 6.5 K/uL   Lymphocytes Relative 2 %   Lymphs Abs 0.2 (L) 1.0 - 3.6 K/uL   Monocytes Relative 8 %   Monocytes Absolute 0.9 0.2 - 0.9 K/uL   Eosinophils Relative 0 %   Eosinophils Absolute 0.0 0 - 0.7 K/uL   Basophils Relative 0 %   Basophils Absolute 0.0 0 - 0.1 K/uL  Basic metabolic panel     Status: Abnormal   Collection Time: 03/14/17 10:30 AM  Result Value Ref Range   Sodium 132 (L) 135 - 145 mmol/L   Potassium 3.3 (L) 3.5 - 5.1 mmol/L   Chloride 91 (L) 101 - 111 mmol/L   CO2 30 22 - 32 mmol/L   Glucose, Bld 145 (H) 65 - 99 mg/dL   BUN 18 6 - 20 mg/dL   Creatinine, Ser 0.82 0.44 - 1.00 mg/dL   Calcium 9.6 8.9 - 10.3 mg/dL   GFR calc non Af Amer >60 >60 mL/min   GFR calc Af Amer >60 >60 mL/min   Anion gap 11 5 - 15  Hepatic function panel     Status: None   Collection Time: 03/14/17 10:30 AM  Result Value Ref Range   Total Protein 7.0 6.5 - 8.1 g/dL   Albumin 4.0 3.5 - 5.0 g/dL   AST 33 15 - 41 U/L   ALT 24 14 - 54 U/L   Alkaline Phosphatase 120 38 - 126 U/L   Total Bilirubin 0.7 0.3 - 1.2 mg/dL   Bilirubin, Direct 0.1 0.1 - 0.5 mg/dL   Indirect Bilirubin 0.6 0.3 - 0.9 mg/dL  Lipase, blood     Status: None   Collection Time: 03/14/17 10:30 AM  Result Value Ref Range   Lipase 16 11 - 51 U/L  Troponin I     Status: Abnormal   Collection Time: 03/14/17 10:30 AM  Result Value Ref Range   Troponin I 0.04 (HH) <0.03 ng/mL  Lactic acid, plasma     Status: None   Collection Time: 03/14/17 10:30 AM  Result Value Ref Range   Lactic Acid, Venous 1.2 0.5 - 1.9 mmol/L  Urinalysis, Complete w Microscopic     Status: Abnormal   Collection Time: 03/14/17  2:59 PM  Result Value Ref Range   Color, Urine AMBER (A) YELLOW   APPearance CLEAR (A) CLEAR   Specific Gravity, Urine 1.035 (H) 1.005 - 1.030   pH 5.0 5.0 - 8.0   Glucose, UA NEGATIVE NEGATIVE mg/dL   Hgb urine dipstick NEGATIVE  NEGATIVE   Bilirubin Urine NEGATIVE NEGATIVE   Ketones, ur NEGATIVE NEGATIVE mg/dL   Protein, ur NEGATIVE NEGATIVE mg/dL   Nitrite POSITIVE (A) NEGATIVE   Leukocytes, UA NEGATIVE NEGATIVE   RBC / HPF 0-5 0 - 5 RBC/hpf   WBC, UA 0-5 0 - 5 WBC/hpf   Bacteria, UA NONE SEEN NONE SEEN   Squamous Epithelial / LPF 6-30 (A) NONE SEEN   Mucous PRESENT    Non Squamous Epithelial 0-5 (A) NONE SEEN    Imaging: Dg Abdomen 1 View  Result Date: 03/14/2017 CLINICAL DATA:  Acute abdominal pain, concern for obstruction or free abdominal air EXAM: ABDOMEN - 1 VIEW COMPARISON:  None. FINDINGS: No definite evidence of free abdominal air. Gaseous distended bowel loops are noted mid and left lower abdomen with some air-fluid levels. Ileus or early bowel obstruction cannot be excluded. IMPRESSION: Gaseous distended bowel loops are noted mid and left lower abdomen with some air-fluid levels. Ileus or early bowel obstruction cannot be excluded. Electronically Signed   By: Lahoma Crocker M.D.   On: 03/14/2017 12:28   Ct Abdomen Pelvis W Contrast  Result Date: 03/14/2017 CLINICAL DATA:  Abdominal bloating, right upper quadrant pain and elevated white blood cell count. History of metastatic carcinoma of unknown primary. EXAM: CT ABDOMEN AND PELVIS WITH CONTRAST TECHNIQUE: Multidetector CT imaging of the abdomen and pelvis was performed using the standard protocol following bolus administration of intravenous contrast. CONTRAST:  75 ml ISOVUE-300 IOPAMIDOL (ISOVUE-300) INJECTION 61% COMPARISON:  CT chest, abdomen and pelvis 03/09/2017 and 01/29/2017. FINDINGS: Lower chest: No pleural or pericardial effusion. Small pleural base calcification right lower lobe noted. Mild dependent atelectasis. Fluid filled and dilated esophagus is noted. Hepatobiliary: Several small stones are seen in the neck of the gallbladder. No focal liver lesion. The biliary tree is unremarkable. Pancreas: Unremarkable. No pancreatic ductal dilatation or  surrounding inflammatory changes. Spleen: Normal in size without focal abnormality. Adrenals/Urinary Tract: There is severe right hydronephrosis which is worse than on the most recent comparison with an associated delayed nephrogram. The urinary bladder is incompletely distended with irregular thickening of its walls. The right ureter may be obstruction at the UVJ of the urinary bladder wall. No obstructing stone is seen. The left kidney appears normal. Adrenal glands are normal. Stomach/Bowel: The patient has a high grade small bowel obstruction. Transition point is at the terminal ileum. Walls of the terminal ileum are markedly an irregularly thickened. There is little to no surrounding inflammatory change. No pneumatosis or portal venous gas. No fatty proliferation of the right lower quadrant. The colon is nearly completely decompressed. The stomach is markedly distended with fluid. Vascular/Lymphatic: No lymphadenopathy. Reproductive: Uterus and bilateral adnexa are unremarkable. Other: Small volume of free pelvic fluid is noted. Musculoskeletal: No acute bony abnormality. No focal lesion is identified. IMPRESSION: High-grade small bowel obstruction with a transition point at the terminal ileum. Markedly thickened and irregular walls of the terminal ileum are identified and could be due to inflammatory change such as Crohn's disease but the appearance is also worrisome for mass lesion/small bowel neoplasm. Severe right hydronephrosis is worsened since the prior examination. No obstructing stone is identified. Obstruction may be at the right  UVJ as the urinary bladder walls are markedly thickened and irregular. Bladder neoplasm or cystitis are possible. Gallstones without evidence cholecystitis. Electronically Signed   By: Inge Rise M.D.   On: 03/14/2017 13:59   Dg Abd Portable 1 View  Result Date: 03/14/2017 CLINICAL DATA:  NG tube placement. EXAM: PORTABLE ABDOMEN - 1 VIEW COMPARISON:  CT scan  03/14/2017 FINDINGS: The NG tube tip is in the body region of the stomach. Persistent contrast noted in both collecting systems with right-sided hydroureteronephrosis. The lung bases are grossly clear. IMPRESSION: The NG tube tip is in the body region of the stomach. Electronically Signed   By: Marijo Sanes M.D.   On: 03/14/2017 16:51    Assessment and Plan: This is a 61 y.o. female who presents with small bowel obstruction secondary to significant inflammation of the terminal ileum.  I have independently viewed the patient's imaging studies and reviewed her laboratory studies and discussed them with the patient.  Overall the terminal ileum has significant inflammation with luminal narrowing and I believe that is what is causing the small bowel obstruction, rather than adhesions.  This may represent a type of neutropenic enterocolitis.  Patient has been admitted to the medical team.  NG tube was placed in the ED.  She will be started on IV antibiotics for possible enterocolitis.  Continue NPO with IV fluid hydration.  Recommend oncology consult given her metastatic cancer currently on chemotherapy, and urology consult for her right sided hydronephrosis which is worsening currently.  Will continue following along with you.  Currently no acute surgical need, and would defer any surgery unless truly necessary given her immunosuppression and comorbidities.   Melvyn Neth, [place]

## 2017-03-14 NOTE — Telephone Encounter (Signed)
md spoke with patient's husband-. Patient vomiting up brown bile.  md discussed scan results with patient's husband. Inflammation of bowel seen on CT scan. Due to the complexity of patient's condition, md advised pt to go to to ED asap.

## 2017-03-14 NOTE — H&P (Signed)
[place] [facility] [place] NAME: Antoine Fiallos    MR#:  [id number]  DATE OF BIRTH:  01/14/1956  DATE OF ADMISSION:  03/14/2017  PRIMARY CARE PHYSICIAN: Rusty Aus, MD   REQUESTING/REFERRING PHYSICIAN: Dr. Quentin Cornwall  CHIEF COMPLAINT:   Chief Complaint  Patient presents with  . Weakness  . Abdominal Pain    HISTORY OF PRESENT ILLNESS:  Jahnay Lantier  is a 61 y.o. female with a known history of Stage 4 metastatic cancer with unknown primary undergoing chemotherapy presents to the hospital due to acute onset of vomiting and abdominal pain. Patient had a recent CT scan one week back which showed inflammation in the cecal area. Patient was being monitored. Also found to have mild right-sided hydronephrosis and scheduled for cystoscopy with Dr. Erlene Quan as outpatient tomorrow. Today repeat CT scan in the emergency room shows likely small bowel obstruction with cecal inflammation. Does not have any clear mass. Seen by surgery and patient not thought to be a candidate for surgical intervention at this time. Also with no mass and NG tube has been placed. Presently her diagnosis is possible partial small bowel obstruction and right hydronephrosis. Patient is being undergoing chemotherapy at the cancer center with Dr. Rogue Bussing.  Once an NG tube was inserted sometime back patient follow-up 1500 ML of bile-colored vomitus. No blood. She has had very small hard stools to yesterday.  She is on ciprofloxacin for a possible UTI started by Dr. Sabra Heck her primary care physician 4 days back.  PAST MEDICAL HISTORY:   Past Medical History:  Diagnosis Date  . Arthritis   . Cancer ([place])    lymph nodes, in process of diagnosing  . Cervical disc disease    upper cervical, denied numbness / tingling BUE  . Cervical stenosis of spine   . Fracture 04/2016   left pubis bone-atypical   . Lymphadenopathy 09/05/2016   R clavical region  . Neuromuscular disorder  ([place])    compressed disc in neck when 53 or 75 years old  . Post-menopausal 2011  . Primary cancer of unknown site [facility])   . Tubular adenoma     PAST SURGICAL HISTORY:   Past Surgical History:  Procedure Laterality Date  . APPENDECTOMY  1986  . BREAST BIOPSY Right    at age 73  . CESAREAN SECTION  1988  . CESAREAN SECTION  1988  . COLONOSCOPY WITH PROPOFOL N/A 07/12/2015   Procedure: COLONOSCOPY WITH PROPOFOL;  Surgeon: Manya Silvas, MD;  Location: [facility] ENDOSCOPY;  Service: Endoscopy;  Laterality: N/A;  . EXPLORATORY LAPAROTOMY  1986  . LYMPH GLAND EXCISION    . MASS BIOPSY N/A 09/05/2016   Procedure: NECK MASS BIOPSY;  Surgeon: Beverly Gust, MD;  Location: [facility] ORS;  Service: ENT;  Laterality: N/A;  . MASS EXCISION  1986   to remove mass outside the uterus. procedure was combined tacked uterus, reposition of fallopian tubes   . PORTACATH PLACEMENT Left 09/20/2016   Procedure: INSERTION PORT-A-CATH;  Surgeon: Olean Ree, MD;  Location: [facility] ORS;  Service: General;  Laterality: Left;  . THORACOTOMY Right 03/2008   RLL Wedge Resection- Dr. Genevive Bi    SOCIAL HISTORY:   Social History  Substance Use Topics  . Smoking status: Never Smoker  . Smokeless tobacco: Never Used  . Alcohol use No    FAMILY HISTORY:   Family History  Problem Relation Age of Onset  . Cancer Father     medistinal  carcinoma  . Heart attack Father   . Hypertension Brother   . Diabetes Maternal Grandmother   . Diabetes Paternal Grandmother   . Cancer Brother     Capa C sarcoma    DRUG ALLERGIES:   Allergies  Allergen Reactions  . Sulfa Antibiotics Hives    REVIEW OF SYSTEMS:   Review of Systems  Constitutional: Positive for malaise/fatigue and weight loss. Negative for chills and fever.  HENT: Negative for sore throat.   Eyes: Negative for blurred vision, double vision and pain.  Respiratory: Negative for cough, hemoptysis, shortness of breath and wheezing.   Cardiovascular:  Negative for chest pain, palpitations, orthopnea and leg swelling.  Gastrointestinal: Positive for abdominal pain, constipation, nausea and vomiting. Negative for diarrhea and heartburn.  Genitourinary: Negative for dysuria and hematuria.  Musculoskeletal: Negative for back pain and joint pain.  Skin: Negative for rash.  Neurological: Positive for weakness. Negative for sensory change, speech change, focal weakness and headaches.  Endo/Heme/Allergies: Does not bruise/bleed easily.  Psychiatric/Behavioral: Negative for depression. The patient is not nervous/anxious.     MEDICATIONS AT HOME:   Prior to Admission medications   Medication Sig Start Date End Date Taking? Authorizing Provider  acetaminophen (TYLENOL) 500 MG tablet Take 1,000 mg by mouth daily as needed for moderate pain.   Yes Historical Provider, MD  Calcium Carb-Cholecalciferol (CALCIUM 600+D3 PO) Take 1 Dose by mouth daily.    Yes Historical Provider, MD  ciprofloxacin (CIPRO) 500 MG tablet Take 500 mg by mouth 2 (two) times daily. 03/09/17 03/15/17 Yes Historical Provider, MD  gabapentin (NEURONTIN) 100 MG capsule Take 100 mg by mouth 3 (three) times daily. 03/12/17 03/12/18 Yes Historical Provider, MD  lactulose (CEPHULAC) 10 g packet Take 10 grams three times daily for the next 2 days. Then 10 grams daily, can increase to 10 grams three times a day for future concerns for worsening constipation 09/12/16  Yes Nance Pear, MD  lidocaine-prilocaine (EMLA) cream Apply 1 application topically as needed. Apply generously over the Mediport 45 minutes prior to chemotherapy. 01/31/17  Yes Cammie Sickle, MD  naproxen sodium (ANAPROX) 220 MG tablet Take 220 mg by mouth as needed. As needed for pain   Yes Historical Provider, MD  ondansetron (ZOFRAN) 8 MG tablet Take 8 mg by mouth every 8 (eight) hours as needed for nausea or vomiting.   Yes Historical Provider, MD  phenazopyridine (PYRIDIUM) 100 MG tablet Take 1 tablet (100 mg total)  by mouth 3 (three) times daily as needed for pain (bladder spasms). 03/05/17  Yes Cammie Sickle, MD  polyethylene glycol (MIRALAX / GLYCOLAX) packet Take 17 g by mouth daily.   Yes Historical Provider, MD  potassium chloride SA (K-DUR,KLOR-CON) 20 MEQ tablet 1 pill twice a day x1 week. Patient taking differently: Take 20 mEq by mouth daily.  01/22/17  Yes Cammie Sickle, MD  prochlorperazine (COMPAZINE) 10 MG tablet Take 10 mg by mouth every 6 (six) hours as needed for nausea or vomiting.   Yes Historical Provider, MD  Sennosides (SENNA LAX PO) Take 1 tablet by mouth daily as needed.    Yes Historical Provider, MD  traMADol (ULTRAM) 50 MG tablet Take 1 tablet (50 mg total) by mouth every 6 (six) hours as needed. 03/12/17  Yes Cammie Sickle, MD  estradiol (ESTRACE) 0.1 MG/GM vaginal cream Place 1 Applicatorful vaginally at bedtime. Insert 1g nightly for 1 wk, then 1 g once weekly as maintenace Patient not taking: Reported on 03/14/2017  0/25/85   Alicia B Copland, PA-C     VITAL SIGNS:  Blood pressure 130/78, pulse (!) 103, temperature 97.7 F (36.5 C), temperature source Oral, resp. rate 14, height 5\' 5"  (1.651 m), weight 54.4 kg (120 lb), SpO2 98 %.  PHYSICAL EXAMINATION:  Physical Exam  GENERAL:  61 y.o.-year-old patient lying in the bed with no acute distress.  EYES: Pupils equal, round, reactive to light and accommodation. No scleral icterus. Extraocular muscles intact.  HEENT: Head atraumatic, normocephalic. Oropharynx and nasopharynx clear. No oropharyngeal erythema, moist oral mucosa  NECK:  Supple, no jugular venous distention. No thyroid enlargement, no tenderness.  LUNGS: Normal breath sounds bilaterally, no wheezing, rales, rhonchi. No use of accessory muscles of respiration.  CARDIOVASCULAR: S1, S2 normal. No murmurs, rubs, or gallops. Tachycardia ABDOMEN: Right lower tenderness. Bowel sounds absent. Soft. EXTREMITIES: No pedal edema, cyanosis, or clubbing. + 2  pedal & radial pulses b/l.   NEUROLOGIC: Cranial nerves II through XII are intact. No focal Motor or sensory deficits appreciated b/l PSYCHIATRIC: The patient is alert and oriented x 3. Good affect.  SKIN: No obvious rash, lesion, or ulcer.   LABORATORY PANEL:   CBC  Recent Labs Lab 03/14/17 1030  WBC 11.2*  HGB 9.9*  HCT 28.9*  PLT 277   ------------------------------------------------------------------------------------------------------------------  Chemistries   Recent Labs Lab 03/14/17 1030  NA 132*  K 3.3*  CL 91*  CO2 30  GLUCOSE 145*  BUN 18  CREATININE 0.82  CALCIUM 9.6  AST 33  ALT 24  ALKPHOS 120  BILITOT 0.7   ------------------------------------------------------------------------------------------------------------------  Cardiac Enzymes  Recent Labs Lab 03/14/17 1030  TROPONINI 0.04*   ------------------------------------------------------------------------------------------------------------------  RADIOLOGY:  Dg Abdomen 1 View  Result Date: 03/14/2017 CLINICAL DATA:  Acute abdominal pain, concern for obstruction or free abdominal air EXAM: ABDOMEN - 1 VIEW COMPARISON:  None. FINDINGS: No definite evidence of free abdominal air. Gaseous distended bowel loops are noted mid and left lower abdomen with some air-fluid levels. Ileus or early bowel obstruction cannot be excluded. IMPRESSION: Gaseous distended bowel loops are noted mid and left lower abdomen with some air-fluid levels. Ileus or early bowel obstruction cannot be excluded. Electronically Signed   By: Lahoma Crocker M.D.   On: 03/14/2017 12:28   Ct Abdomen Pelvis W Contrast  Result Date: 03/14/2017 CLINICAL DATA:  Abdominal bloating, right upper quadrant pain and elevated white blood cell count. History of metastatic carcinoma of unknown primary. EXAM: CT ABDOMEN AND PELVIS WITH CONTRAST TECHNIQUE: Multidetector CT imaging of the abdomen and pelvis was performed using the standard protocol  following bolus administration of intravenous contrast. CONTRAST:  75 ml ISOVUE-300 IOPAMIDOL (ISOVUE-300) INJECTION 61% COMPARISON:  CT chest, abdomen and pelvis 03/09/2017 and 01/29/2017. FINDINGS: Lower chest: No pleural or pericardial effusion. Small pleural base calcification right lower lobe noted. Mild dependent atelectasis. Fluid filled and dilated esophagus is noted. Hepatobiliary: Several small stones are seen in the neck of the gallbladder. No focal liver lesion. The biliary tree is unremarkable. Pancreas: Unremarkable. No pancreatic ductal dilatation or surrounding inflammatory changes. Spleen: Normal in size without focal abnormality. Adrenals/Urinary Tract: There is severe right hydronephrosis which is worse than on the most recent comparison with an associated delayed nephrogram. The urinary bladder is incompletely distended with irregular thickening of its walls. The right ureter may be obstruction at the UVJ of the urinary bladder wall. No obstructing stone is seen. The left kidney appears normal. Adrenal glands are normal. Stomach/Bowel: The patient has a high  grade small bowel obstruction. Transition point is at the terminal ileum. Walls of the terminal ileum are markedly an irregularly thickened. There is little to no surrounding inflammatory change. No pneumatosis or portal venous gas. No fatty proliferation of the right lower quadrant. The colon is nearly completely decompressed. The stomach is markedly distended with fluid. Vascular/Lymphatic: No lymphadenopathy. Reproductive: Uterus and bilateral adnexa are unremarkable. Other: Small volume of free pelvic fluid is noted. Musculoskeletal: No acute bony abnormality. No focal lesion is identified. IMPRESSION: High-grade small bowel obstruction with a transition point at the terminal ileum. Markedly thickened and irregular walls of the terminal ileum are identified and could be due to inflammatory change such as Crohn's disease but the appearance  is also worrisome for mass lesion/small bowel neoplasm. Severe right hydronephrosis is worsened since the prior examination. No obstructing stone is identified. Obstruction may be at the right UVJ as the urinary bladder walls are markedly thickened and irregular. Bladder neoplasm or cystitis are possible. Gallstones without evidence cholecystitis. Electronically Signed   By: Inge Rise M.D.   On: 03/14/2017 13:59   IMPRESSION AND PLAN:   * Typhlitis. Patient has had these findings on prior CT scans. We'll start her on IV ciprofloxacin and Flagyl. Discussed with Dr. Hampton Abbot of surgery. No surgical indication at this time. NG tube has been placed. Repeat abdominal x-ray in the morning. Nothing by mouth Depending on her progress will need a GI evaluation. Please consult GI tomorrow. No one on-call today.  * Right hydronephrosis due to bladder wall thickening. Possible neoplasm. Discussed with Dr. Erlene Quan of urology who will see the patient. Needs cystoscopy. Dr. Erlene Quan to decide inpatient versus outpatient needed.  * Stage IV metastatic cancer of unknown primary Consult oncology.  * DVT prophylaxis with Lovenox.  All the records are reviewed and case discussed with ED provider. Management plans discussed with the patient, family and they are in agreement.  CODE STATUS: FULL CODE  TOTAL TIME TAKING CARE OF THIS PATIENT: 40 minutes.   Hillary Bow R M.D on 03/14/2017 at 4:00 PM  Between 7am to 6pm - Pager - [phone]  After 6pm go to [email or web address] - password [email or web address] [facility]  Office  [phone]  CC: Primary care physician; Rusty Aus, MD  Note: This dictation was prepared with Dragon dictation along with smaller phrase technology. Any transcriptional errors that result from this process are unintentional.

## 2017-03-14 NOTE — ED Notes (Addendum)
73F NG tube placed at left nare. Pt vomited about 1500cc of dark brown emesis. Pt tolerated well and in no apparent distress at this time.

## 2017-03-14 NOTE — Consult Note (Signed)
[place] CONSULT NOTE  Patient Care Team: Rusty Aus, MD as PCP - General (Internal Medicine)  CHIEF COMPLAINTS/PURPOSE OF CONSULTATION:  Carcinoma of unknown primary.   HISTORY OF PRESENTING ILLNESS:  Sylvia Jones 61 y.o.  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 Patient also had worsening pain in her right lower quadrant. A CT scan in the emergency room showed high-grade small bowel obstruction likely the level of the terminal ileum; and also worsening right hydronephrosis. Patient had NG tube placed- with significant improvement of her symptoms/ output from the NG tube.   Patient denies any fevers. Denies any chills. Poor appetite. Denies any headaches. Denies any bone pain.  ROS: A complete 10 point review of system is done which is negative except mentioned above in history of present illness  MEDICAL HISTORY:  Past Medical History:  Diagnosis Date  . Arthritis   . Cancer ([place])    lymph nodes, in process of diagnosing  . Cervical disc disease    upper cervical, denied numbness / tingling BUE  . Cervical stenosis of spine   . Fracture 04/2016   left pubis bone-atypical   . Lymphadenopathy 09/05/2016   R clavical region  . Neuromuscular disorder ([place])    compressed disc in neck when 76 or 61 years old  . Post-menopausal 2011  . Primary cancer of unknown site [facility])   . Tubular adenoma     SURGICAL HISTORY: Past Surgical History:  Procedure Laterality Date  . APPENDECTOMY  1986  . BREAST BIOPSY Right    at age 25  . CESAREAN SECTION  1988  . CESAREAN SECTION  1988  . COLONOSCOPY WITH PROPOFOL N/A 07/12/2015   Procedure: COLONOSCOPY WITH PROPOFOL;  Surgeon: Manya Silvas, MD;  Location: [facility] ENDOSCOPY;  Service: Endoscopy;  Laterality:  N/A;  . EXPLORATORY LAPAROTOMY  1986  . LYMPH GLAND EXCISION    . MASS BIOPSY N/A 09/05/2016   Procedure: NECK MASS BIOPSY;  Surgeon: Beverly Gust, MD;  Location: [facility] ORS;  Service: ENT;  Laterality: N/A;  . MASS EXCISION  1986   to remove mass outside the uterus. procedure was combined tacked uterus, reposition of fallopian tubes   . PORTACATH PLACEMENT Left 09/20/2016   Procedure: INSERTION PORT-A-CATH;  Surgeon: Olean Ree, MD;  Location: [facility] ORS;  Service: General;  Laterality: Left;  . THORACOTOMY Right 03/2008   RLL Wedge Resection- Dr. Genevive Bi    SOCIAL HISTORY: Social History   Social History  . Marital status: Married    Spouse name: N/A  . Number of children: N/A  . Years of education: N/A   Occupational History  . Not on file.   Social History Main Topics  . Smoking status: Never Smoker  . Smokeless tobacco: Never Used  . Alcohol use No  . Drug use: No  . Sexual activity: Yes   Other Topics Concern  . Not on file   Social History Narrative  . No narrative on file    FAMILY HISTORY: Family History  Problem Relation Age of Onset  . Cancer Father     medistinal  carcinoma  . Heart attack Father   . Hypertension Brother   . Diabetes Maternal Grandmother   . Diabetes Paternal Grandmother   . Cancer Brother     Capa C  sarcoma    ALLERGIES:  is allergic to sulfa antibiotics.  MEDICATIONS:  Current Facility-Administered Medications  Medication Dose Route Frequency Provider Last Rate Last Dose  . 0.9 % NaCl with KCl 20 mEq/ L  infusion   Intravenous Continuous Srikar Sudini, MD      . acetaminophen (TYLENOL) tablet 650 mg  650 mg Oral Q6H PRN Hillary Bow, MD       Or  . acetaminophen (TYLENOL) suppository 650 mg  650 mg Rectal Q6H PRN Srikar Sudini, MD      . enoxaparin (LOVENOX) injection 40 mg  40 mg Subcutaneous Q24H Srikar Sudini, MD      . fentaNYL (SUBLIMAZE) injection 100 mcg  100 mcg Intravenous Q1H PRN Merlyn Lot, MD   100 mcg at  03/14/17 1817  . morphine 2 MG/ML injection 2 mg  2 mg Intravenous Q4H PRN Srikar Sudini, MD      . ondansetron (ZOFRAN) tablet 4 mg  4 mg Oral Q6H PRN Srikar Sudini, MD       Or  . ondansetron (ZOFRAN) injection 4 mg  4 mg Intravenous Q6H PRN Hillary Bow, MD      . pentafluoroprop-tetrafluoroeth (GEBAUERS) aerosol           . piperacillin-tazobactam (ZOSYN) IVPB 3.375 g  3.375 g Intravenous Q8H Srikar Sudini, MD       Facility-Administered Medications Ordered in Other Encounters  Medication Dose Route Frequency Provider Last Rate Last Dose  . heparin lock flush 100 unit/mL  500 Units Intravenous Once Cammie Sickle, MD          .  PHYSICAL EXAMINATION:  Vitals:   03/14/17 1701 03/14/17 1801  BP: 114/73 113/72  Pulse: (!) 103 (!) 102  Resp: 16 18  Temp:  98.1 F (36.7 C)   Filed Weights   03/14/17 1028 03/14/17 1801  Weight: 120 lb (54.4 kg) 119 lb 1.6 oz (54 kg)    GENERAL: Moderately nourished; thin built Alert, no distress and comfortable.   Accompanied by her son and husband. EYES: no pallor or icterus OROPHARYNX: no thrush or ulceration. NG tube in place NECK: supple, approximately 2 cm lymph node felt in the left neck. LYMPH:  no palpable lymphadenopathy in the  axillary or inguinal regions LUNGS: decreased breath sounds to auscultation at bases and  No wheeze or crackles HEART/CVS: regular rate & rhythm and no murmurs; No lower extremity edema ABDOMEN: abdomen soft, non-tender and decreased bowel sounds Musculoskeletal:no cyanosis of digits and no clubbing  PSYCH: alert & oriented x 3 with fluent speech NEURO: no focal motor/sensory deficits SKIN:  no rashes or significant lesions  LABORATORY DATA:  I have reviewed the data as listed Lab Results  Component Value Date   WBC 11.2 (H) 03/14/2017   HGB 9.9 (L) 03/14/2017   HCT 28.9 (L) 03/14/2017   MCV 95.5 03/14/2017   PLT 277 03/14/2017    Recent Labs  02/21/17 1001 03/05/17 1037 03/14/17 1030   NA 136 138 132*  K 3.9 3.9 3.3*  CL 104 104 91*  CO2 28 27 30   GLUCOSE 98 97 145*  BUN 7 8 18   CREATININE 0.37* 0.35* 0.82  CALCIUM 8.8* 9.0 9.6  GFRNONAA >60 >60 >60  GFRAA >60 >60 >60  PROT 7.0 6.9 7.0  ALBUMIN 3.9 4.1 4.0  AST 31 26 33  ALT 21 23 24   ALKPHOS 90 128* 120  BILITOT 0.4 0.4 0.7  BILIDIR  --   --  0.1  IBILI  --   --  0.6    RADIOGRAPHIC STUDIES: I have personally reviewed the radiological images as listed and agreed with the findings in the report. Dg Abdomen 1 View  Result Date: 03/14/2017 CLINICAL DATA:  Acute abdominal pain, concern for obstruction or free abdominal air EXAM: ABDOMEN - 1 VIEW COMPARISON:  None. FINDINGS: No definite evidence of free abdominal air. Gaseous distended bowel loops are noted mid and left lower abdomen with some air-fluid levels. Ileus or early bowel obstruction cannot be excluded. IMPRESSION: Gaseous distended bowel loops are noted mid and left lower abdomen with some air-fluid levels. Ileus or early bowel obstruction cannot be excluded. Electronically Signed   By: Lahoma Crocker M.D.   On: 03/14/2017 12:28   Ct Soft Tissue Neck W Contrast  Result Date: 03/09/2017 CLINICAL DATA:  Carcinoma of unknown primary. Increasing prominence in the left submandibular region. EXAM: CT NECK WITH CONTRAST TECHNIQUE: Multidetector CT imaging of the neck was performed using the standard protocol following the bolus administration of intravenous contrast. CONTRAST:  133mL ISOVUE-300 IOPAMIDOL (ISOVUE-300) INJECTION 61% COMPARISON:  01/29/2017.  12/11/2016.  11/10/2016. FINDINGS: Pharynx and larynx: No mucosal or submucosal lesion. Salivary glands: Submandibular glands are normal. Parotid glands are intrinsically normal. Parotid lymph node inferiorly on the left has enlarged slightly, measuring 9.3 mm in diameter as opposed to 7.7 mm in diameter previously. Thyroid: Normal Lymph nodes: Level 2 lymph node on the left is stable over the last 3 scans, short axis  diameter 1 cm. Other lymph nodes previously seen in the left neck that improved following therapy have not shown evidence of recurrent enlargement. No newly abnormal nodes. Vascular: Patent and normal Limited intracranial: Normal Visualized orbits: Normal Mastoids and visualized paranasal sinuses: Clear Skeleton: Healed pathologic fracture of the spinous process of C7 with sclerosis. No lytic change or extraosseous tumor. Sclerotic metastatic disease within the T2 vertebral body does not show any evidence of progression. No soft tissue mass. No new lesion. Upper chest: See results of chest CT IMPRESSION: The only progressive change since the previous study is slight enlargement of a left parotid lymph node inferiorly in the superficial lobe, measuring 9.3 mm in diameter as opposed 7.7 mm previously. This change is of concern but not definitely significant. Follow on subsequent scans. No change of an enlarged level 2 node on the left with short axis diameter of 10 mm. Other treated regressed nodes appear stable. No progressive bone change. Sclerotic changes of the spinous process of C7 and the T2 vertebral body. Electronically Signed   By: Nelson Chimes M.D.   On: 03/09/2017 16:07   Ct Chest W Contrast  Result Date: 03/09/2017 CLINICAL DATA:  Carcinoma of unknown primary. EXAM: CT CHEST, ABDOMEN, AND PELVIS WITH CONTRAST TECHNIQUE: Multidetector CT imaging of the chest, abdomen and pelvis was performed following the standard protocol during bolus administration of intravenous contrast. CONTRAST:  126mL ISOVUE-300 IOPAMIDOL (ISOVUE-300) INJECTION 61% COMPARISON:  01/29/2017 FINDINGS: CT CHEST FINDINGS Cardiovascular: The heart size appears within normal limits. No pericardial effusion identified. Aortic atherosclerosis. Mediastinum/Nodes: The trachea appears patent and is midline. Normal appearance of the esophagus. The sub- carinal nodal mass measures 1.6 x 1.4 cm, image 24 of series 3. Previously 1.8 x 1.8 cm.  Right hilar node measures 1 cm, image 29 of series 3. Previously 1.1 cm Lungs/Pleura: No pleural effusion. Scarring within the right lower lobe identified. Unchanged from previous exam. No suspicious pulmonary nodules identified. Musculoskeletal: Degenerative disc disease is identified within  the thoracic spine. Sclerotic metastases involving the T3 vertebra is increased in size. Currently 2.6 x 1.6 cm, image 21 of series 6. Previously 1.8 x 1.2 cm. Sclerotic metastasis involving the left first rib is unchanged. CT ABDOMEN PELVIS FINDINGS Hepatobiliary: There is no suspicious liver abnormality. Multiple stones are again noted within the gallbladder. There is mild diffuse gallbladder wall edema. Similar to previous exam. Pancreas: Unremarkable. No pancreatic ductal dilatation or surrounding inflammatory changes. Spleen: Normal in size without focal abnormality. Adrenals/Urinary Tract: The adrenal glands appear normal. Normal appearance of the left kidney. There is progressive right-sided hydronephrosis and hydroureter. Irregular bladder wall thickening is identified and appears progressive when compared with 01/29/2017. Stomach/Bowel: Small hiatal hernia. The stomach appears unremarkable. The proximal small bowel loops appear unremarkable. Again noted is abnormal bowel wall thickening involving the right lower quadrant bowel loops including the ileocecal valve and cecum. The appearance is similar to the previous examination, image 95 of series 3. No resulting bowel obstruction. No additional colonic abnormalities identified. Vascular/Lymphatic: Normal appearance of the abdominal aorta. No upper abdominal adenopathy. Right external iliac lymph node is unchanged measuring 8 mm, image 103 of series 3. Reproductive: The uterus and adnexal structures are unremarkable. Other: No free fluid or fluid collections identified. Musculoskeletal: There is abnormal asymmetric sclerosis involving the left superior pubic rami. This  is unchanged when compared with the previous exam, image 112 of series 3. IMPRESSION: 1. Mild progressive improvement in mediastinal and right hilar adenopathy. No evidence for recurrent abdominal adenopathy. 2. Increase in T3 vertebral body sclerotic metastases. Similar appearance of sclerotic metastasis is involving the left first rib and left superior pubic rami. 3. Persistent abnormal wall thickening involving the ileocecal valve and cecum. Presumably inflammatory. 4. Abnormal bladder wall irregularity appears progressive when compared with previous exam and there is increase in right-sided hydronephrosis and hydroureter. Correlate for any clinical signs or symptoms of urinary tract infection/cystitis. 5. Gallstones with mild chronic gallbladder wall thickening. Electronically Signed   By: Kerby Moors M.D.   On: 03/09/2017 17:19   Ct Abdomen Pelvis W Contrast  Result Date: 03/14/2017 CLINICAL DATA:  Abdominal bloating, right upper quadrant pain and elevated white blood cell count. History of metastatic carcinoma of unknown primary. EXAM: CT ABDOMEN AND PELVIS WITH CONTRAST TECHNIQUE: Multidetector CT imaging of the abdomen and pelvis was performed using the standard protocol following bolus administration of intravenous contrast. CONTRAST:  75 ml ISOVUE-300 IOPAMIDOL (ISOVUE-300) INJECTION 61% COMPARISON:  CT chest, abdomen and pelvis 03/09/2017 and 01/29/2017. FINDINGS: Lower chest: No pleural or pericardial effusion. Small pleural base calcification right lower lobe noted. Mild dependent atelectasis. Fluid filled and dilated esophagus is noted. Hepatobiliary: Several small stones are seen in the neck of the gallbladder. No focal liver lesion. The biliary tree is unremarkable. Pancreas: Unremarkable. No pancreatic ductal dilatation or surrounding inflammatory changes. Spleen: Normal in size without focal abnormality. Adrenals/Urinary Tract: There is severe right hydronephrosis which is worse than on the  most recent comparison with an associated delayed nephrogram. The urinary bladder is incompletely distended with irregular thickening of its walls. The right ureter may be obstruction at the UVJ of the urinary bladder wall. No obstructing stone is seen. The left kidney appears normal. Adrenal glands are normal. Stomach/Bowel: The patient has a high grade small bowel obstruction. Transition point is at the terminal ileum. Walls of the terminal ileum are markedly an irregularly thickened. There is little to no surrounding inflammatory change. No pneumatosis or portal venous gas. No fatty  proliferation of the right lower quadrant. The colon is nearly completely decompressed. The stomach is markedly distended with fluid. Vascular/Lymphatic: No lymphadenopathy. Reproductive: Uterus and bilateral adnexa are unremarkable. Other: Small volume of free pelvic fluid is noted. Musculoskeletal: No acute bony abnormality. No focal lesion is identified. IMPRESSION: High-grade small bowel obstruction with a transition point at the terminal ileum. Markedly thickened and irregular walls of the terminal ileum are identified and could be due to inflammatory change such as Crohn's disease but the appearance is also worrisome for mass lesion/small bowel neoplasm. Severe right hydronephrosis is worsened since the prior examination. No obstructing stone is identified. Obstruction may be at the right UVJ as the urinary bladder walls are markedly thickened and irregular. Bladder neoplasm or cystitis are possible. Gallstones without evidence cholecystitis. Electronically Signed   By: Inge Rise M.D.   On: 03/14/2017 13:59   Ct Abdomen Pelvis W Contrast  Result Date: 03/09/2017 CLINICAL DATA:  Carcinoma of unknown primary. EXAM: CT CHEST, ABDOMEN, AND PELVIS WITH CONTRAST TECHNIQUE: Multidetector CT imaging of the chest, abdomen and pelvis was performed following the standard protocol during bolus administration of intravenous  contrast. CONTRAST:  160mL ISOVUE-300 IOPAMIDOL (ISOVUE-300) INJECTION 61% COMPARISON:  01/29/2017 FINDINGS: CT CHEST FINDINGS Cardiovascular: The heart size appears within normal limits. No pericardial effusion identified. Aortic atherosclerosis. Mediastinum/Nodes: The trachea appears patent and is midline. Normal appearance of the esophagus. The sub- carinal nodal mass measures 1.6 x 1.4 cm, image 24 of series 3. Previously 1.8 x 1.8 cm. Right hilar node measures 1 cm, image 29 of series 3. Previously 1.1 cm Lungs/Pleura: No pleural effusion. Scarring within the right lower lobe identified. Unchanged from previous exam. No suspicious pulmonary nodules identified. 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 Sclerotic metastases involving the T3 vertebra is increased in size. Currently 2.6 x 1.6 cm, image 21 of series 6. Previously 1.8 x 1.2 cm. Sclerotic metastasis involving the left first rib is unchanged. CT ABDOMEN PELVIS FINDINGS Hepatobiliary: There is no suspicious liver abnormality. Multiple stones are again noted within the gallbladder. There is mild diffuse gallbladder wall edema. Similar to previous exam. Pancreas: Unremarkable. No pancreatic ductal dilatation or surrounding inflammatory changes. Spleen: Normal in size without focal abnormality. Adrenals/Urinary Tract: The adrenal glands appear normal. Normal appearance of the left kidney. There is progressive right-sided hydronephrosis and hydroureter. Irregular bladder wall thickening is identified and appears progressive when compared with 01/29/2017. Stomach/Bowel: Small hiatal hernia. The stomach appears unremarkable. The proximal small bowel loops appear unremarkable. Again noted is abnormal bowel wall thickening involving the right lower quadrant bowel loops including the ileocecal valve and cecum. The appearance is similar to the previous examination, image 95 of series 3. No resulting bowel obstruction. No  additional colonic abnormalities identified. Vascular/Lymphatic: Normal appearance of the abdominal aorta. No upper abdominal adenopathy. Right external iliac lymph node is unchanged measuring 8 mm, image 103 of series 3. Reproductive: The uterus and adnexal structures are unremarkable. Other: No free fluid or fluid collections identified. Musculoskeletal: There is abnormal asymmetric sclerosis involving the left superior pubic rami. This is unchanged when compared with the previous exam, image 112 of series 3. IMPRESSION: 1. Mild progressive improvement in mediastinal and right hilar adenopathy. No evidence for recurrent abdominal adenopathy. 2. Increase in T3 vertebral body sclerotic metastases. Similar appearance of sclerotic metastasis is involving the left first rib and left superior pubic rami. 3. Persistent abnormal wall thickening involving the ileocecal valve and cecum. Presumably inflammatory. 4. Abnormal  bladder wall irregularity appears progressive when compared with previous exam and there is increase in right-sided hydronephrosis and hydroureter. Correlate for any clinical signs or symptoms of urinary tract infection/cystitis. 5. Gallstones with mild chronic gallbladder wall thickening. Electronically Signed   By: Kerby Moors M.D.   On: 03/09/2017 17:19   Dg Abd Portable 1 View  Result Date: 03/14/2017 CLINICAL DATA:  NG tube placement. EXAM: PORTABLE ABDOMEN - 1 VIEW COMPARISON:  CT scan 03/14/2017 FINDINGS: The NG tube tip is in the body region of the stomach. Persistent contrast noted in both collecting systems with right-sided hydroureteronephrosis. The lung bases are grossly clear. IMPRESSION: The NG tube tip is in the body region of the stomach. Electronically Signed   By: Marijo Sanes M.D.   On: 03/14/2017 16:51    ASSESSMENT & PLAN:   # 61 year old female patient with a history of carcinoma of unknown primary/likely breast- on chemotherapy- is currently admitted the hospital for  abdominal distention nausea vomiting- noted to have small bowel obstruction/ and also worsening right-sided hydro-nephrosis.  # Carcinoma of unknown primary- likely breast- most recently on Adriamycin Cytoxan cycle #4- CT scan shows overall improvement response except for slight increase in the left neck lymph node.   # Small bowel obstruction- likely secondary to inflammation has infection of the terminal ileum/ cecum- question typhlitis versus others. Status post NG tube placement. Appreciate surgical evaluation. Patient may need GI evaluation once acute issues resolved. Recommend switching the antibiotics to Zosyn- for broader gram-negative and positive and anaerobic coverage.  # Right hydronephrosis progressive over the last few weeks-awaiting evaluation with urology.   # Plan of care was discussed with the patient/husband and son by the bedside.   Thank you Dr. Darvin Neighbours for allowing me to participate in the care of your pleasant patient. Please do not hesitate to contact me with questions or concerns in the interim. Discussed with Dr. Darvin Neighbours.   All questions were answered. The patient knows to call the clinic with any problems, questions or concerns.    Cammie Sickle, MD 03/14/2017 6:46 PM

## 2017-03-14 NOTE — ED Provider Notes (Signed)
[facility] Emergency Department Provider Note    None    (approximate)  I have reviewed the triage vital signs and the nursing notes.   HISTORY  Chief Complaint Weakness and Abdominal Pain    HPI ROBENA EWY is a 61 y.o. female undergoing chemotherapy for metastatic cancer of unknown primary presents with several days of worsening right upper quadrant and right lower quadrant abdominal pain associated with increased belching, nausea, feculent emesis and pain. He has had significant decreased oral intake. Denies any chest pain or shortness of breath. No measured fevers or chills. Patient is the mother of a PA that works in this [employer]. He states that she's had significant decline over the past several days. Has been unable to tolerate any food or oral hydration.   Past Medical History:  Diagnosis Date  . Arthritis   . Cancer ([place])    lymph nodes, in process of diagnosing  . Cervical disc disease    upper cervical, denied numbness / tingling BUE  . Cervical stenosis of spine   . Fracture 04/2016   left pubis bone-atypical   . Lymphadenopathy 09/05/2016   R clavical region  . Neuromuscular disorder ([place])    compressed disc in neck when 71 or 61 years old  . Post-menopausal 2011  . Primary cancer of unknown site [facility])   . Tubular adenoma    Family History  Problem Relation Age of Onset  . Cancer Father     medistinal  carcinoma  . Heart attack Father   . Hypertension Brother   . Diabetes Maternal Grandmother   . Diabetes Paternal Grandmother   . Cancer Brother     Capa C sarcoma   Past Surgical History:  Procedure Laterality Date  . APPENDECTOMY  1986  . BREAST BIOPSY Right    at age 72  . CESAREAN SECTION  1988  . CESAREAN SECTION  1988  . COLONOSCOPY WITH PROPOFOL N/A 07/12/2015   Procedure: COLONOSCOPY WITH PROPOFOL;  Surgeon: Manya Silvas, MD;  Location: [facility] ENDOSCOPY;  Service: Endoscopy;  Laterality: N/A;  . EXPLORATORY  LAPAROTOMY  1986  . LYMPH GLAND EXCISION    . MASS BIOPSY N/A 09/05/2016   Procedure: NECK MASS BIOPSY;  Surgeon: Beverly Gust, MD;  Location: [facility] ORS;  Service: ENT;  Laterality: N/A;  . MASS EXCISION  1986   to remove mass outside the uterus. procedure was combined tacked uterus, reposition of fallopian tubes   . PORTACATH PLACEMENT Left 09/20/2016   Procedure: INSERTION PORT-A-CATH;  Surgeon: Olean Ree, MD;  Location: [facility] ORS;  Service: General;  Laterality: Left;  . THORACOTOMY Right 03/2008   RLL Wedge Resection- Dr. Genevive Bi   Patient Active Problem List   Diagnosis Date Noted  . Counseling regarding goals of care 12/13/2016  . Pain and swelling of left lower extremity 12/06/2016  . Cancer, metastatic to bone ([place]) 09/14/2016  . Chemotherapy-induced nausea 09/07/2016  . Primary cancer of unknown site ([place]) 08/25/2016  . Lymphadenopathy 08/11/2016  . Cervical disc disease 03/08/2016  . Tubular adenoma 03/08/2016      Prior to Admission medications   Medication Sig Start Date End Date Taking? Authorizing Provider  acetaminophen (TYLENOL) 500 MG tablet Take 1,000 mg by mouth daily as needed for moderate pain.   Yes Historical Provider, MD  Calcium Carb-Cholecalciferol (CALCIUM 600+D3 PO) Take 1 Dose by mouth daily.    Yes Historical Provider, MD  ciprofloxacin (CIPRO) 500 MG tablet Take 500 mg  by mouth 2 (two) times daily. 03/09/17 03/15/17 Yes Historical Provider, MD  gabapentin (NEURONTIN) 100 MG capsule Take 100 mg by mouth 3 (three) times daily. 03/12/17 03/12/18 Yes Historical Provider, MD  lactulose (CEPHULAC) 10 g packet Take 10 grams three times daily for the next 2 days. Then 10 grams daily, can increase to 10 grams three times a day for future concerns for worsening constipation 09/12/16  Yes Nance Pear, MD  lidocaine-prilocaine (EMLA) cream Apply 1 application topically as needed. Apply generously over the Mediport 45 minutes prior to chemotherapy. 01/31/17  Yes  Cammie Sickle, MD  naproxen sodium (ANAPROX) 220 MG tablet Take 220 mg by mouth as needed. As needed for pain   Yes Historical Provider, MD  ondansetron (ZOFRAN) 8 MG tablet Take 8 mg by mouth every 8 (eight) hours as needed for nausea or vomiting.   Yes Historical Provider, MD  phenazopyridine (PYRIDIUM) 100 MG tablet Take 1 tablet (100 mg total) by mouth 3 (three) times daily as needed for pain (bladder spasms). 03/05/17  Yes Cammie Sickle, MD  polyethylene glycol (MIRALAX / GLYCOLAX) packet Take 17 g by mouth daily.   Yes Historical Provider, MD  potassium chloride SA (K-DUR,KLOR-CON) 20 MEQ tablet 1 pill twice a day x1 week. Patient taking differently: Take 20 mEq by mouth daily.  01/22/17  Yes Cammie Sickle, MD  prochlorperazine (COMPAZINE) 10 MG tablet Take 10 mg by mouth every 6 (six) hours as needed for nausea or vomiting.   Yes Historical Provider, MD  Sennosides (SENNA LAX PO) Take 1 tablet by mouth daily as needed.    Yes Historical Provider, MD  traMADol (ULTRAM) 50 MG tablet Take 1 tablet (50 mg total) by mouth every 6 (six) hours as needed. 03/12/17  Yes Cammie Sickle, MD  estradiol (ESTRACE) 0.1 MG/GM vaginal cream Place 1 Applicatorful vaginally at bedtime. Insert 1g nightly for 1 wk, then 1 g once weekly as maintenace Patient not taking: Reported on 6/60/6301 04/27/08   Elmo Putt B Copland, PA-C    Allergies Sulfa antibiotics    Social History Social History  Substance Use Topics  . Smoking status: Never Smoker  . Smokeless tobacco: Never Used  . Alcohol use No    Review of Systems Patient denies headaches, rhinorrhea, blurry vision, numbness, shortness of breath, chest pain, edema, cough, abdominal pain, nausea, vomiting, diarrhea, dysuria, fevers, rashes or hallucinations unless otherwise stated above in HPI. ____________________________________________   PHYSICAL EXAM:  VITAL SIGNS: Vitals:   03/14/17 1230 03/14/17 1408  BP: 120/80 130/78    Pulse: 100 (!) 103  Resp: 14 14  Temp:      Constitutional: Alert and oriented. Frail and dehydrated appearing but in no acute distress. Eyes: Conjunctivae are normal. PERRL. EOMI. Head: Atraumatic. Nose: No congestion/rhinnorhea. Mouth/Throat: Mucous membranes are moist.  Oropharynx non-erythematous. Neck: No stridor. Painless ROM. No cervical spine tenderness to palpation Hematological/Lymphatic/Immunilogical: No cervical lymphadenopathy. Cardiovascular: Normal rate, regular rhythm. Grossly normal heart sounds.  Good peripheral circulation. Respiratory: Normal respiratory effort.  No retractions. Lungs CTAB. Gastrointestinal: + RUQ ttp and rlq ttp. No distention. No abdominal bruits. No CVA tenderness. Musculoskeletal: No lower extremity tenderness nor edema.  No joint effusions. Neurologic:  Normal speech and language. No gross focal neurologic deficits are appreciated. No gait instability. Skin:  Skin is warm, dry and intact. No rash noted. Psychiatric: Mood and affect are normal. Speech and behavior are normal.  ____________________________________________   LABS (all labs ordered are listed, but  only abnormal results are displayed)  Results for orders placed or performed during the hospital encounter of 03/14/17 (from the past 24 hour(s))  CBC with Differential/Platelet     Status: Abnormal   Collection Time: 03/14/17 10:30 AM  Result Value Ref Range   WBC 11.2 (H) 3.6 - 11.0 K/uL   RBC 3.02 (L) 3.80 - 5.20 MIL/uL   Hemoglobin 9.9 (L) 12.0 - 16.0 g/dL   HCT 28.9 (L) 35.0 - 47.0 %   MCV 95.5 80.0 - 100.0 fL   MCH 32.7 26.0 - 34.0 pg   MCHC 34.3 32.0 - 36.0 g/dL   RDW 17.3 (H) 11.5 - 14.5 %   Platelets 277 150 - 440 K/uL   Neutrophils Relative % 90 %   Neutro Abs 10.0 (H) 1.4 - 6.5 K/uL   Lymphocytes Relative 2 %   Lymphs Abs 0.2 (L) 1.0 - 3.6 K/uL   Monocytes Relative 8 %   Monocytes Absolute 0.9 0.2 - 0.9 K/uL   Eosinophils Relative 0 %   Eosinophils Absolute 0.0  0 - 0.7 K/uL   Basophils Relative 0 %   Basophils Absolute 0.0 0 - 0.1 K/uL  Basic metabolic panel     Status: Abnormal   Collection Time: 03/14/17 10:30 AM  Result Value Ref Range   Sodium 132 (L) 135 - 145 mmol/L   Potassium 3.3 (L) 3.5 - 5.1 mmol/L   Chloride 91 (L) 101 - 111 mmol/L   CO2 30 22 - 32 mmol/L   Glucose, Bld 145 (H) 65 - 99 mg/dL   BUN 18 6 - 20 mg/dL   Creatinine, Ser 0.82 0.44 - 1.00 mg/dL   Calcium 9.6 8.9 - 10.3 mg/dL   GFR calc non Af Amer >60 >60 mL/min   GFR calc Af Amer >60 >60 mL/min   Anion gap 11 5 - 15  Hepatic function panel     Status: None   Collection Time: 03/14/17 10:30 AM  Result Value Ref Range   Total Protein 7.0 6.5 - 8.1 g/dL   Albumin 4.0 3.5 - 5.0 g/dL   AST 33 15 - 41 U/L   ALT 24 14 - 54 U/L   Alkaline Phosphatase 120 38 - 126 U/L   Total Bilirubin 0.7 0.3 - 1.2 mg/dL   Bilirubin, Direct 0.1 0.1 - 0.5 mg/dL   Indirect Bilirubin 0.6 0.3 - 0.9 mg/dL  Lipase, blood     Status: None   Collection Time: 03/14/17 10:30 AM  Result Value Ref Range   Lipase 16 11 - 51 U/L  Troponin I     Status: Abnormal   Collection Time: 03/14/17 10:30 AM  Result Value Ref Range   Troponin I 0.04 (HH) <0.03 ng/mL  Lactic acid, plasma     Status: None   Collection Time: 03/14/17 10:30 AM  Result Value Ref Range   Lactic Acid, Venous 1.2 0.5 - 1.9 mmol/L   ____________________________________________  EKG My review and personal interpretation at Time: 10:34   Indication: abdominal pain  Rate: 95  Rhythm: sinus Axis: normal Other: no acute st elevations or depressions ____________________________________________  RADIOLOGY  I personally reviewed all radiographic images ordered to evaluate for the above acute complaints and reviewed radiology reports and findings.  These findings were personally discussed with the patient.  Please see medical record for radiology report.  ____________________________________________   PROCEDURES  Procedure(s)  performed:  Procedures    Critical Care performed: no ____________________________________________   INITIAL IMPRESSION / ASSESSMENT  AND PLAN / ED COURSE  Pertinent labs & imaging results that were available during my care of the patient were reviewed by me and considered in my medical decision making (see chart for details).  DDX: sbo, coltiis, dehydtation, abscess,  GEORGI TUEL is a 61 y.o. who presents to the ED with Chief complaint of nausea vomiting and pelvic pain as described above. Patient arrives afebrile, tachycardic but otherwise well perfused. Presentation given her history concerning for small bowel obstruction. Less likely cholelithiasis. Blood work is fairly reassuring she has no significant neutropenia. No significant electrolyte abnormality. Symptoms did mildly improve with IV fentanyl and Phenergan. Will order CT scan.  Clinical Course as of Mar 14 1505  Wed Mar 14, 2017  1410 Showed evidence of small bowel obstruction on CT imaging. General surgery consulted.    [PR]  [id number] IAXK place NG tube.   [PR]    Clinical Course User Index [PR] Merlyn Lot, MD   ----------------------------------------- 3:07 PM on 03/14/2017 -----------------------------------------  Spoke with Dr. Roderick Pee a of general surgery who will evaluate patient but given her multiple comorbidities with stage IV metastatic cancer will proceed with NG tube decompression.  We'll give IV antibiotics for component of possible infectious process. Patient will require admission for oncologic as well as urologic and surgical evaluation.  Have discussed with the patient and available family all diagnostics and treatments performed thus far and all questions were answered to the best of my ability. The patient demonstrates understanding and agreement with plan.   ____________________________________________   FINAL CLINICAL IMPRESSION(S) / ED DIAGNOSES  Final diagnoses:  RUQ abdominal pain    SBO (small bowel obstruction) (HCC)  Intractable vomiting with nausea, unspecified vomiting type  Hydronephrosis with ureteropelvic junction (UPJ) obstruction      NEW MEDICATIONS STARTED DURING THIS VISIT:  New Prescriptions   No medications on file     Note:  This document was prepared using Dragon voice recognition software and may include unintentional dictation errors.    Merlyn Lot, MD 03/14/17 [phone]

## 2017-03-14 NOTE — ED Triage Notes (Signed)
Pt arrives with complaints of weakness, states she has had some lower pelvic pressure and pain for 2 weeks, states she had a CT scan on Friday which showed a "obstruction in my kidney" per pt, states continued pelvic pain, weakness, nausea and constipation, states small bowel movements since Friday despite OTC laxatives, pt currently on chemo for hx of cancer, awake and alert, family at bedside

## 2017-03-14 NOTE — Progress Notes (Signed)
03/14/17  Patient seen in the ED with small bowel obstruction. Has metastatic cancer of unknown etiology, favoring metastatic breast cancer, with lymphadenopathy of the neck, chest, and abdomen, and bone lesions. SBO appears to be due to area of continued inflammation in the terminal ileum, noted on previous CT scans, rather than adhesion.  Recommendations: --Admit to medical team --NG tube placement for decompression --Start Cipro / Flagyl.  Question if this could be a type of neutropenic enterocolitis. --Consult Urology for right hydronephrosis.  Patient has + nitrite on U/A. --Consult oncology team -- patient of Dr. Rogue Bussing.   Full consult note to follow.   Melvyn Neth, [place]

## 2017-03-14 NOTE — ED Notes (Signed)
Portable xray is at bedside

## 2017-03-15 ENCOUNTER — Inpatient Hospital Stay: Payer: 59 | Admitting: Anesthesiology

## 2017-03-15 ENCOUNTER — Inpatient Hospital Stay: Payer: 59

## 2017-03-15 ENCOUNTER — Encounter
Admission: EM | Disposition: A | Discharge status: Other Acute Inpatient Hospital | Payer: Self-pay | Source: Home / Self Care | Attending: Internal Medicine

## 2017-03-15 ENCOUNTER — Other Ambulatory Visit: Payer: 59 | Admitting: Urology

## 2017-03-15 DIAGNOSIS — N1330 Unspecified hydronephrosis: Secondary | ICD-10-CM

## 2017-03-15 DIAGNOSIS — N132 Hydronephrosis with renal and ureteral calculous obstruction: Secondary | ICD-10-CM

## 2017-03-15 DIAGNOSIS — D494 Neoplasm of unspecified behavior of bladder: Secondary | ICD-10-CM

## 2017-03-15 HISTORY — PX: CYSTOSCOPY WITH FULGERATION: SHX6638

## 2017-03-15 HISTORY — PX: CYSTOSCOPY W/ RETROGRADES: SHX1426

## 2017-03-15 HISTORY — PX: CYSTOSCOPY WITH STENT PLACEMENT: SHX5790

## 2017-03-15 HISTORY — PX: CYSTOSCOPY WITH BIOPSY: SHX5122

## 2017-03-15 LAB — COMPREHENSIVE METABOLIC PANEL
ALT: 17 U/L (ref 14–54)
AST: 26 U/L (ref 15–41)
Albumin: 3.2 g/dL — ABNORMAL LOW (ref 3.5–5.0)
Alkaline Phosphatase: 90 U/L (ref 38–126)
Anion gap: 7 (ref 5–15)
BILIRUBIN TOTAL: 0.7 mg/dL (ref 0.3–1.2)
BUN: 15 mg/dL (ref 6–20)
CO2: 29 mmol/L (ref 22–32)
Calcium: 8 mg/dL — ABNORMAL LOW (ref 8.9–10.3)
Chloride: 100 mmol/L — ABNORMAL LOW (ref 101–111)
Creatinine, Ser: 0.97 mg/dL (ref 0.44–1.00)
Glucose, Bld: 93 mg/dL (ref 65–99)
POTASSIUM: 3.5 mmol/L (ref 3.5–5.1)
Sodium: 136 mmol/L (ref 135–145)
TOTAL PROTEIN: 5.6 g/dL — AB (ref 6.5–8.1)

## 2017-03-15 LAB — CBC
HEMATOCRIT: 24.4 % — AB (ref 35.0–47.0)
HEMOGLOBIN: 8.4 g/dL — AB (ref 12.0–16.0)
MCH: 33.3 pg (ref 26.0–34.0)
MCHC: 34.2 g/dL (ref 32.0–36.0)
MCV: 97.2 fL (ref 80.0–100.0)
Platelets: 264 10*3/uL (ref 150–440)
RBC: 2.51 MIL/uL — ABNORMAL LOW (ref 3.80–5.20)
RDW: 17.4 % — ABNORMAL HIGH (ref 11.5–14.5)
WBC: 7.3 10*3/uL (ref 3.6–11.0)

## 2017-03-15 SURGERY — CYSTOSCOPY, WITH STENT INSERTION
Anesthesia: General | Laterality: Right

## 2017-03-15 MED ORDER — IOTHALAMATE MEGLUMINE 43 % IV SOLN
INTRAVENOUS | Status: DC | PRN
  Administered 2017-03-15: 7 mL

## 2017-03-15 MED ORDER — SUCCINYLCHOLINE CHLORIDE 20 MG/ML IJ SOLN
INTRAMUSCULAR | Status: AC
  Filled 2017-03-15: qty 1

## 2017-03-15 MED ORDER — PROPOFOL 10 MG/ML IV BOLUS
INTRAVENOUS | Status: DC | PRN
  Administered 2017-03-15: 140 mg via INTRAVENOUS

## 2017-03-15 MED ORDER — ONDANSETRON HCL 4 MG/2ML IJ SOLN: 4.0000 mg | Freq: Once | INTRAMUSCULAR | Status: DC | PRN

## 2017-03-15 MED ORDER — ONDANSETRON HCL 4 MG/2ML IJ SOLN
INTRAMUSCULAR | Status: DC | PRN
  Administered 2017-03-15: 4 mg via INTRAVENOUS

## 2017-03-15 MED ORDER — FENTANYL CITRATE (PF) 100 MCG/2ML IJ SOLN
INTRAMUSCULAR | Status: AC
  Filled 2017-03-15: qty 2

## 2017-03-15 MED ORDER — SUCCINYLCHOLINE CHLORIDE 20 MG/ML IJ SOLN
INTRAMUSCULAR | Status: DC | PRN
  Administered 2017-03-15: 80 mg via INTRAVENOUS

## 2017-03-15 MED ORDER — LIDOCAINE HCL (PF) 2 % IJ SOLN
INTRAMUSCULAR | Status: AC
  Filled 2017-03-15: qty 2

## 2017-03-15 MED ORDER — PROPOFOL 10 MG/ML IV BOLUS
INTRAVENOUS | Status: AC
  Filled 2017-03-15: qty 20

## 2017-03-15 MED ORDER — SODIUM CHLORIDE 0.9 % IR SOLN
Status: DC | PRN
  Administered 2017-03-15: 3000 mL

## 2017-03-15 MED ORDER — LIDOCAINE HCL (CARDIAC) 20 MG/ML IV SOLN
INTRAVENOUS | Status: DC | PRN
  Administered 2017-03-15: 100 mg via INTRAVENOUS

## 2017-03-15 MED ORDER — FENTANYL CITRATE (PF) 100 MCG/2ML IJ SOLN
INTRAMUSCULAR | Status: AC
  Administered 2017-03-15: 25 ug via INTRAVENOUS
  Filled 2017-03-15: qty 2

## 2017-03-15 MED ORDER — SODIUM CHLORIDE 0.9 % IV SOLN
INTRAVENOUS | Status: DC | PRN
  Administered 2017-03-15: 14:00:00 via INTRAVENOUS

## 2017-03-15 MED ORDER — STERILE WATER FOR IRRIGATION IR SOLN
Status: DC | PRN
  Administered 2017-03-15: 3000 mL

## 2017-03-15 MED ORDER — BELLADONNA ALKALOIDS-OPIUM 16.2-60 MG RE SUPP: 1.0000 | Freq: Three times a day (TID) | RECTAL | Status: DC | PRN

## 2017-03-15 MED ORDER — ONDANSETRON HCL 4 MG/2ML IJ SOLN
INTRAMUSCULAR | Status: AC
  Filled 2017-03-15: qty 2

## 2017-03-15 MED ORDER — FENTANYL CITRATE (PF) 100 MCG/2ML IJ SOLN
INTRAMUSCULAR | Status: DC | PRN
  Administered 2017-03-15 (×4): 50 ug via INTRAVENOUS

## 2017-03-15 MED ORDER — FENTANYL CITRATE (PF) 100 MCG/2ML IJ SOLN
25.0000 ug | INTRAMUSCULAR | Status: AC | PRN
  Administered 2017-03-15 (×6): 25 ug via INTRAVENOUS

## 2017-03-15 MED ORDER — PHENYLEPHRINE HCL 10 MG/ML IJ SOLN
INTRAMUSCULAR | Status: DC | PRN
  Administered 2017-03-15 (×2): 100 ug via INTRAVENOUS
  Administered 2017-03-15: 200 ug via INTRAVENOUS
  Administered 2017-03-15: 100 ug via INTRAVENOUS

## 2017-03-15 MED ORDER — DEXAMETHASONE SODIUM PHOSPHATE 10 MG/ML IJ SOLN
INTRAMUSCULAR | Status: AC
  Filled 2017-03-15: qty 1

## 2017-03-15 MED ORDER — DEXAMETHASONE SODIUM PHOSPHATE 10 MG/ML IJ SOLN
INTRAMUSCULAR | Status: DC | PRN
  Administered 2017-03-15: 5 mg via INTRAVENOUS

## 2017-03-15 SURGICAL SUPPLY — 26 items
BAG DRAIN CYSTO-URO LG1000N (MISCELLANEOUS) ×4 IMPLANT
BAG URINE DRAINAGE (UROLOGICAL SUPPLIES) ×4 IMPLANT
CATH FOL 2WAY LX 16X5 (CATHETERS) ×4 IMPLANT
CATH URETL 5X70 OPEN END (CATHETERS) ×4 IMPLANT
CONRAY 43 FOR UROLOGY 50M (MISCELLANEOUS) ×4 IMPLANT
ELECT REM PT RETURN 9FT ADLT (ELECTROSURGICAL) ×4
ELECTRODE REM PT RTRN 9FT ADLT (ELECTROSURGICAL) ×2 IMPLANT
GLIDEWIRE STIFF .35X180X3 HYDR (WIRE) ×4 IMPLANT
GLOVE BIO SURGEON STRL SZ 6.5 (GLOVE) ×3 IMPLANT
GLOVE BIO SURGEONS STRL SZ 6.5 (GLOVE) ×1
GOWN STRL REUS W/ TWL LRG LVL3 (GOWN DISPOSABLE) ×4 IMPLANT
GOWN STRL REUS W/TWL LRG LVL3 (GOWN DISPOSABLE) ×4
KIT RM TURNOVER CYSTO AR (KITS) ×4 IMPLANT
PACK CYSTO AR (MISCELLANEOUS) ×4 IMPLANT
SCRUB POVIDONE IODINE 4 OZ (MISCELLANEOUS) IMPLANT
SENSORWIRE 0.038 NOT ANGLED (WIRE) ×4
SET CYSTO W/LG BORE CLAMP LF (SET/KITS/TRAYS/PACK) ×4 IMPLANT
SOL .9 NS 3000ML IRR  AL (IV SOLUTION) ×2
SOL .9 NS 3000ML IRR UROMATIC (IV SOLUTION) ×2 IMPLANT
STENT URET 6FRX24 CONTOUR (STENTS) IMPLANT
STENT URET 6FRX26 CONTOUR (STENTS) IMPLANT
STENT URO INLAY 6FRX24CM (STENTS) ×4 IMPLANT
SURGILUBE 2OZ TUBE FLIPTOP (MISCELLANEOUS) ×4 IMPLANT
SYRINGE IRR TOOMEY STRL 70CC (SYRINGE) ×4 IMPLANT
WATER STERILE IRR 1000ML POUR (IV SOLUTION) ×4 IMPLANT
WIRE SENSOR 0.038 NOT ANGLED (WIRE) ×2 IMPLANT

## 2017-03-15 NOTE — Anesthesia Procedure Notes (Signed)
Procedure Name: Intubation Date/Time: 03/15/2017 1:56 PM Performed by: Aline Brochure Pre-anesthesia Checklist: Patient identified, Emergency Drugs available, Suction available and Patient being monitored Patient Re-evaluated:Patient Re-evaluated prior to inductionOxygen Delivery Method: Circle system utilized Preoxygenation: Pre-oxygenation with 100% oxygen Intubation Type: Cricoid Pressure applied and IV induction Ventilation: Mask ventilation without difficulty Laryngoscope Size: Mac and 3 Grade View: Grade II Tube type: Oral Tube size: 7.0 mm Number of attempts: 1 Airway Equipment and Method: Bougie stylet Placement Confirmation: ETT inserted through vocal cords under direct vision,  positive ETCO2 and breath sounds checked- equal and bilateral Secured at: 21 cm Tube secured with: Tape Dental Injury: Teeth and Oropharynx as per pre-operative assessment  Difficulty Due To: Difficult Airway- due to anterior larynx

## 2017-03-15 NOTE — Anesthesia Preprocedure Evaluation (Signed)
Anesthesia Evaluation  Patient identified by MRN, date of birth, ID band Patient awake    Reviewed: Allergy & Precautions, NPO status , Patient's Chart, lab work & pertinent test results  History of Anesthesia Complications Negative for: history of anesthetic complications  Airway Mallampati: II       Dental   Pulmonary neg pulmonary ROS,           Cardiovascular negative cardio ROS       Neuro/Psych negative neurological ROS     GI/Hepatic negative GI ROS, Neg liver ROS,   Endo/Other  negative endocrine ROS  Renal/GU Renal diseasehydronephrosis     Musculoskeletal   Abdominal   Peds  Hematology negative hematology ROS (+)   Anesthesia Other Findings   Reproductive/Obstetrics                             Anesthesia Physical Anesthesia Plan  ASA: II  Anesthesia Plan: General   Post-op Pain Management:    Induction: Intravenous  Airway Management Planned: Oral ETT  Additional Equipment:   Intra-op Plan:   Post-operative Plan:   Informed Consent: I have reviewed the patients History and Physical, chart, labs and discussed the procedure including the risks, benefits and alternatives for the proposed anesthesia with the patient or authorized representative who has indicated his/her understanding and acceptance.     Plan Discussed with:   Anesthesia Plan Comments:         Anesthesia Quick Evaluation

## 2017-03-15 NOTE — Op Note (Signed)
Date of procedure: 03/15/17  Preoperative diagnosis:  1. Right hydroureteronephrosis   Postoperative diagnosis:  1. Same as above 2. Bladder neck mass   Procedure: 1. Cystoscopy 2. Bladder neck/proximal urethral biopsy 3. Right retrograde pyelogram 4. Right ureteral stent placement  Surgeon: Hollice Espy, MD  Anesthesia: General  Complications: None  Intraoperative findings: Difficulty advancing 21 French cystoscope due to firm, fixed bladder neck with urethral stricture highly concerning for infiltrative malignant process. Right retrograde pyelogram with high-grade distal ureteral obstruction located approximately 3 cm from the UVJ with apple core-like appearance concerning for extrinsic ureteral mass.    EBL: Minimal  Specimens: Cold cup bladder neck/proximal urethral biopsies  Drains: 18 French Foley catheter, 6 x 24 French double-J Bard Optima ureteral stent  Indication: Sylvia Jones is a 61 y.o. patient with metastatic carcinoma of unknown known origin presenting with small bowel traction and progressive right hydroureteronephrosis.  After reviewing the management options for treatment, she elected to proceed with the above surgical procedure(s). We have discussed the potential benefits and risks of the procedure, side effects of the proposed treatment, the likelihood of the patient achieving the goals of the procedure, and any potential problems that might occur during the procedure or recuperation. Informed consent has been obtained.  Description of procedure:  The patient was taken to the operating room and general anesthesia was induced.  The patient was placed in the dorsal lithotomy position, prepped and draped in the usual sterile fashion, and preoperative antibiotics were administered. A preoperative time-out was performed.   I attempted to advance a 65 French cystoscope but met resistance within the proximal urethra and had difficulty advancing the scope. At  this point time, bimanual exam was performed which revealed a firm in for treatment of mass involving the anterior vaginal wall/bladder neck which was somewhat fixed and extending towards the right UVJ. This was highly concerning for an infiltrative malignant process. I was ultimately able to advance a 21 French cystoscope using a blunt obturator. The remainder of the bladder was slightly erythematous but otherwise unremarkable. The trigone was slightly more raised on the right side in the UO could be directly visualized. The bladder neck itself appeared to be fibrotic, fixed, and irregular. Photographs are taken. Attention was turned to the right ureteral orifice which was cannulated using a sensor wire just within the UO. The sensor wire did not advance beyond the UO easily and meeting resistance within the distal ureter highly concerning for obstruction. Retrograde pyelogram was then performed on this side which revealed an irregular strictured area personally 3 cm beyond the UVJ in the distal ureter with proximal hydroureteronephrosis concerning for high-grade obstruction. This had an apple core-like appearance concerning for disease outside of the ureter rather than intrinsic although it did not directly visualize the lumen of the ureter. I had initial difficulty advancing a sensor wire beyond the strictured area but was ultimately successful after attempting multiple maneuvers. Ultimately, I was able to advance a 6 x 24 French double-J ureteral stent over the sensor wire up to level of the renal pelvis. The wire was partially drawn until full coil was noted within the renal pelvis. The wire was then fully withdrawn and a full coil was noted within the bladder. Next, cold cup biopsy forceps were used to take multiple biopsies, approximate 5 of the floor of the bladder neck and just within the proximal urethra. These were passed off the field as bladder biopsies. Judicious pinpoint electrocautery was used to  fulgurate  these lesions using a Bugbee. The remainder of the bladder was again carefully inspected and no other obvious tumors or lesions were identified. The bladder was then drained. An 45 French Foley catheter was then inserted and the balloon was filled with 10 cc of sterile water. She was then cleaned and dried, repositioned supine position, reversed from anesthesia, taken to PACU in stable condition.  Plan: Findings were disclosed to the patient's husband and son. Follow-up pathology results. Case was discussed with her oncologist personally today as well. Maintain Foley catheter overnight. Belladonna and opium suppositories for bladder spasms.      Hollice Espy, M.D.

## 2017-03-15 NOTE — Progress Notes (Signed)
[facility] NAME: Emelie Newsom    MR#:  [id number]  DATE OF BIRTH:  03-06-1956  SUBJECTIVE:   Patient Reports decreased abdominal girth. No nausea this morning. She has an ingrown toenail  REVIEW OF SYSTEMS:    Review of Systems  Constitutional: Negative for fever, chills weight loss HENT: Negative for ear pain, nosebleeds, congestion, facial swelling, rhinorrhea, neck pain, neck stiffness and ear discharge.   Respiratory: Negative for cough, shortness of breath, wheezing  Cardiovascular: Negative for chest pain, palpitations and leg swelling.  Gastrointestinal: Negative for heartburn, Positive however improved abdominal pain, no vomiting, diarrhea or consitpation Genitourinary: Positive for dysuria, no urgency, frequency, hematuria Musculoskeletal: Negative for back pain or joint pain Neurological: Negative for dizziness, seizures, syncope, focal weakness,  numbness and headaches.  Hematological: Does not bruise/bleed easily.  Psychiatric/Behavioral: Negative for hallucinations, confusion, dysphoric mood    Tolerating Diet: npo      DRUG ALLERGIES:   Allergies  Allergen Reactions  . Sulfa Antibiotics Hives    VITALS:  Blood pressure 115/68, pulse 84, temperature 98.7 F (37.1 C), temperature source Oral, resp. rate 18, height 5\' 5"  (1.651 m), weight 53.3 kg (117 lb 9.6 oz), SpO2 99 %.  PHYSICAL EXAMINATION:  Constitutional: Appears well-developed and well-nourished. No distress.Has NG tube placed HENT: Normocephalic. Marland Kitchen Oropharynx is clear and moist.  Eyes: Conjunctivae and EOM are normal. PERRLA, no scleral icterus.  Neck: Normal ROM. Neck supple. No JVD. No tracheal deviation. CVS: RRR, S1/S2 +, no murmurs, no gallops, no carotid bruit.  Pulmonary: Effort and breath sounds normal, no stridor, rhonchi, wheezes, rales.  Abdominal: Soft. BS +,  positive distension, tenderness, no rebound or guarding.   Musculoskeletal: Normal range of motion. No edema and no tenderness.  Neuro: Alert. CN 2-12 grossly intact. No focal deficits. Skin: Skin is warm and dry. No rash noted. Psychiatric: Normal mood and affect.      LABORATORY PANEL:   CBC  Recent Labs Lab 03/15/17 0356  WBC 7.3  HGB 8.4*  HCT 24.4*  PLT 264   ------------------------------------------------------------------------------------------------------------------  Chemistries   Recent Labs Lab 03/15/17 0356  NA 136  K 3.5  CL 100*  CO2 29  GLUCOSE 93  BUN 15  CREATININE 0.97  CALCIUM 8.0*  AST 26  ALT 17  ALKPHOS 90  BILITOT 0.7   ------------------------------------------------------------------------------------------------------------------  Cardiac Enzymes  Recent Labs Lab 03/14/17 1030  TROPONINI 0.04*   ------------------------------------------------------------------------------------------------------------------  RADIOLOGY:  Dg Abd 1 View  Result Date: 03/15/2017 CLINICAL DATA:  61 year old female with small bowel obstruction, abnormal terminal ileum. Carcinoma of unknown primary, lytic bone lesions and metastatic lymphadenopathy including in the neck. EXAM: ABDOMEN - 1 VIEW COMPARISON:  CT Abdomen and Pelvis and radiographs 03/14/2017 and earlier. FINDINGS: KUB view of the abdomen at 0800 hours. Enteric tube side hole remains at the level of the proximal gastric body. There is some residual excreted IV contrast suspected in the right renal collecting system, as well as the right ureter and bladder. The left renal collecting system and ureter are no longer visible. Bowel gas pattern has not significantly changed since 03/14/2017 although the oral contrast administered at that time is now throughout the colon. Stable visualized osseous structures. IMPRESSION: 1. No significant change in the bowel gas pattern since the CT on 03/14/2017, but the oral contrast administered at that time is now  throughout the colon indicating a partial small bowel obstruction. 2. Abnormal persistence of IV  contrast in the right renal collecting system, right ureter and bladder. Suspect distal right ureter obstruction as on the recent CT. Consider cystoscopy for evaluation of the right UVJ. 3. Stable enteric tube. Electronically Signed   By: Genevie Ann M.D.   On: 03/15/2017 08:37   Dg Abdomen 1 View  Result Date: 03/14/2017 CLINICAL DATA:  Acute abdominal pain, concern for obstruction or free abdominal air EXAM: ABDOMEN - 1 VIEW COMPARISON:  None. FINDINGS: No definite evidence of free abdominal air. Gaseous distended bowel loops are noted mid and left lower abdomen with some air-fluid levels. Ileus or early bowel obstruction cannot be excluded. IMPRESSION: Gaseous distended bowel loops are noted mid and left lower abdomen with some air-fluid levels. Ileus or early bowel obstruction cannot be excluded. Electronically Signed   By: Lahoma Crocker M.D.   On: 03/14/2017 12:28   Ct Abdomen Pelvis W Contrast  Result Date: 03/14/2017 CLINICAL DATA:  Abdominal bloating, right upper quadrant pain and elevated white blood cell count. History of metastatic carcinoma of unknown primary. EXAM: CT ABDOMEN AND PELVIS WITH CONTRAST TECHNIQUE: Multidetector CT imaging of the abdomen and pelvis was performed using the standard protocol following bolus administration of intravenous contrast. CONTRAST:  75 ml ISOVUE-300 IOPAMIDOL (ISOVUE-300) INJECTION 61% COMPARISON:  CT chest, abdomen and pelvis 03/09/2017 and 01/29/2017. FINDINGS: Lower chest: No pleural or pericardial effusion. Small pleural base calcification right lower lobe noted. Mild dependent atelectasis. Fluid filled and dilated esophagus is noted. Hepatobiliary: Several small stones are seen in the neck of the gallbladder. No focal liver lesion. The biliary tree is unremarkable. Pancreas: Unremarkable. No pancreatic ductal dilatation or surrounding inflammatory changes. Spleen:  Normal in size without focal abnormality. Adrenals/Urinary Tract: There is severe right hydronephrosis which is worse than on the most recent comparison with an associated delayed nephrogram. The urinary bladder is incompletely distended with irregular thickening of its walls. The right ureter may be obstruction at the UVJ of the urinary bladder wall. No obstructing stone is seen. The left kidney appears normal. Adrenal glands are normal. Stomach/Bowel: The patient has a high grade small bowel obstruction. Transition point is at the terminal ileum. Walls of the terminal ileum are markedly an irregularly thickened. There is little to no surrounding inflammatory change. No pneumatosis or portal venous gas. No fatty proliferation of the right lower quadrant. The colon is nearly completely decompressed. The stomach is markedly distended with fluid. Vascular/Lymphatic: No lymphadenopathy. Reproductive: Uterus and bilateral adnexa are unremarkable. Other: Small volume of free pelvic fluid is noted. Musculoskeletal: No acute bony abnormality. No focal lesion is identified. IMPRESSION: High-grade small bowel obstruction with a transition point at the terminal ileum. Markedly thickened and irregular walls of the terminal ileum are identified and could be due to inflammatory change such as Crohn's disease but the appearance is also worrisome for mass lesion/small bowel neoplasm. Severe right hydronephrosis is worsened since the prior examination. No obstructing stone is identified. Obstruction may be at the right UVJ as the urinary bladder walls are markedly thickened and irregular. Bladder neoplasm or cystitis are possible. Gallstones without evidence cholecystitis. Electronically Signed   By: Inge Rise M.D.   On: 03/14/2017 13:59   Dg Abd Portable 1 View  Result Date: 03/14/2017 CLINICAL DATA:  NG tube placement. EXAM: PORTABLE ABDOMEN - 1 VIEW COMPARISON:  CT scan 03/14/2017 FINDINGS: The NG tube tip is in the  body region of the stomach. Persistent contrast noted in both collecting systems with right-sided hydroureteronephrosis. The lung bases  are grossly clear. IMPRESSION: The NG tube tip is in the body region of the stomach. Electronically Signed   By: Marijo Sanes M.D.   On: 03/14/2017 16:51     ASSESSMENT AND PLAN:   61 year old female with metastatic cancer of unknown primary who presented with abdominal pain and nausea and found to have small bowel obstruction.  1. Small bowel obstruction:  Appreciate surgical evaluation Continue NG tube to low suction Continue IV Zosyn for possible typhlitis   2. UTI: Continue Zosyn Follow up on cultures  3. Progressive right hydronephrosis: Plan for cystoscopy, right retrograde pyelogram and possible right ureteral stent Appreciate urology consult  4. Hypokalemia: Resolved with replacement   5. Anemia of chronic disease: No indication for blood transfusion  6. 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  CODE STATUS: full  TOTAL TIME TAKING CARE OF THIS PATIENT: 30 minutes.     POSSIBLE D/C 2-4 days, DEPENDING ON CLINICAL CONDITION.   Shaquella Stamant M.D on 03/15/2017 at 11:14 AM  Between 7am to 6pm - Pager - [phone] After 6pm go to [email or web address] - password [email or web address] [facility]  Office  [phone]  CC: Primary care physician; Rusty Aus, MD  Note: This dictation was prepared with Dragon dictation along with smaller phrase technology. Any transcriptional errors that result from this process are unintentional.

## 2017-03-15 NOTE — Anesthesia Post-op Follow-up Note (Cosign Needed)
Anesthesia QCDR form completed.        

## 2017-03-15 NOTE — Transfer of Care (Signed)
Immediate Anesthesia Transfer of Care Note  Patient: Sylvia Jones  Procedure(s) Performed: Procedure(s): CYSTOSCOPY WITH STENT PLACEMENT (Right)  Patient Location: PACU  Anesthesia Type:General  Level of Consciousness: awake, alert  and oriented  Airway & Oxygen Therapy: Patient connected to face mask oxygen  Post-op Assessment: Post -op Vital signs reviewed and stable  Post vital signs: stable  Last Vitals:  Vitals:   03/15/17 1257 03/15/17 1450  BP: 114/73 131/86  Pulse: 92 93  Resp: 18 13  Temp: 37.2 C 36.2 C    Last Pain:  Vitals:   03/15/17 1257  TempSrc: Oral  PainSc: 3       Patients Stated Pain Goal: 4 (47/09/62 8366)  Complications: No apparent anesthesia complications

## 2017-03-15 NOTE — Progress Notes (Signed)
03/15/2017  Subjective: Patient admitted yesterday with small bowel obstruction caused by significant inflammation of the terminal ileum. NG tube was placed with 1500 mL's evacuated right away. Patient reports that she is feeling much better from that standpoint with no nausea or vomiting and with improved abdominal pain. She reports this morning that her pain she feels is worse in the pelvis towards the bladder compared to her pain in the right lower quadrant. No flatus or bowel movement yet.  Vital signs: Temp:  [97.7 F (36.5 C)-98.7 F (37.1 C)] 98.7 F (37.1 C) (04/19 0836) Pulse Rate:  [84-116] 84 (04/19 0836) Resp:  [13-24] 18 (04/19 0836) BP: (110-136)/(57-89) 115/68 (04/19 0836) SpO2:  [93 %-99 %] 99 % (04/19 0836) Weight:  [53.3 kg (117 lb 9.6 oz)-54.4 kg (120 lb)] 53.3 kg (117 lb 9.6 oz) (04/19 0500)   Intake/Output: 04/18 0701 - 04/19 0700 In: 2706.3 [I.V.:1306.3; IV Piggyback:1400] Out: 300 [Urine:150; Emesis/NG output:150]    Physical Exam: Constitutional: No acute distress Abdomen:  Soft, mildly distended, with mild tenderness to palpation in the right lower quadrant and moderate tenderness in the low pelvis. NG tube in place connected to low intermittent suction.  Labs:   Recent Labs  03/14/17 1030 03/15/17 0356  WBC 11.2* 7.3  HGB 9.9* 8.4*  HCT 28.9* 24.4*  PLT 277 264    Recent Labs  03/14/17 1030 03/15/17 0356  NA 132* 136  K 3.3* 3.5  CL 91* 100*  CO2 30 29  GLUCOSE 145* 93  BUN 18 15  CREATININE 0.82 0.97  CALCIUM 9.6 8.0*   No results for input(s): LABPROT, INR in the last 72 hours.  Imaging: Dg Abd 1 View  Result Date: 03/15/2017 CLINICAL DATA:  61 year old female with small bowel obstruction, abnormal terminal ileum. Carcinoma of unknown primary, lytic bone lesions and metastatic lymphadenopathy including in the neck. EXAM: ABDOMEN - 1 VIEW COMPARISON:  CT Abdomen and Pelvis and radiographs 03/14/2017 and earlier. FINDINGS: KUB view of  the abdomen at 0800 hours. Enteric tube side hole remains at the level of the proximal gastric body. There is some residual excreted IV contrast suspected in the right renal collecting system, as well as the right ureter and bladder. The left renal collecting system and ureter are no longer visible. Bowel gas pattern has not significantly changed since 03/14/2017 although the oral contrast administered at that time is now throughout the colon. Stable visualized osseous structures. IMPRESSION: 1. No significant change in the bowel gas pattern since the CT on 03/14/2017, but the oral contrast administered at that time is now throughout the colon indicating a partial small bowel obstruction. 2. Abnormal persistence of IV contrast in the right renal collecting system, right ureter and bladder. Suspect distal right ureter obstruction as on the recent CT. Consider cystoscopy for evaluation of the right UVJ. 3. Stable enteric tube. Electronically Signed   By: Genevie Ann M.D.   On: 03/15/2017 08:37     Assessment/Plan: 61 year old female with metastatic cancer of unknown primary, here with small bowel obstruction episode due to significant inflammation of the terminal ileum.  -Continue NG tube to low suction, awaiting return of bowel function. -Continue IV antibiotics possible typhlitis and possible UTI based on urinalysis yesterday. -Urology is currently involved for her right sided hydronephrosis. Unclear if this is related to her inflammation in the terminal ileum as well. Awaiting their recommendations. -We'll continue following along with you. No surgical needs at this point.   Melvyn Neth, MD  [facility]

## 2017-03-15 NOTE — Consult Note (Signed)
Urology Consult  I have been asked to see the patient by Dr. Darvin Neighbours, for evaluation and management of right hydronephrosis.  Chief Complaint: nausea/ vomiting  History of Present Illness: Sylvia Jones is a 61 y.o. 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 Rogue Bussing most recently on cytoxan and adriamycin admitted with high-grade bowel obstruction after presenting with progressive nausea and vomiting.  She is being managed conservatively with an NG tube for bowel decompression in the inpatient setting.  She was referred to urology earlier this week and was scheduled to be evaluated in clinic today for newly identified right hydroureteronephrosis seen on CT scan on 03/09/2017 which was not previously appreciated.  Additionally, she was noted to have nonspecific bladder wall thickening. She underwent repeat scan yesterday which showed progression of this. Also, she was noted to have significant inflammation, possibly infiltration of her terminal ileum of unclear etiology.   Her creatinine is also risen since 03/05/2017 from 0.35 which appears to be her baseline up to 0.97 today.  She does report that she has had burning pain in her bladder and difficulty urinating, voiding frequently with small amounts. This has been going on for about 10 days.  She is been treated for presumed urinary tract infections although her UA and urine cultures have been completely negative.  She does report that on Friday, she did have some right flank pain which resolved spontaneously and has not recurred. Otherwise, she has no flank pain, gross hematuria, or any other issues other than the aforementioned.    Past Medical History:  Diagnosis Date  . Arthritis   . Cancer ([place])    lymph nodes, in process of diagnosing  . Cervical disc disease    upper cervical, denied numbness / tingling BUE  . Cervical stenosis of spine   .  Fracture 04/2016   left pubis bone-atypical   . Lymphadenopathy 09/05/2016   R clavical region  . Neuromuscular disorder ([place])    compressed disc in neck when 32 or 61 years old  . Post-menopausal 2011  . Primary cancer of unknown site [facility])   . Tubular adenoma     Past Surgical History:  Procedure Laterality Date  . APPENDECTOMY  1986  . BREAST BIOPSY Right    at age 80  . CESAREAN SECTION  1988  . CESAREAN SECTION  1988  . COLONOSCOPY WITH PROPOFOL N/A 07/12/2015   Procedure: COLONOSCOPY WITH PROPOFOL;  Surgeon: Manya Silvas, MD;  Location: [facility] ENDOSCOPY;  Service: Endoscopy;  Laterality: N/A;  . EXPLORATORY LAPAROTOMY  1986  . LYMPH GLAND EXCISION    . MASS BIOPSY N/A 09/05/2016   Procedure: NECK MASS BIOPSY;  Surgeon: Beverly Gust, MD;  Location: [facility] ORS;  Service: ENT;  Laterality: N/A;  . MASS EXCISION  1986   to remove mass outside the uterus. procedure was combined tacked uterus, reposition of fallopian tubes   . PORTACATH PLACEMENT Left 09/20/2016   Procedure: INSERTION PORT-A-CATH;  Surgeon: Olean Ree, MD;  Location: [facility] ORS;  Service: General;  Laterality: Left;  . THORACOTOMY Right 03/2008   RLL Wedge Resection- Dr. Genevive Bi    Home Medications:  Current Meds  Medication Sig  . acetaminophen (TYLENOL) 500 MG tablet Take 1,000 mg by mouth daily as needed for moderate pain.  . Calcium Carb-Cholecalciferol (CALCIUM 600+D3 PO) Take 1 Dose by mouth daily.   . ciprofloxacin (CIPRO) 500 MG tablet Take 500  mg by mouth 2 (two) times daily.  Marland Kitchen gabapentin (NEURONTIN) 100 MG capsule Take 100 mg by mouth 3 (three) times daily.  Marland Kitchen lactulose (CEPHULAC) 10 g packet Take 10 grams three times daily for the next 2 days. Then 10 grams daily, can increase to 10 grams three times a day for future concerns for worsening constipation  . lidocaine-prilocaine (EMLA) cream Apply 1 application topically as needed. Apply generously over the Mediport 45 minutes prior to chemotherapy.    . naproxen sodium (ANAPROX) 220 MG tablet Take 220 mg by mouth as needed. As needed for pain  . ondansetron (ZOFRAN) 8 MG tablet Take 8 mg by mouth every 8 (eight) hours as needed for nausea or vomiting.  . phenazopyridine (PYRIDIUM) 100 MG tablet Take 1 tablet (100 mg total) by mouth 3 (three) times daily as needed for pain (bladder spasms).  . polyethylene glycol (MIRALAX / GLYCOLAX) packet Take 17 g by mouth daily.  . potassium chloride SA (K-DUR,KLOR-CON) 20 MEQ tablet 1 pill twice a day x1 week. (Patient taking differently: Take 20 mEq by mouth daily. )  . prochlorperazine (COMPAZINE) 10 MG tablet Take 10 mg by mouth every 6 (six) hours as needed for nausea or vomiting.  . Sennosides (SENNA LAX PO) Take 1 tablet by mouth daily as needed.   . traMADol (ULTRAM) 50 MG tablet Take 1 tablet (50 mg total) by mouth every 6 (six) hours as needed.  . [DISCONTINUED] HYDROcodone-acetaminophen (NORCO/VICODIN) 5-325 MG tablet Take 1 tablet by mouth every 6 (six) hours as needed for moderate pain.    Allergies:  Allergies  Allergen Reactions  . Sulfa Antibiotics Hives    Family History  Problem Relation Age of Onset  . Cancer Father     medistinal  carcinoma  . Heart attack Father   . Hypertension Brother   . Diabetes Maternal Grandmother   . Diabetes Paternal Grandmother   . Cancer Brother     Capa C sarcoma    Social History:  reports that she has never smoked. She has never used smokeless tobacco. She reports that she does not drink alcohol or use drugs.  ROS: A complete review of systems was performed.  All systems are negative except for pertinent findings as noted.  Physical Exam:  Vital signs in last 24 hours: Temp:  [97.7 F (36.5 C)-98.4 F (36.9 C)] 98.4 F (36.9 C) (04/19 0344) Pulse Rate:  [90-116] 95 (04/19 0344) Resp:  [13-24] 20 (04/19 0344) BP: (110-136)/(57-89) 110/62 (04/19 0344) SpO2:  [93 %-99 %] 96 % (04/19 0344) Weight:  [117 lb 9.6 oz (53.3 kg)-120 lb (54.4  kg)] 117 lb 9.6 oz (53.3 kg) (04/19 0500) Constitutional:  Alert and oriented, No acute distress.  Husband at bedside. HEENT: [place] AT, moist mucus membranes.  Trachea midline, no masses Cardiovascular: Regular rate and rhythm, no clubbing, cyanosis, or edema. Respiratory: Normal respiratory effort, lungs clear bilaterally GI: Abdomen is soft, mildly distended with mild tenderness in the right lower quadrant without rebound or guarding.  G-tube in place on suction. GU: No CVA tenderness Skin: No rashes, bruises or suspicious lesions Neurologic: Grossly intact, no focal deficits, moving all 4 extremities Psychiatric: Normal mood and affect   Laboratory Data:   Recent Labs  03/14/17 1030 03/15/17 0356  WBC 11.2* 7.3  HGB 9.9* 8.4*  HCT 28.9* 24.4*    Recent Labs  03/14/17 1030 03/15/17 0356  NA 132* 136  K 3.3* 3.5  CL 91* 100*  CO2 30  29  GLUCOSE 145* 93  BUN 18 15  CREATININE 0.82 0.97  CALCIUM 9.6 8.0*   No results for input(s): LABPT, INR in the last 72 hours. No results for input(s): LABURIN in the last 72 hours. Results for orders placed or performed in visit on 03/05/17  Urine culture     Status: Abnormal   Collection Time: 03/05/17 12:50 PM  Result Value Ref Range Status   Specimen Description URINE, CLEAN CATCH  Final   Special Requests NONE  Final   Culture (A)  Final    <10,000 COLONIES/mL INSIGNIFICANT GROWTH Performed at [facility], [address], [place], [place] [address]    Report Status 03/06/2017 FINAL  Final   Component     Latest Ref Rng & Units 03/14/2017  Color, Urine     YELLOW AMBER (A)  Appearance     CLEAR CLEAR (A)  Specific Gravity, Urine     1.005 - 1.030 1.035 (H)  pH     5.0 - 8.0 5.0  Glucose     NEGATIVE mg/dL NEGATIVE  Hgb urine dipstick     NEGATIVE NEGATIVE  Bilirubin Urine     NEGATIVE NEGATIVE  Ketones, ur     NEGATIVE mg/dL NEGATIVE  Protein     NEGATIVE mg/dL NEGATIVE  Nitrite     NEGATIVE POSITIVE (A)    Leukocytes, UA     NEGATIVE NEGATIVE  RBC / HPF     0 - 5 RBC/hpf 0-5  WBC, UA     0 - 5 WBC/hpf 0-5  Bacteria, UA     NONE SEEN NONE SEEN  Squamous Epithelial / LPF     NONE SEEN 6-30 (A)  Mucous      PRESENT  Non Squamous Epithelial     NONE SEEN 0-5 (A)     Radiologic Imaging: Dg Abdomen 1 View  Result Date: 03/14/2017 CLINICAL DATA:  Acute abdominal pain, concern for obstruction or free abdominal air EXAM: ABDOMEN - 1 VIEW COMPARISON:  None. FINDINGS: No definite evidence of free abdominal air. Gaseous distended bowel loops are noted mid and left lower abdomen with some air-fluid levels. Ileus or early bowel obstruction cannot be excluded. IMPRESSION: Gaseous distended bowel loops are noted mid and left lower abdomen with some air-fluid levels. Ileus or early bowel obstruction cannot be excluded. Electronically Signed   By: Lahoma Crocker M.D.   On: 03/14/2017 12:28   Ct Abdomen Pelvis W Contrast  Result Date: 03/14/2017 CLINICAL DATA:  Abdominal bloating, right upper quadrant pain and elevated white blood cell count. History of metastatic carcinoma of unknown primary. EXAM: CT ABDOMEN AND PELVIS WITH CONTRAST TECHNIQUE: Multidetector CT imaging of the abdomen and pelvis was performed using the standard protocol following bolus administration of intravenous contrast. CONTRAST:  75 ml ISOVUE-300 IOPAMIDOL (ISOVUE-300) INJECTION 61% COMPARISON:  CT chest, abdomen and pelvis 03/09/2017 and 01/29/2017. FINDINGS: Lower chest: No pleural or pericardial effusion. Small pleural base calcification right lower lobe noted. Mild dependent atelectasis. Fluid filled and dilated esophagus is noted. Hepatobiliary: Several small stones are seen in the neck of the gallbladder. No focal liver lesion. The biliary tree is unremarkable. Pancreas: Unremarkable. No pancreatic ductal dilatation or surrounding inflammatory changes. Spleen: Normal in size without focal abnormality. Adrenals/Urinary Tract: There is  severe right hydronephrosis which is worse than on the most recent comparison with an associated delayed nephrogram. The urinary bladder is incompletely distended with irregular thickening of its walls. The right ureter may  be obstruction at the UVJ of the urinary bladder wall. No obstructing stone is seen. The left kidney appears normal. Adrenal glands are normal. Stomach/Bowel: The patient has a high grade small bowel obstruction. Transition point is at the terminal ileum. Walls of the terminal ileum are markedly an irregularly thickened. There is little to no surrounding inflammatory change. No pneumatosis or portal venous gas. No fatty proliferation of the right lower quadrant. The colon is nearly completely decompressed. The stomach is markedly distended with fluid. Vascular/Lymphatic: No lymphadenopathy. Reproductive: Uterus and bilateral adnexa are unremarkable. Other: Small volume of free pelvic fluid is noted. Musculoskeletal: No acute bony abnormality. No focal lesion is identified. IMPRESSION: High-grade small bowel obstruction with a transition point at the terminal ileum. Markedly thickened and irregular walls of the terminal ileum are identified and could be due to inflammatory change such as Crohn's disease but the appearance is also worrisome for mass lesion/small bowel neoplasm. Severe right hydronephrosis is worsened since the prior examination. No obstructing stone is identified. Obstruction may be at the right UVJ as the urinary bladder walls are markedly thickened and irregular. Bladder neoplasm or cystitis are possible. Gallstones without evidence cholecystitis. Electronically Signed   By: Inge Rise M.D.   On: 03/14/2017 13:59   Dg Abd Portable 1 View  Result Date: 03/14/2017 CLINICAL DATA:  NG tube placement. EXAM: PORTABLE ABDOMEN - 1 VIEW COMPARISON:  CT scan 03/14/2017 FINDINGS: The NG tube tip is in the body region of the stomach. Persistent contrast noted in both collecting  systems with right-sided hydroureteronephrosis. The lung bases are grossly clear. IMPRESSION: The NG tube tip is in the body region of the stomach. Electronically Signed   By: Marijo Sanes M.D.   On: 03/14/2017 16:51   CT scan was reviewed personally today and compared to previous scans.  Impression/Assessment:   61 year old female with metastatic carcinoma of unknown primary presenting with high-grade small bowel obstruction being managed conservatively, progressive right hydroureteronephrosis, bladder wall thickening with a rising creatinine.    I suspect either extrinsic compression of the ureter or functional hydroureteronephrosis from the inflammatory process in the right lower quadrant as a cause of her hydroureteronephrosis.  Bladder wall thickening, urinary symptoms possibly chemical cystitis related to cytoxan therapy.    Plan:  - We discussed seating to the operating room today for cystoscopy, right retrograde pyelogram, and possible right ureteral stent based on findings for further diagnostic workup. If any bladder lesion is identified or filling defect within the ureter, may consider biopsies at that time. We discussed the risk and benefits in detail including the risk of bleeding, infection, damage to surrounding structures, stent irritation, need for further procedures, amongst others. All questions were answered. She is currently nothing by mouth. -Consider MESNA administration with subsequent cyclophosphamide treatments -Consent signed today   03/15/2017, 7:50 AM  Hollice Espy,  MD

## 2017-03-16 ENCOUNTER — Telehealth: Payer: Self-pay | Admitting: Internal Medicine

## 2017-03-16 ENCOUNTER — Encounter: Payer: Self-pay | Admitting: Urology

## 2017-03-16 DIAGNOSIS — K56699 Other intestinal obstruction unspecified as to partial versus complete obstruction: Secondary | ICD-10-CM

## 2017-03-16 DIAGNOSIS — Q6211 Congenital occlusion of ureteropelvic junction: Secondary | ICD-10-CM

## 2017-03-16 DIAGNOSIS — N201 Calculus of ureter: Secondary | ICD-10-CM

## 2017-03-16 DIAGNOSIS — K922 Gastrointestinal hemorrhage, unspecified: Secondary | ICD-10-CM

## 2017-03-16 DIAGNOSIS — R1011 Right upper quadrant pain: Secondary | ICD-10-CM

## 2017-03-16 LAB — HEMOGLOBIN AND HEMATOCRIT, BLOOD
HCT: 29.2 % — ABNORMAL LOW (ref 35.0–47.0)
HEMOGLOBIN: 9.8 g/dL — AB (ref 12.0–16.0)

## 2017-03-16 LAB — CBC
HEMATOCRIT: 23.1 % — AB (ref 35.0–47.0)
HEMOGLOBIN: 7.8 g/dL — AB (ref 12.0–16.0)
MCH: 32.9 pg (ref 26.0–34.0)
MCHC: 33.6 g/dL (ref 32.0–36.0)
MCV: 98.1 fL (ref 80.0–100.0)
Platelets: 263 10*3/uL (ref 150–440)
RBC: 2.36 MIL/uL — ABNORMAL LOW (ref 3.80–5.20)
RDW: 18.4 % — AB (ref 11.5–14.5)
WBC: 9.7 10*3/uL (ref 3.6–11.0)

## 2017-03-16 LAB — BASIC METABOLIC PANEL
Anion gap: 8 (ref 5–15)
BUN: 13 mg/dL (ref 6–20)
CHLORIDE: 106 mmol/L (ref 101–111)
CO2: 26 mmol/L (ref 22–32)
Calcium: 7.7 mg/dL — ABNORMAL LOW (ref 8.9–10.3)
Creatinine, Ser: 0.42 mg/dL — ABNORMAL LOW (ref 0.44–1.00)
GFR calc Af Amer: >60 mL/min (ref >60)
GFR calc non Af Amer: >60 mL/min (ref >60)
GLUCOSE: 87 mg/dL (ref 65–99)
POTASSIUM: 3.7 mmol/L (ref 3.5–5.1)
SODIUM: 140 mmol/L (ref 135–145)

## 2017-03-16 LAB — ABO/RH: ABO/RH(D): O POS

## 2017-03-16 LAB — SURGICAL PATHOLOGY

## 2017-03-16 LAB — PREPARE RBC (CROSSMATCH)

## 2017-03-16 MED ORDER — PANTOPRAZOLE SODIUM 40 MG IV SOLR: 40.0000 mg | Freq: Two times a day (BID) | INTRAVENOUS | Status: DC

## 2017-03-16 MED ORDER — MAGIC MOUTHWASH: 10.0000 mL | Freq: Three times a day (TID) | ORAL | Status: DC | PRN

## 2017-03-16 MED ORDER — SODIUM CHLORIDE 0.9 % IV SOLN
80.0000 mg | Freq: Once | INTRAVENOUS | Status: AC
  Administered 2017-03-16: 15:00:00 80 mg via INTRAVENOUS
  Filled 2017-03-16: qty 80

## 2017-03-16 MED ORDER — BELLADONNA ALKALOIDS-OPIUM 16.2-60 MG RE SUPP: 1.0000 | Freq: Three times a day (TID) | RECTAL | Status: DC | PRN

## 2017-03-16 MED ORDER — SODIUM CHLORIDE 0.9 % IV SOLN
Freq: Once | INTRAVENOUS | Status: AC
  Administered 2017-03-16: 16:00:00 via INTRAVENOUS

## 2017-03-16 MED ORDER — SODIUM CHLORIDE 0.9% FLUSH: 10.0000 mL | INTRAVENOUS | Status: DC | PRN

## 2017-03-16 MED ORDER — SODIUM CHLORIDE 0.9% FLUSH: 10.0000 mL | Freq: Two times a day (BID) | INTRAVENOUS | Status: DC

## 2017-03-16 MED ORDER — SODIUM CHLORIDE 0.9 % IV SOLN
8.0000 mg/h | INTRAVENOUS | Status: DC
  Administered 2017-03-16 (×2): 8 mg/h via INTRAVENOUS
  Filled 2017-03-16: qty 80

## 2017-03-16 NOTE — Anesthesia Postprocedure Evaluation (Signed)
Anesthesia Post Note  Patient: Sylvia Jones  Procedure(s) Performed: Procedure(s) (LRB): CYSTOSCOPY WITH STENT PLACEMENT (Right) CYSTOSCOPY WITH RETROGRADE PYELOGRAM CYSTOSCOPY WITH FULGERATION CYSTOSCOPY WITH BIOPSY  Patient location during evaluation: PACU Anesthesia Type: General Level of consciousness: awake and alert Pain management: pain level controlled Vital Signs Assessment: post-procedure vital signs reviewed and stable Respiratory status: spontaneous breathing and respiratory function stable Cardiovascular status: stable Anesthetic complications: no     Last Vitals:  Vitals:   03/15/17 1604 03/16/17 0351  BP: 133/72 122/74  Pulse: 94 92  Resp: 18 18  Temp: 36.6 C 36.7 C    Last Pain:  Vitals:   03/16/17 0548  TempSrc:   PainSc: 4                  KEPHART,WILLIAM K

## 2017-03-16 NOTE — Progress Notes (Signed)
Spoke with Dr Marcille Blanco about brown, clear drainage with small amount of stringly, thin clots from NGT, Hgb 7.8 this morning.  VSS.  Continue to monitor. Dorna Bloom RN

## 2017-03-16 NOTE — Progress Notes (Signed)
Verbal order from Dr. Hampton Abbot for patient to have ice chips for comfort.  Clarise Cruz, RN

## 2017-03-16 NOTE — Progress Notes (Signed)
Urology Consult Follow Up  Subjective: Foley draining pink tinged urine, no clots. Discussion this morning about intraoperative findings, stent placement. Creatinine is returned to her baseline.  Anti-infectives: Anti-infectives    Start     Dose/Rate Route Frequency Ordered Stop   03/15/17 0600  ciprofloxacin (CIPRO) IVPB 400 mg  Status:  Discontinued     400 mg 200 mL/hr over 60 Minutes Intravenous Every 12 hours 03/14/17 1555 03/14/17 1818   03/15/17 0100  metroNIDAZOLE (FLAGYL) IVPB 500 mg  Status:  Discontinued     500 mg 100 mL/hr over 60 Minutes Intravenous Every 8 hours 03/14/17 1555 03/14/17 1818   03/14/17 2200  piperacillin-tazobactam (ZOSYN) IVPB 3.375 g     3.375 g 12.5 mL/hr over 240 Minutes Intravenous Every 8 hours 03/14/17 1825     03/14/17 1515  metroNIDAZOLE (FLAGYL) IVPB 500 mg     500 mg 100 mL/hr over 60 Minutes Intravenous  Once 03/14/17 1505 03/14/17 1848   03/14/17 1515  ciprofloxacin (CIPRO) IVPB 400 mg     400 mg 200 mL/hr over 60 Minutes Intravenous  Once 03/14/17 1505 03/14/17 1708      Current Facility-Administered Medications  Medication Dose Route Frequency Provider Last Rate Last Dose  . 0.9 %  sodium chloride infusion   Intravenous Once Sital Mody, MD      . 0.9 % NaCl with KCl 20 mEq/ L  infusion   Intravenous Continuous Hillary Bow, MD 125 mL/hr at 03/16/17 0350    . acetaminophen (TYLENOL) tablet 650 mg  650 mg Oral Q6H PRN Hillary Bow, MD       Or  . acetaminophen (TYLENOL) suppository 650 mg  650 mg Rectal Q6H PRN Srikar Sudini, MD      . enoxaparin (LOVENOX) injection 40 mg  40 mg Subcutaneous Q24H Hillary Bow, MD   40 mg at 03/15/17 2145  . fentaNYL (SUBLIMAZE) injection 100 mcg  100 mcg Intravenous Q1H PRN Merlyn Lot, MD   100 mcg at 03/14/17 1817  . morphine 2 MG/ML injection 2 mg  2 mg Intravenous Q4H PRN Hillary Bow, MD   2 mg at 03/16/17 0524  . ondansetron (ZOFRAN) tablet 4 mg  4 mg Oral Q6H PRN Hillary Bow, MD        Or  . ondansetron (ZOFRAN) injection 4 mg  4 mg Intravenous Q6H PRN Srikar Sudini, MD      . opium-belladonna (B&O SUPPRETTES) 16.2-60 MG suppository 1 suppository  1 suppository Rectal Q8H PRN Hollice Espy, MD      . pantoprazole (PROTONIX) injection 40 mg  40 mg Intravenous Q12H Sital Mody, MD      . phenol (CHLORASEPTIC) mouth spray 1 spray  1 spray Mouth/Throat PRN Hillary Bow, MD   1 spray at 03/14/17 2141  . piperacillin-tazobactam (ZOSYN) IVPB 3.375 g  3.375 g Intravenous Q8H Hillary Bow, MD   Stopped at 03/16/17 8921   Facility-Administered Medications Ordered in Other Encounters  Medication Dose Route Frequency Provider Last Rate Last Dose  . heparin lock flush 100 unit/mL  500 Units Intravenous Once Cammie Sickle, MD         Objective: Vital signs in last 24 hours: Temp:  [97.2 F (36.2 C)-98.9 F (37.2 C)] 98.3 F (36.8 C) (04/20 0749) Pulse Rate:  [81-94] 90 (04/20 0749) Resp:  [11-22] 18 (04/20 0351) BP: (112-144)/(63-126) 112/63 (04/20 0749) SpO2:  [96 %-100 %] 96 % (04/20 0749)  Intake/Output from previous day: 04/19 0701 - 04/20 0700  In: 3231.3 [P.O.:1850; I.V.:1281.3; IV Piggyback:100] Out: 2650 [Urine:2100; Emesis/NG output:550] Intake/Output this shift: Total I/O In: -  Out: 400 [Urine:400]   Physical Exam  Constitutional: She is well-developed, well-nourished, and in no distress.  Husband at bedside  HENT:  Head: Normocephalic.  Pulmonary/Chest: Effort normal. No respiratory distress.  Abdominal: Soft. She exhibits distension.  Genitourinary:  Genitourinary Comments: Foley draining light pink urine without clots  Musculoskeletal: She exhibits no edema.  Skin: Skin is warm and dry.    Lab Results:   Recent Labs  03/15/17 0356 03/16/17 0400  WBC 7.3 9.7  HGB 8.4* 7.8*  HCT 24.4* 23.1*  PLT 264 263   BMET  Recent Labs  03/15/17 0356 03/16/17 0400  NA 136 140  K 3.5 3.7  CL 100* 106  CO2 29 26  GLUCOSE 93 87  BUN 15 13   CREATININE 0.97 0.42*  CALCIUM 8.0* 7.7*   PT/INR No results for input(s): LABPROT, INR in the last 72 hours. ABG No results for input(s): PHART, HCO3 in the last 72 hours.  Invalid input(s): PCO2, PO2  Studies/Results: Dg Abd 1 View  Result Date: 03/15/2017 CLINICAL DATA:  Right ureteral stent placement. EXAM: ABDOMEN - 1 VIEW; DG C-ARM 61-120 MIN COMPARISON:  Previous relevant examinations. FINDINGS: Three C-arm views of the right pelvis and abdomen are submitted for interpretation. These demonstrate retrograde opacification of the right ureter. There is mild irregularity and marked narrowing of the distal ureter. Subsequent images demonstrate a the right ureteral stent in satisfactory position with persistent contrast in the right ureter above the level of irregularity and narrowing. IMPRESSION: Irregularity and narrowing of the distal right ureter, suspicious for the possibility of a neoplasm involving the distal ureter. Subsequent placement of a right ureteral stent in satisfactory position. Electronically Signed   By: Claudie Revering M.D.   On: 03/15/2017 15:16   Dg Abd 1 View  Result Date: 03/15/2017 CLINICAL DATA:  61 year old female with small bowel obstruction, abnormal terminal ileum. Carcinoma of unknown primary, lytic bone lesions and metastatic lymphadenopathy including in the neck. EXAM: ABDOMEN - 1 VIEW COMPARISON:  CT Abdomen and Pelvis and radiographs 03/14/2017 and earlier. FINDINGS: KUB view of the abdomen at 0800 hours. Enteric tube side hole remains at the level of the proximal gastric body. There is some residual excreted IV contrast suspected in the right renal collecting system, as well as the right ureter and bladder. The left renal collecting system and ureter are no longer visible. Bowel gas pattern has not significantly changed since 03/14/2017 although the oral contrast administered at that time is now throughout the colon. Stable visualized osseous structures.  IMPRESSION: 1. No significant change in the bowel gas pattern since the CT on 03/14/2017, but the oral contrast administered at that time is now throughout the colon indicating a partial small bowel obstruction. 2. Abnormal persistence of IV contrast in the right renal collecting system, right ureter and bladder. Suspect distal right ureter obstruction as on the recent CT. Consider cystoscopy for evaluation of the right UVJ. 3. Stable enteric tube. Electronically Signed   By: Genevie Ann M.D.   On: 03/15/2017 08:37   Ct Abdomen Pelvis W Contrast  Result Date: 03/14/2017 CLINICAL DATA:  Abdominal bloating, right upper quadrant pain and elevated white blood cell count. History of metastatic carcinoma of unknown primary. EXAM: CT ABDOMEN AND PELVIS WITH CONTRAST TECHNIQUE: Multidetector CT imaging of the abdomen and pelvis was performed using the standard protocol following bolus administration of  intravenous contrast. CONTRAST:  75 ml ISOVUE-300 IOPAMIDOL (ISOVUE-300) INJECTION 61% COMPARISON:  CT chest, abdomen and pelvis 03/09/2017 and 01/29/2017. FINDINGS: Lower chest: No pleural or pericardial effusion. Small pleural base calcification right lower lobe noted. Mild dependent atelectasis. Fluid filled and dilated esophagus is noted. Hepatobiliary: Several small stones are seen in the neck of the gallbladder. No focal liver lesion. The biliary tree is unremarkable. Pancreas: Unremarkable. No pancreatic ductal dilatation or surrounding inflammatory changes. Spleen: Normal in size without focal abnormality. Adrenals/Urinary Tract: There is severe right hydronephrosis which is worse than on the most recent comparison with an associated delayed nephrogram. The urinary bladder is incompletely distended with irregular thickening of its walls. The right ureter may be obstruction at the UVJ of the urinary bladder wall. No obstructing stone is seen. The left kidney appears normal. Adrenal glands are normal. Stomach/Bowel: The  patient has a high grade small bowel obstruction. Transition point is at the terminal ileum. Walls of the terminal ileum are markedly an irregularly thickened. There is little to no surrounding inflammatory change. No pneumatosis or portal venous gas. No fatty proliferation of the right lower quadrant. The colon is nearly completely decompressed. The stomach is markedly distended with fluid. Vascular/Lymphatic: No lymphadenopathy. Reproductive: Uterus and bilateral adnexa are unremarkable. Other: Small volume of free pelvic fluid is noted. Musculoskeletal: No acute bony abnormality. No focal lesion is identified. IMPRESSION: High-grade small bowel obstruction with a transition point at the terminal ileum. Markedly thickened and irregular walls of the terminal ileum are identified and could be due to inflammatory change such as Crohn's disease but the appearance is also worrisome for mass lesion/small bowel neoplasm. Severe right hydronephrosis is worsened since the prior examination. No obstructing stone is identified. Obstruction may be at the right UVJ as the urinary bladder walls are markedly thickened and irregular. Bladder neoplasm or cystitis are possible. Gallstones without evidence cholecystitis. Electronically Signed   By: Inge Rise M.D.   On: 03/14/2017 13:59   Dg Abd Portable 1 View  Result Date: 03/14/2017 CLINICAL DATA:  NG tube placement. EXAM: PORTABLE ABDOMEN - 1 VIEW COMPARISON:  CT scan 03/14/2017 FINDINGS: The NG tube tip is in the body region of the stomach. Persistent contrast noted in both collecting systems with right-sided hydroureteronephrosis. The lung bases are grossly clear. IMPRESSION: The NG tube tip is in the body region of the stomach. Electronically Signed   By: Marijo Sanes M.D.   On: 03/14/2017 16:51   Dg C-arm 1-60 Min  Result Date: 03/15/2017 CLINICAL DATA:  Right ureteral stent placement. EXAM: ABDOMEN - 1 VIEW; DG C-ARM 61-120 MIN COMPARISON:  Previous relevant  examinations. FINDINGS: Three C-arm views of the right pelvis and abdomen are submitted for interpretation. These demonstrate retrograde opacification of the right ureter. There is mild irregularity and marked narrowing of the distal ureter. Subsequent images demonstrate a the right ureteral stent in satisfactory position with persistent contrast in the right ureter above the level of irregularity and narrowing. IMPRESSION: Irregularity and narrowing of the distal right ureter, suspicious for the possibility of a neoplasm involving the distal ureter. Subsequent placement of a right ureteral stent in satisfactory position. Electronically Signed   By: Claudie Revering M.D.   On: 03/15/2017 15:16     Assessment: 61 year old female with metastatic carcinoma of unknown primary found to have high-grade right distal ureteral obstruction concerning for malignant obstruction as well as abnormal bimanual exam, bladder neck and proximal urethral concerning for infiltrative process. Cr back to  baseline with stent placement.  Plan: -intraop findings discussed with patient and husband -pathology pending, will see again Monday if still in house -OK to d/c Foley -discussed with Dr. Tish Men     LOS: 2 days    Hollice Espy 03/16/2017

## 2017-03-16 NOTE — Progress Notes (Signed)
Dr. Dewitt Rota from [facility] has accepted patient.  [facility] also on divert. They will need post transfusion hgb and vitals prior to transfer

## 2017-03-16 NOTE — Discharge Summary (Signed)
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 Arthritis   . Cancer ([place])    lymph nodes, in process of diagnosing  . Cervical disc disease    upper cervical, denied numbness / tingling BUE  . Cervical stenosis of spine   . Fracture 04/2016   left pubis bone-atypical   . Lymphadenopathy 09/05/2016   R clavical region  . Neuromuscular disorder ([place])    compressed disc in neck when 34 or 61 years old  . Post-menopausal 2011  . Primary cancer of unknown site [facility])   . Tubular adenoma     HOSPITAL COURSE:  61 year old female with metastatic cancer of unknown primary who presented with abdominal pain and nausea and found to have small bowel obstruction.  1. Small bowel obstruction: Given findings on yesterday's cystoscopy, etiolgy of SBO is most likley from tumor burden rather then inflammatory/infectious source Continue NG tube to low suction Continue IVF Surgery does not feel comfortable operating on this patient. I have been asked to transfer patient to tertiary center.   2. UTI: Continue Zosyn   3. Progressive right hydronephrosis: S/p cystoscopy and right ureteral stent Appreciate urology consult Right retrograde pyelogram with high-grade distal ureteral obstruction located approximately 3 cm from the UVJ with apple core-like appearance  concerning for extrinsic ureteral mass.   4. Hypokalemia: Resolved with replacement   5. Acute on chronic Anemia of chronic disease with GIB: Hemoglobin low today. Consented for blood transfusion Start PPI IV BID There is no GI coverage and therefore needs transfer   6. 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 Best option is for patient to be transferred to [facility] for surgery. Depending on outcome, Dr Rogue Bussing is willing to try chemo afterwards    DISCHARGE CONDITIONS AND DIET:  Guarded condition NPO  CONSULTS OBTAINED:  Treatment Team:  Olean Ree, MD Hollice Espy, MD Cammie Sickle, MD  DRUG ALLERGIES:   Allergies  Allergen Reactions  . Sulfa Antibiotics Hives    DISCHARGE MEDICATIONS:   Current Discharge Medication List    STOP taking these medications     acetaminophen (TYLENOL) 500 MG tablet      Calcium Carb-Cholecalciferol (CALCIUM 600+D3 PO)      ciprofloxacin (CIPRO) 500 MG tablet      gabapentin (NEURONTIN) 100 MG capsule      lactulose (CEPHULAC) 10 g packet      lidocaine-prilocaine (EMLA) cream      naproxen sodium (ANAPROX) 220 MG tablet      ondansetron (ZOFRAN) 8 MG tablet      phenazopyridine (PYRIDIUM) 100 MG tablet      polyethylene glycol (MIRALAX / GLYCOLAX) packet      potassium chloride SA (K-DUR,KLOR-CON) 20 MEQ tablet      prochlorperazine (COMPAZINE) 10 MG tablet      Sennosides (SENNA LAX PO)      traMADol (ULTRAM) 50 MG tablet  estradiol (ESTRACE) 0.1 MG/GM vaginal cream           Today   CHIEF COMPLAINT:  Patient now with GIB this am through NGT   VITAL SIGNS:  Blood pressure (!) 144/73, pulse (!) 106, temperature 98.2 F (36.8 C), temperature source Oral, resp. rate 18, height 5\' 5"  (1.651 m), weight 53.3 kg (117 lb 9.6 oz), SpO2 99 %.   REVIEW OF SYSTEMS:  Review of Systems  Constitutional: Positive for malaise/fatigue and weight loss. Negative for  chills and fever.  HENT: Negative.  Negative for ear discharge, ear pain, hearing loss, nosebleeds and sore throat.   Eyes: Negative.  Negative for blurred vision and pain.  Respiratory: Negative.  Negative for cough, hemoptysis, shortness of breath and wheezing.   Cardiovascular: Negative.  Negative for chest pain, palpitations and leg swelling.  Gastrointestinal: Positive for abdominal pain. Negative for blood in stool, diarrhea, nausea and vomiting.       Blood through NGT  Genitourinary: Negative.  Negative for dysuria.  Musculoskeletal: Negative.  Negative for back pain.  Skin: Negative.   Neurological: Positive for weakness. Negative for dizziness, tremors, speech change, focal weakness, seizures and headaches.  Endo/Heme/Allergies: Negative.  Does not bruise/bleed easily.  Psychiatric/Behavioral: Negative.  Negative for depression, hallucinations and suicidal ideas.     PHYSICAL EXAMINATION:  GENERAL:  61 y.o.-year-old patient lying in the bed with no acute distress.  NECK:  Supple, no jugular venous distention. No thyroid enlargement, no tenderness.  LUNGS: Normal breath sounds bilaterally, no wheezing, rales,rhonchi  No use of accessory muscles of respiration.  CARDIOVASCULAR: S1, S2 normal. No murmurs, rubs, or gallops.  ABDOMEN: Distended. Bowel sounds present. No organomegaly or mass.  EXTREMITIES: No pedal edema, cyanosis, or clubbing.  PSYCHIATRIC: The patient is alert and oriented x 3.  SKIN: No obvious rash, lesion, or ulcer.   DATA REVIEW:   CBC  Recent Labs Lab 03/16/17 0400  WBC 9.7  HGB 7.8*  HCT 23.1*  PLT 263    Chemistries   Recent Labs Lab 03/15/17 0356 03/16/17 0400  NA 136 140  K 3.5 3.7  CL 100* 106  CO2 29 26  GLUCOSE 93 87  BUN 15 13  CREATININE 0.97 0.42*  CALCIUM 8.0* 7.7*  AST 26  --   ALT 17  --   ALKPHOS 90  --   BILITOT 0.7  --     Cardiac Enzymes  Recent Labs Lab 03/14/17 1030  TROPONINI 0.04*    Microbiology  Results  @[id number] @  RADIOLOGY:  Dg Abd 1 View  Result Date: 03/15/2017 CLINICAL DATA:  Right ureteral stent placement. EXAM: ABDOMEN - 1 VIEW; DG C-ARM 61-120 MIN COMPARISON:  Previous relevant examinations. FINDINGS: Three C-arm views of the right pelvis and abdomen are submitted for interpretation. These demonstrate retrograde opacification of the right ureter. There is mild irregularity and marked narrowing of the distal ureter. Subsequent images demonstrate a the right ureteral stent in satisfactory position with persistent contrast in the right ureter above the level of irregularity and narrowing. IMPRESSION: Irregularity and narrowing of the distal right ureter, suspicious for the possibility of a neoplasm involving the distal ureter. Subsequent placement of a right ureteral stent in satisfactory position. Electronically Signed   By: Claudie Revering M.D.   On: 03/15/2017 15:16   Dg Abd 1 View  Result Date: 03/15/2017 CLINICAL DATA:  61 year old female with small bowel obstruction, abnormal terminal ileum. Carcinoma of unknown primary, lytic bone lesions and metastatic lymphadenopathy  including in the neck. EXAM: ABDOMEN - 1 VIEW COMPARISON:  CT Abdomen and Pelvis and radiographs 03/14/2017 and earlier. FINDINGS: KUB view of the abdomen at 0800 hours. Enteric tube side hole remains at the level of the proximal gastric body. There is some residual excreted IV contrast suspected in the right renal collecting system, as well as the right ureter and bladder. The left renal collecting system and ureter are no longer visible. Bowel gas pattern has not significantly changed since 03/14/2017 although the oral contrast administered at that time is now throughout the colon. Stable visualized osseous structures. IMPRESSION: 1. No significant change in the bowel gas pattern since the CT on 03/14/2017, but the oral contrast administered at that time is now throughout the colon indicating a partial small bowel  obstruction. 2. Abnormal persistence of IV contrast in the right renal collecting system, right ureter and bladder. Suspect distal right ureter obstruction as on the recent CT. Consider cystoscopy for evaluation of the right UVJ. 3. Stable enteric tube. Electronically Signed   By: Genevie Ann M.D.   On: 03/15/2017 08:37   Dg C-arm 1-60 Min  Result Date: 03/15/2017 CLINICAL DATA:  Right ureteral stent placement. EXAM: ABDOMEN - 1 VIEW; DG C-ARM 61-120 MIN COMPARISON:  Previous relevant examinations. FINDINGS: Three C-arm views of the right pelvis and abdomen are submitted for interpretation. These demonstrate retrograde opacification of the right ureter. There is mild irregularity and marked narrowing of the distal ureter. Subsequent images demonstrate a the right ureteral stent in satisfactory position with persistent contrast in the right ureter above the level of irregularity and narrowing. IMPRESSION: Irregularity and narrowing of the distal right ureter, suspicious for the possibility of a neoplasm involving the distal ureter. Subsequent placement of a right ureteral stent in satisfactory position. Electronically Signed   By: Claudie Revering M.D.   On: 03/15/2017 15:16      Current Discharge Medication List    STOP taking these medications     acetaminophen (TYLENOL) 500 MG tablet      Calcium Carb-Cholecalciferol (CALCIUM 600+D3 PO)      ciprofloxacin (CIPRO) 500 MG tablet      gabapentin (NEURONTIN) 100 MG capsule      lactulose (CEPHULAC) 10 g packet      lidocaine-prilocaine (EMLA) cream      naproxen sodium (ANAPROX) 220 MG tablet      ondansetron (ZOFRAN) 8 MG tablet      phenazopyridine (PYRIDIUM) 100 MG tablet      polyethylene glycol (MIRALAX / GLYCOLAX) packet      potassium chloride SA (K-DUR,KLOR-CON) 20 MEQ tablet      prochlorperazine (COMPAZINE) 10 MG tablet      Sennosides (SENNA LAX PO)      traMADol (ULTRAM) 50 MG tablet      estradiol (ESTRACE) 0.1 MG/GM  vaginal cream            Management plans discussed with the patient and she is in agreement. Stable for discharge   Patient should follow up with surgical and GI services  CODE STATUS:     Code Status Orders        Start     Ordered   03/14/17 1554  Full code  Continuous     03/14/17 1555    Code Status History    Date Active Date Inactive Code Status Order ID Comments User Context   This patient has a current code status but no historical code status.  TOTAL TIME TAKING CARE OF THIS PATIENT: 37 minutes.    Note: This dictation was prepared with Dragon dictation along with smaller phrase technology. Any transcriptional errors that result from this process are unintentional.  Mykelti Goldenstein M.D on 03/16/2017 at 5:49 PM  Between 7am to 6pm - Pager - [phone] After 6pm go to [email or web address] - password [email or web address] [facility]  Office  [phone]  CC: Primary care physician; Rusty Aus, MD

## 2017-03-16 NOTE — Progress Notes (Signed)
Notified [facility] of patient accepting bed at [facility]

## 2017-03-16 NOTE — Progress Notes (Signed)
I have called [facility] and [facility] to transfer patient. I am still waiting to hear back from these 2 hospitals. I spoke with SURGICAL oncologist at [facility] who is wiling to consult on patient if patient accepted. There are no beds at [facility] at this time, not sure about [facility] yet.

## 2017-03-16 NOTE — Progress Notes (Signed)
03/16/2017  Subjective: Patient had cystoscopy with biopsy and stent placement yesterday with Dr. Erlene Quan.  No acute pain overnight.  Patient feels better with NG tube in place.  Reports no flatus or BM yet.    Vital signs: Temp:  [97.2 F (36.2 C)-98.9 F (37.2 C)] 98.3 F (36.8 C) (04/20 0749) Pulse Rate:  [81-94] 90 (04/20 0749) Resp:  [11-22] 18 (04/20 0351) BP: (112-144)/(63-126) 112/63 (04/20 0749) SpO2:  [96 %-100 %] 96 % (04/20 0749)   Intake/Output: 04/19 0701 - 04/20 0700 In: 3231.3 [P.O.:1850; I.V.:1281.3; IV Piggyback:100] Out: 2650 [Urine:2100; Emesis/NG output:550] Last BM Date: 03/14/17  Physical Exam: Constitutional:  No acute distress Abdomen:  Soft, mildly distended, though improved, with mild tenderness to palpation   Labs:   Recent Labs  03/15/17 0356 03/16/17 0400  WBC 7.3 9.7  HGB 8.4* 7.8*  HCT 24.4* 23.1*  PLT 264 263    Recent Labs  03/15/17 0356 03/16/17 0400  NA 136 140  K 3.5 3.7  CL 100* 106  CO2 29 26  GLUCOSE 93 87  BUN 15 13  CREATININE 0.97 0.42*  CALCIUM 8.0* 7.7*   No results for input(s): LABPROT, INR in the last 72 hours.  Imaging: Dg Abd 1 View  Result Date: 03/15/2017 CLINICAL DATA:  Right ureteral stent placement. EXAM: ABDOMEN - 1 VIEW; DG C-ARM 61-120 MIN COMPARISON:  Previous relevant examinations. FINDINGS: Three C-arm views of the right pelvis and abdomen are submitted for interpretation. These demonstrate retrograde opacification of the right ureter. There is mild irregularity and marked narrowing of the distal ureter. Subsequent images demonstrate a the right ureteral stent in satisfactory position with persistent contrast in the right ureter above the level of irregularity and narrowing. IMPRESSION: Irregularity and narrowing of the distal right ureter, suspicious for the possibility of a neoplasm involving the distal ureter. Subsequent placement of a right ureteral stent in satisfactory position. Electronically  Signed   By: Claudie Revering M.D.   On: 03/15/2017 15:16   Dg C-arm 1-60 Min  Result Date: 03/15/2017 CLINICAL DATA:  Right ureteral stent placement. EXAM: ABDOMEN - 1 VIEW; DG C-ARM 61-120 MIN COMPARISON:  Previous relevant examinations. FINDINGS: Three C-arm views of the right pelvis and abdomen are submitted for interpretation. These demonstrate retrograde opacification of the right ureter. There is mild irregularity and marked narrowing of the distal ureter. Subsequent images demonstrate a the right ureteral stent in satisfactory position with persistent contrast in the right ureter above the level of irregularity and narrowing. IMPRESSION: Irregularity and narrowing of the distal right ureter, suspicious for the possibility of a neoplasm involving the distal ureter. Subsequent placement of a right ureteral stent in satisfactory position. Electronically Signed   By: Claudie Revering M.D.   On: 03/15/2017 15:16    Assessment/Plan: 61 yo female with metastatic cancer of unknown primary, with SBO and UTI  --Given findings in OR by Dr. Erlene Quan of possible extrinsic compression of the right ureter, there is also concern for possible tumor burden causing her SBO rather than inflammation from infectious source. Discussed with Dr. Rogue Bussing yesterday about a GI consult for the possibility of a stent and there's currently an order for this.  Await GI consult and recommendations. --Otherwise continue with NG tube to suction and NPO and IV fluid hydration.  Melvyn Neth, [place]

## 2017-03-16 NOTE — Telephone Encounter (Signed)
Spoke to husband about the biopsy- positive for cancer.

## 2017-03-16 NOTE — Progress Notes (Signed)
Patient has H&H due to be drawn however patient is receiving blood.  H&H will be drawn 1 hour post transfusion.  Dr. Benjie Karvonen is aware.  Clarise Cruz, RN

## 2017-03-16 NOTE — Progress Notes (Signed)
Report called to [facility]-given to [place] [facility][id number]-will transport with IVF and NGT in place-Copies of chart made and placed in discharge envelope.

## 2017-03-16 NOTE — Progress Notes (Signed)
Accepting MD at [facility] Dr. Rockne Coons  Needs repeat labs and will ask nurse to call transfer line once repeat hgb resulted,  TIME total calling [facility] transfer line  Speaking with MDs and d/c summary.  40 minutes

## 2017-03-16 NOTE — Progress Notes (Signed)
[facility] [place] NAME: Sylvia Jones    MR#:  [id number]  DATE OF BIRTH:  1955-12-03  SUBJECTIVE:   Patient has dark blood from NGT Still distended No nausea Had uretral stent placed yesterday with possible tumor outside bladder Has a mouth sore she uses baking soda for this at home Dysuria better   REVIEW OF SYSTEMS:    Review of Systems  Constitutional: Negative for fever, chills weight loss HENT: Negative for ear pain, nosebleeds, congestion, facial swelling, rhinorrhea, neck pain, neck stiffness and ear discharge.   Respiratory: Negative for cough, shortness of breath, wheezing  Cardiovascular: Negative for chest pain, palpitations and leg swelling.  Gastrointestinal: Negative for heartburn, ++ abdominal pain with blood from NGT, no vomiting, diarrhea or consitpation Genitourinary: NO dysuria, no urgency, frequency, hematuria Musculoskeletal: Negative for back pain or joint pain Neurological: Negative for dizziness, seizures, syncope, focal weakness,  numbness and headaches.  Hematological: Does not bruise/bleed easily.  Psychiatric/Behavioral: Negative for hallucinations, confusion, dysphoric mood    Tolerating Diet: npo      DRUG ALLERGIES:   Allergies  Allergen Reactions  . Sulfa Antibiotics Hives    VITALS:  Blood pressure 112/63, pulse 90, temperature 98.3 F (36.8 C), temperature source Oral, resp. rate 18, height 5\' 5"  (1.651 m), weight 53.3 kg (117 lb 9.6 oz), SpO2 96 %.  PHYSICAL EXAMINATION:  Constitutional: Appears well-developed and well-nourished. No distress.Has NG tube placed HENT: Normocephalic. Marland Kitchen Oropharynx is clear and moist.  Eyes: Conjunctivae and EOM are normal. PERRLA, no scleral icterus.  Neck: Normal ROM. Neck supple. No JVD. No tracheal deviation. CVS: RRR, S1/S2 +, no murmurs, no gallops, no carotid bruit.  Pulmonary: Effort and breath sounds normal, no stridor, rhonchi, wheezes,  rales.  Abdominal: Soft. BS +,  positive distension, tenderness, no rebound or guarding.  Musculoskeletal: Normal range of motion. No edema and no tenderness.  Neuro: Alert. CN 2-12 grossly intact. No focal deficits. Skin: Skin is warm and dry. No rash noted. Psychiatric: Normal mood and affect.      LABORATORY PANEL:   CBC  Recent Labs Lab 03/16/17 0400  WBC 9.7  HGB 7.8*  HCT 23.1*  PLT 263   ------------------------------------------------------------------------------------------------------------------  Chemistries   Recent Labs Lab 03/15/17 0356 03/16/17 0400  NA 136 140  K 3.5 3.7  CL 100* 106  CO2 29 26  GLUCOSE 93 87  BUN 15 13  CREATININE 0.97 0.42*  CALCIUM 8.0* 7.7*  AST 26  --   ALT 17  --   ALKPHOS 90  --   BILITOT 0.7  --    ------------------------------------------------------------------------------------------------------------------  Cardiac Enzymes  Recent Labs Lab 03/14/17 1030  TROPONINI 0.04*   ------------------------------------------------------------------------------------------------------------------  RADIOLOGY:  Dg Abd 1 View  Result Date: 03/15/2017 CLINICAL DATA:  Right ureteral stent placement. EXAM: ABDOMEN - 1 VIEW; DG C-ARM 61-120 MIN COMPARISON:  Previous relevant examinations. FINDINGS: Three C-arm views of the right pelvis and abdomen are submitted for interpretation. These demonstrate retrograde opacification of the right ureter. There is mild irregularity and marked narrowing of the distal ureter. Subsequent images demonstrate a the right ureteral stent in satisfactory position with persistent contrast in the right ureter above the level of irregularity and narrowing. IMPRESSION: Irregularity and narrowing of the distal right ureter, suspicious for the possibility of a neoplasm involving the distal ureter. Subsequent placement of a right ureteral stent in satisfactory position. Electronically Signed   By: Percell Locus.D.  On: 03/15/2017 15:16   Dg Abd 1 View  Result Date: 03/15/2017 CLINICAL DATA:  61 year old female with small bowel obstruction, abnormal terminal ileum. Carcinoma of unknown primary, lytic bone lesions and metastatic lymphadenopathy including in the neck. EXAM: ABDOMEN - 1 VIEW COMPARISON:  CT Abdomen and Pelvis and radiographs 03/14/2017 and earlier. FINDINGS: KUB view of the abdomen at 0800 hours. Enteric tube side hole remains at the level of the proximal gastric body. There is some residual excreted IV contrast suspected in the right renal collecting system, as well as the right ureter and bladder. The left renal collecting system and ureter are no longer visible. Bowel gas pattern has not significantly changed since 03/14/2017 although the oral contrast administered at that time is now throughout the colon. Stable visualized osseous structures. IMPRESSION: 1. No significant change in the bowel gas pattern since the CT on 03/14/2017, but the oral contrast administered at that time is now throughout the colon indicating a partial small bowel obstruction. 2. Abnormal persistence of IV contrast in the right renal collecting system, right ureter and bladder. Suspect distal right ureter obstruction as on the recent CT. Consider cystoscopy for evaluation of the right UVJ. 3. Stable enteric tube. Electronically Signed   By: Genevie Ann M.D.   On: 03/15/2017 08:37   Dg Abdomen 1 View  Result Date: 03/14/2017 CLINICAL DATA:  Acute abdominal pain, concern for obstruction or free abdominal air EXAM: ABDOMEN - 1 VIEW COMPARISON:  None. FINDINGS: No definite evidence of free abdominal air. Gaseous distended bowel loops are noted mid and left lower abdomen with some air-fluid levels. Ileus or early bowel obstruction cannot be excluded. IMPRESSION: Gaseous distended bowel loops are noted mid and left lower abdomen with some air-fluid levels. Ileus or early bowel obstruction cannot be excluded. Electronically Signed    By: Lahoma Crocker M.D.   On: 03/14/2017 12:28   Ct Abdomen Pelvis W Contrast  Result Date: 03/14/2017 CLINICAL DATA:  Abdominal bloating, right upper quadrant pain and elevated white blood cell count. History of metastatic carcinoma of unknown primary. EXAM: CT ABDOMEN AND PELVIS WITH CONTRAST TECHNIQUE: Multidetector CT imaging of the abdomen and pelvis was performed using the standard protocol following bolus administration of intravenous contrast. CONTRAST:  75 ml ISOVUE-300 IOPAMIDOL (ISOVUE-300) INJECTION 61% COMPARISON:  CT chest, abdomen and pelvis 03/09/2017 and 01/29/2017. FINDINGS: Lower chest: No pleural or pericardial effusion. Small pleural base calcification right lower lobe noted. Mild dependent atelectasis. Fluid filled and dilated esophagus is noted. Hepatobiliary: Several small stones are seen in the neck of the gallbladder. No focal liver lesion. The biliary tree is unremarkable. Pancreas: Unremarkable. No pancreatic ductal dilatation or surrounding inflammatory changes. Spleen: Normal in size without focal abnormality. Adrenals/Urinary Tract: There is severe right hydronephrosis which is worse than on the most recent comparison with an associated delayed nephrogram. The urinary bladder is incompletely distended with irregular thickening of its walls. The right ureter may be obstruction at the UVJ of the urinary bladder wall. No obstructing stone is seen. The left kidney appears normal. Adrenal glands are normal. Stomach/Bowel: The patient has a high grade small bowel obstruction. Transition point is at the terminal ileum. Walls of the terminal ileum are markedly an irregularly thickened. There is little to no surrounding inflammatory change. No pneumatosis or portal venous gas. No fatty proliferation of the right lower quadrant. The colon is nearly completely decompressed. The stomach is markedly distended with fluid. Vascular/Lymphatic: No lymphadenopathy. Reproductive: Uterus and bilateral  adnexa are  unremarkable. Other: Small volume of free pelvic fluid is noted. Musculoskeletal: No acute bony abnormality. No focal lesion is identified. IMPRESSION: High-grade small bowel obstruction with a transition point at the terminal ileum. Markedly thickened and irregular walls of the terminal ileum are identified and could be due to inflammatory change such as Crohn's disease but the appearance is also worrisome for mass lesion/small bowel neoplasm. Severe right hydronephrosis is worsened since the prior examination. No obstructing stone is identified. Obstruction may be at the right UVJ as the urinary bladder walls are markedly thickened and irregular. Bladder neoplasm or cystitis are possible. Gallstones without evidence cholecystitis. Electronically Signed   By: Inge Rise M.D.   On: 03/14/2017 13:59   Dg Abd Portable 1 View  Result Date: 03/14/2017 CLINICAL DATA:  NG tube placement. EXAM: PORTABLE ABDOMEN - 1 VIEW COMPARISON:  CT scan 03/14/2017 FINDINGS: The NG tube tip is in the body region of the stomach. Persistent contrast noted in both collecting systems with right-sided hydroureteronephrosis. The lung bases are grossly clear. IMPRESSION: The NG tube tip is in the body region of the stomach. Electronically Signed   By: Marijo Sanes M.D.   On: 03/14/2017 16:51   Dg C-arm 1-60 Min  Result Date: 03/15/2017 CLINICAL DATA:  Right ureteral stent placement. EXAM: ABDOMEN - 1 VIEW; DG C-ARM 61-120 MIN COMPARISON:  Previous relevant examinations. FINDINGS: Three C-arm views of the right pelvis and abdomen are submitted for interpretation. These demonstrate retrograde opacification of the right ureter. There is mild irregularity and marked narrowing of the distal ureter. Subsequent images demonstrate a the right ureteral stent in satisfactory position with persistent contrast in the right ureter above the level of irregularity and narrowing. IMPRESSION: Irregularity and narrowing of the distal  right ureter, suspicious for the possibility of a neoplasm involving the distal ureter. Subsequent placement of a right ureteral stent in satisfactory position. Electronically Signed   By: Claudie Revering M.D.   On: 03/15/2017 15:16     ASSESSMENT AND PLAN:   61 year old female with metastatic cancer of unknown primary who presented with abdominal pain and nausea and found to have small bowel obstruction.  1. Small bowel obstruction: Given findings on yesterday;s cystoscopy, etiolgy of SBO is most likley from tumor burden rather then inflammatory/infectious source Discussed today with Dr Starleen Arms Will await GI consult (tonight no coverage during the day)  Appreciate surgical evaluation Continue NG tube to low suction Continue IVF Continue Zosyn for now, but likely can d/c tomorrow   2. UTI: Continue Zosyn   3. Progressive right hydronephrosis: S/p cystoscopy and right ureteral stent Appreciate urology consult Right retrograde pyelogram with high-grade distal ureteral obstruction located approximately 3 cm from the UVJ with apple core-like appearance concerning for extrinsic ureteral mass.    4. Hypokalemia: Resolved with replacement   5. Anemia of chronic disease: Hemoglobin low today from possibly GIB Consented for blood transfusion Start PPI IV BID GI consult tonight   6. 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  CODE STATUS: full  TOTAL TIME TAKING CARE OF THIS PATIENT: 27 minutes.     POSSIBLE D/C 2-4 days, DEPENDING ON CLINICAL CONDITION.   Henlee Donovan M.D on 03/16/2017 at 11:18 AM  Between 7am to 6pm - Pager - [phone] After 6pm go  to [email or web address] - password [email or web address] [facility]  Office  [phone]  CC: Primary care  physician; Rusty Aus, MD  Note: This dictation was prepared with Dragon dictation along with smaller phrase technology. Any transcriptional errors that result from this process are unintentional.

## 2017-03-16 NOTE — Progress Notes (Signed)
[facility] life flight called-transport in place.

## 2017-03-16 NOTE — Progress Notes (Signed)
Sylvia Jones   DOB:1956-03-19   OQ#:[id number]    Subjective: pt had cystoscopy yesterday. And had foley catheter placed. Noted to have blood in NGT this AM. Abdominal pain improved. No fevers or chills.   Objective:  Vitals:   03/16/17 1555 03/16/17 1621  BP: (!) 147/75 (!) 144/73  Pulse: (!) 102 (!) 106  Resp: 18 18  Temp: 98 F (36.7 C) 98.2 F (36.8 C)     Intake/Output Summary (Last 24 hours) at 03/16/17 1749 Last data filed at 03/16/17 1655  Gross per 24 hour  Intake          5014.58 ml  Output             2850 ml  Net          2164.58 ml    GENERAL: Moderately nourished; thin built Alert, no distress and comfortable.   Accompanied by her son and husband. EYES: no pallor or icterus OROPHARYNX: no thrush or ulceration. NG tube in place NECK: supple, approximately 2 cm lymph node felt in the left neck. LYMPH:  no palpable lymphadenopathy in the  axillary or inguinal regions LUNGS: decreased breath sounds to auscultation at bases and  No wheeze or crackles HEART/CVS: regular rate & rhythm and no murmurs; No lower extremity edema ABDOMEN: abdomen soft, non-tender and decreased bowel sounds Musculoskeletal:no cyanosis of digits and no clubbing  PSYCH: alert & oriented x 3 with fluent speech NEURO: no focal motor/sensory deficits SKIN:  no rashes or significant lesions   Labs:  Lab Results  Component Value Date   WBC 9.7 03/16/2017   HGB 7.8 (L) 03/16/2017   HCT 23.1 (L) 03/16/2017   MCV 98.1 03/16/2017   PLT 263 03/16/2017   NEUTROABS 10.0 (H) 03/14/2017    Lab Results  Component Value Date   NA 140 03/16/2017   K 3.7 03/16/2017   CL 106 03/16/2017   CO2 26 03/16/2017    Studies:  Dg Abd 1 View  Result Date: 03/15/2017 CLINICAL DATA:  Right ureteral stent placement. EXAM: ABDOMEN - 1 VIEW; DG C-ARM 61-120 MIN COMPARISON:  Previous relevant examinations. FINDINGS: Three C-arm views of the right pelvis and abdomen are submitted for interpretation. These  demonstrate retrograde opacification of the right ureter. There is mild irregularity and marked narrowing of the distal ureter. Subsequent images demonstrate a the right ureteral stent in satisfactory position with persistent contrast in the right ureter above the level of irregularity and narrowing. IMPRESSION: Irregularity and narrowing of the distal right ureter, suspicious for the possibility of a neoplasm involving the distal ureter. Subsequent placement of a right ureteral stent in satisfactory position. Electronically Signed   By: Claudie Revering M.D.   On: 03/15/2017 15:16   Dg Abd 1 View  Result Date: 03/15/2017 CLINICAL DATA:  61 year old female with small bowel obstruction, abnormal terminal ileum. Carcinoma of unknown primary, lytic bone lesions and metastatic lymphadenopathy including in the neck. EXAM: ABDOMEN - 1 VIEW COMPARISON:  CT Abdomen and Pelvis and radiographs 03/14/2017 and earlier. FINDINGS: KUB view of the abdomen at 0800 hours. Enteric tube side hole remains at the level of the proximal gastric body. There is some residual excreted IV contrast suspected in the right renal collecting system, as well as the right ureter and bladder. The left renal collecting system and ureter are no longer visible. Bowel gas pattern has not significantly changed since 03/14/2017 although the oral contrast administered at that time is now throughout the colon. Stable  visualized osseous structures. IMPRESSION: 1. No significant change in the bowel gas pattern since the CT on 03/14/2017, but the oral contrast administered at that time is now throughout the colon indicating a partial small bowel obstruction. 2. Abnormal persistence of IV contrast in the right renal collecting system, right ureter and bladder. Suspect distal right ureter obstruction as on the recent CT. Consider cystoscopy for evaluation of the right UVJ. 3. Stable enteric tube. Electronically Signed   By: Genevie Ann M.D.   On: 03/15/2017 08:37    Dg C-arm 1-60 Min  Result Date: 03/15/2017 CLINICAL DATA:  Right ureteral stent placement. EXAM: ABDOMEN - 1 VIEW; DG C-ARM 61-120 MIN COMPARISON:  Previous relevant examinations. FINDINGS: Three C-arm views of the right pelvis and abdomen are submitted for interpretation. These demonstrate retrograde opacification of the right ureter. There is mild irregularity and marked narrowing of the distal ureter. Subsequent images demonstrate a the right ureteral stent in satisfactory position with persistent contrast in the right ureter above the level of irregularity and narrowing. IMPRESSION: Irregularity and narrowing of the distal right ureter, suspicious for the possibility of a neoplasm involving the distal ureter. Subsequent placement of a right ureteral stent in satisfactory position. Electronically Signed   By: Claudie Revering M.D.   On: 03/15/2017 15:16    Assessment & Plan:   # 61 year old female patient with a history of carcinoma of unknown primary/likely breast- on chemotherapy admitted to the hospital  # High grade small bowel obstruction- clinically suspicious of malignant obstruction; less likely infectious/inflammatory. Will need colonoscopy/ GI evaluation. Discussed with Dr.Pisocya.   # Upper GI bleed- thru NGT; Hb today; 7.8; transfuse 1 unit PRBC. GI services unavailable. Discussed with Dr.Eliott.   # Right hydronephrosis progressive s/p cystoscopy [Dr.Brandon];findings suggestive for infiltrative malignancy. Spoke to Dr.Rubinas- bx positive for malignancy [similar to previous Left neck Bx].   # Carcinoma of unknown primary- likely breast- most recently on Adriamycin Cytoxan cycle #4- CT scan shows overall improvement response except for slight increase in the left neck lymph node.     # long discussion with pt/husband/son- re: our concerns of malignant obstruction/ upper GI bleed/need for colonoscopy. Family/pt interested in transfer to tertiary center. Spoke to Dr.Modi. Dr.Finnegan  is available/on call over week end.   # 45 minutes face-to-face with the patient discussing the above plan of care; more than 50% of time spent on prognosis/ natural history; counseling and coordination.    Cammie Sickle, MD 03/16/2017  5:49 PM

## 2017-03-17 DIAGNOSIS — R935 Abnormal findings on diagnostic imaging of other abdominal regions, including retroperitoneum: Secondary | ICD-10-CM | POA: Diagnosis not present

## 2017-03-17 DIAGNOSIS — C7951 Secondary malignant neoplasm of bone: Secondary | ICD-10-CM | POA: Diagnosis not present

## 2017-03-17 DIAGNOSIS — R1031 Right lower quadrant pain: Secondary | ICD-10-CM | POA: Diagnosis not present

## 2017-03-17 DIAGNOSIS — E43 Unspecified severe protein-calorie malnutrition: Secondary | ICD-10-CM | POA: Diagnosis not present

## 2017-03-17 DIAGNOSIS — N135 Crossing vessel and stricture of ureter without hydronephrosis: Secondary | ICD-10-CM | POA: Diagnosis not present

## 2017-03-17 DIAGNOSIS — J90 Pleural effusion, not elsewhere classified: Secondary | ICD-10-CM | POA: Diagnosis not present

## 2017-03-17 DIAGNOSIS — C784 Secondary malignant neoplasm of small intestine: Secondary | ICD-10-CM | POA: Diagnosis not present

## 2017-03-17 DIAGNOSIS — N1330 Unspecified hydronephrosis: Secondary | ICD-10-CM | POA: Diagnosis not present

## 2017-03-17 DIAGNOSIS — C50919 Malignant neoplasm of unspecified site of unspecified female breast: Secondary | ICD-10-CM | POA: Diagnosis not present

## 2017-03-17 DIAGNOSIS — R339 Retention of urine, unspecified: Secondary | ICD-10-CM | POA: Diagnosis not present

## 2017-03-17 DIAGNOSIS — K56609 Unspecified intestinal obstruction, unspecified as to partial versus complete obstruction: Secondary | ICD-10-CM | POA: Diagnosis not present

## 2017-03-17 DIAGNOSIS — N3289 Other specified disorders of bladder: Secondary | ICD-10-CM | POA: Diagnosis not present

## 2017-03-17 DIAGNOSIS — N132 Hydronephrosis with renal and ureteral calculous obstruction: Secondary | ICD-10-CM | POA: Diagnosis not present

## 2017-03-17 DIAGNOSIS — K594 Anal spasm: Secondary | ICD-10-CM | POA: Diagnosis not present

## 2017-03-17 DIAGNOSIS — E440 Moderate protein-calorie malnutrition: Secondary | ICD-10-CM | POA: Diagnosis not present

## 2017-03-17 DIAGNOSIS — R54 Age-related physical debility: Secondary | ICD-10-CM | POA: Diagnosis not present

## 2017-03-17 DIAGNOSIS — R5381 Other malaise: Secondary | ICD-10-CM | POA: Diagnosis not present

## 2017-03-17 DIAGNOSIS — C7911 Secondary malignant neoplasm of bladder: Secondary | ICD-10-CM | POA: Diagnosis not present

## 2017-03-17 DIAGNOSIS — C778 Secondary and unspecified malignant neoplasm of lymph nodes of multiple regions: Secondary | ICD-10-CM | POA: Diagnosis not present

## 2017-03-17 DIAGNOSIS — R102 Pelvic and perineal pain: Secondary | ICD-10-CM | POA: Diagnosis not present

## 2017-03-17 DIAGNOSIS — R9431 Abnormal electrocardiogram [ECG] [EKG]: Secondary | ICD-10-CM | POA: Diagnosis not present

## 2017-03-17 DIAGNOSIS — Z681 Body mass index (BMI) 19 or less, adult: Secondary | ICD-10-CM | POA: Diagnosis not present

## 2017-03-17 DIAGNOSIS — D62 Acute posthemorrhagic anemia: Secondary | ICD-10-CM | POA: Diagnosis not present

## 2017-03-17 DIAGNOSIS — K56699 Other intestinal obstruction unspecified as to partial versus complete obstruction: Secondary | ICD-10-CM | POA: Diagnosis not present

## 2017-03-17 DIAGNOSIS — N134 Hydroureter: Secondary | ICD-10-CM | POA: Diagnosis not present

## 2017-03-17 DIAGNOSIS — Z960 Presence of urogenital implants: Secondary | ICD-10-CM | POA: Diagnosis not present

## 2017-03-17 DIAGNOSIS — R109 Unspecified abdominal pain: Secondary | ICD-10-CM | POA: Diagnosis not present

## 2017-03-17 DIAGNOSIS — N368 Other specified disorders of urethra: Secondary | ICD-10-CM | POA: Diagnosis not present

## 2017-03-17 DIAGNOSIS — C801 Malignant (primary) neoplasm, unspecified: Secondary | ICD-10-CM | POA: Diagnosis not present

## 2017-03-17 DIAGNOSIS — K5900 Constipation, unspecified: Secondary | ICD-10-CM | POA: Diagnosis not present

## 2017-03-17 DIAGNOSIS — N179 Acute kidney failure, unspecified: Secondary | ICD-10-CM | POA: Diagnosis not present

## 2017-03-17 DIAGNOSIS — N131 Hydronephrosis with ureteral stricture, not elsewhere classified: Secondary | ICD-10-CM | POA: Diagnosis not present

## 2017-03-17 DIAGNOSIS — G893 Neoplasm related pain (acute) (chronic): Secondary | ICD-10-CM | POA: Diagnosis not present

## 2017-03-17 DIAGNOSIS — D649 Anemia, unspecified: Secondary | ICD-10-CM | POA: Diagnosis not present

## 2017-03-17 DIAGNOSIS — C7989 Secondary malignant neoplasm of other specified sites: Secondary | ICD-10-CM | POA: Diagnosis not present

## 2017-03-17 DIAGNOSIS — R310 Gross hematuria: Secondary | ICD-10-CM | POA: Diagnosis not present

## 2017-03-17 DIAGNOSIS — R1032 Left lower quadrant pain: Secondary | ICD-10-CM | POA: Diagnosis not present

## 2017-03-17 DIAGNOSIS — Z515 Encounter for palliative care: Secondary | ICD-10-CM | POA: Diagnosis not present

## 2017-03-17 DIAGNOSIS — E875 Hyperkalemia: Secondary | ICD-10-CM | POA: Diagnosis not present

## 2017-03-17 DIAGNOSIS — G9520 Unspecified cord compression: Secondary | ICD-10-CM | POA: Diagnosis not present

## 2017-03-17 DIAGNOSIS — K6389 Other specified diseases of intestine: Secondary | ICD-10-CM | POA: Diagnosis not present

## 2017-03-17 DIAGNOSIS — G8918 Other acute postprocedural pain: Secondary | ICD-10-CM | POA: Diagnosis not present

## 2017-03-17 LAB — TYPE AND SCREEN
ABO/RH(D): O POS
Antibody Screen: NEGATIVE
UNIT DIVISION: 0

## 2017-03-17 LAB — BPAM RBC
Blood Product Expiration Date: 201805162359
ISSUE DATE / TIME: 201804201556
Unit Type and Rh: 5100

## 2017-03-17 NOTE — Progress Notes (Signed)
[facility] transport here to transfer patient-no distress noted.

## 2017-03-19 ENCOUNTER — Ambulatory Visit: Payer: 59

## 2017-03-20 ENCOUNTER — Other Ambulatory Visit: Payer: Self-pay | Admitting: *Deleted

## 2017-03-20 NOTE — Patient Outreach (Addendum)
[facility]) Care Management  03/20/2017  Sylvia Jones [place] [birth year] [id number]   Subjective:Received voicemail message from patient, states she is returning call, gave verbal consent for this RNCM to speak with her husband Aizley Stenseth, [phone]) regarding her healthcare needs as needed. Patient also requested call back ([phone]) to verify receipt of message.   Telephone call to patient's home number ([phone]), no answer, left HIPAA compliant voicemail message, and requested call back.   Objective: Per chart review, patient hospitalized 03/14/17 - 03/16/17 at [facility], transferred to [facility] on 03/16/17 for small bowel obstruction.  Patient has a history of breast cancer, malignant bowel & ureteral obstruction, and metastic carcinoma of unknown origin.    Assessment:  Received [facility] Transition of care referral via [facility] report on 03/16/17.  Transition of care follow up pending patient contact.   Plan: RNCM will call patient for 2nd telephone outreach attempt, transition of care follow up, within 10 business days if no return call, or  within 3 business days of hospital discharge notification.   Diesha Rostad H. Annia Friendly, BSN, [place] [facility] Telephonic CM Phone: [phone] Fax: [phone]

## 2017-03-20 NOTE — Patient Outreach (Addendum)
[facility]) Care Management  03/20/2017  Alazae Crymes [place] 1956/10/16 [id number]   Subjective: Telephone call to patient's home number, spoke with patient's husband, states patient is currently not available, was transferred from [facility] a few days ago to [facility], left HIPAA compliant message for patient with husband, and requested call back.   Husband was advised RNCM could not discuss nature of call without verbal consent from Mrs.Nodine, husband voiced understanding, states he will give Mrs. Bilbo the message, and ask her to call RNCM.    Objective: Per chart review, patient hospitalized 03/14/17 - 03/16/17 at [facility], transferred to [facility] on 03/16/17 for small bowel obstruction.  Patient has a history of breast cancer, malignant bowel & ureteral obstruction, and metastic carcinoma of unknown origin.    Assessment:  Received UMR Transition of care referral via [place] report on 03/16/17.  Transition of care follow up pending patient contact.   Plan: RNCM will call patient for 2nd telephone outreach attempt, transition of care follow up, within 10 business days if no return call, or  within 3 business days of hospital discharge notification.   Dhriti Fales H. Annia Friendly, BSN, [place] Management [facility] Telephonic CM Phone: [phone] Fax: [phone]

## 2017-03-20 NOTE — Patient Outreach (Addendum)
[facility]) Care Management  03/20/2017  Sylvia Jones [place] [birth year] [id number]  Subjective:Received voicemail message from patient's husband, states he is returning call for his wife who was unable at the time [facility] called, and requested call back ([phone]).   Telephone call to patient's home number ([phone]), spoke with patient and husband. Patient stated her name, date of birth, and address.  Discussed [facility] Care Management UMR Transition of care follow up, patient voiced understanding, patient's husband voiced understanding, and is in agreement to complete follow up once patient discharged from [facility] [place].   Patient states she is doing okay and her husband is Furniture conservator/restorer.  Spoke with husband, states he used to be the [employer] Naval architect) at [employer], and did not purchase the [employer].   Husband states he will call this RNCM when patient is discharged to complete transition of care follow up, since discharge date is unknown at this point in time.    Patient states she is very appreciative of the follow up.     Objective: Per chart review, patient hospitalized 03/14/17 - 03/16/17 at [facility], transferred to [facility] on 03/16/17 for small bowel obstruction. Patient has a history of breast cancer, malignant bowel &ureteral obstruction, and metastic carcinoma of unknown origin.    Assessment: Received UMR Transition of care referral via [place] report on 03/16/17. Transition of care follow up pending patient's discharge from hospital.    Plan: RNCM will send case status update to [place] [facility] [place] Management.  RNCM will call patient for 2nd telephone outreach attempt, transition of care follow up,and within 3 business days of hospital discharge notification.   Sylvia Hoerner H. Annia Friendly, BSN, [place] Management [facility] Telephonic  CM Phone: [phone] Fax: [phone]

## 2017-03-21 ENCOUNTER — Inpatient Hospital Stay: Payer: 59 | Admitting: Internal Medicine

## 2017-03-21 ENCOUNTER — Inpatient Hospital Stay: Payer: 59

## 2017-03-22 ENCOUNTER — Ambulatory Visit: Payer: 59 | Admitting: *Deleted

## 2017-03-22 NOTE — Telephone Encounter (Signed)
x

## 2017-04-06 ENCOUNTER — Telehealth: Payer: Self-pay | Admitting: Internal Medicine

## 2017-04-06 NOTE — Telephone Encounter (Signed)
Left message for pt's husband to discuss the clinical status.

## 2017-04-07 ENCOUNTER — Emergency Department: Payer: 59

## 2017-04-07 ENCOUNTER — Emergency Department
Admission: EM | Admit: 2017-04-07 | Discharge: 2017-04-08 | Disposition: A | Discharge status: Home / Self Care | Payer: 59 | Attending: Emergency Medicine | Admitting: Emergency Medicine

## 2017-04-07 ENCOUNTER — Encounter: Payer: Self-pay | Admitting: Emergency Medicine

## 2017-04-07 DIAGNOSIS — C801 Malignant (primary) neoplasm, unspecified: Secondary | ICD-10-CM | POA: Diagnosis not present

## 2017-04-07 DIAGNOSIS — I890 Lymphedema, not elsewhere classified: Secondary | ICD-10-CM | POA: Insufficient documentation

## 2017-04-07 DIAGNOSIS — R740 Nonspecific elevation of levels of transaminase and lactic acid dehydrogenase [LDH]: Secondary | ICD-10-CM | POA: Diagnosis not present

## 2017-04-07 DIAGNOSIS — M79604 Pain in right leg: Secondary | ICD-10-CM | POA: Diagnosis present

## 2017-04-07 DIAGNOSIS — M7989 Other specified soft tissue disorders: Secondary | ICD-10-CM | POA: Diagnosis not present

## 2017-04-07 DIAGNOSIS — R945 Abnormal results of liver function studies: Secondary | ICD-10-CM | POA: Insufficient documentation

## 2017-04-07 DIAGNOSIS — C7951 Secondary malignant neoplasm of bone: Secondary | ICD-10-CM | POA: Diagnosis not present

## 2017-04-07 DIAGNOSIS — R609 Edema, unspecified: Secondary | ICD-10-CM | POA: Diagnosis not present

## 2017-04-07 DIAGNOSIS — R7401 Elevation of levels of liver transaminase levels: Secondary | ICD-10-CM

## 2017-04-07 DIAGNOSIS — R7989 Other specified abnormal findings of blood chemistry: Secondary | ICD-10-CM

## 2017-04-07 DIAGNOSIS — R600 Localized edema: Secondary | ICD-10-CM | POA: Diagnosis not present

## 2017-04-07 DIAGNOSIS — Z433 Encounter for attention to colostomy: Secondary | ICD-10-CM | POA: Diagnosis not present

## 2017-04-07 DIAGNOSIS — N3289 Other specified disorders of bladder: Secondary | ICD-10-CM | POA: Diagnosis not present

## 2017-04-07 DIAGNOSIS — C679 Malignant neoplasm of bladder, unspecified: Secondary | ICD-10-CM | POA: Diagnosis not present

## 2017-04-07 DIAGNOSIS — M79651 Pain in right thigh: Secondary | ICD-10-CM | POA: Diagnosis not present

## 2017-04-07 HISTORY — DX: Presence of urogenital implants: Z96.0

## 2017-04-07 LAB — COMPREHENSIVE METABOLIC PANEL
ALBUMIN: 2.2 g/dL — AB (ref 3.5–5.0)
ALK PHOS: 1303 U/L — AB (ref 38–126)
ALT: 63 U/L — ABNORMAL HIGH (ref 14–54)
ANION GAP: 7 (ref 5–15)
AST: 157 U/L — ABNORMAL HIGH (ref 15–41)
BILIRUBIN TOTAL: 3.3 mg/dL — AB (ref 0.3–1.2)
BUN: 10 mg/dL (ref 6–20)
CALCIUM: 7.6 mg/dL — AB (ref 8.9–10.3)
CO2: 26 mmol/L (ref 22–32)
Chloride: 95 mmol/L — ABNORMAL LOW (ref 101–111)
Creatinine, Ser: 0.57 mg/dL (ref 0.44–1.00)
GLUCOSE: 133 mg/dL — AB (ref 65–99)
POTASSIUM: 3.6 mmol/L (ref 3.5–5.1)
Sodium: 128 mmol/L — ABNORMAL LOW (ref 135–145)
TOTAL PROTEIN: 5.5 g/dL — AB (ref 6.5–8.1)

## 2017-04-07 LAB — CBC WITH DIFFERENTIAL/PLATELET
Basophils Absolute: 0 10*3/uL (ref 0–0.1)
Basophils Relative: 0 %
Eosinophils Absolute: 0.1 10*3/uL (ref 0–0.7)
Eosinophils Relative: 1 %
HEMATOCRIT: 29.3 % — AB (ref 35.0–47.0)
HEMOGLOBIN: 9.9 g/dL — AB (ref 12.0–16.0)
Lymphocytes Relative: 2 %
Lymphs Abs: 0.2 10*3/uL — ABNORMAL LOW (ref 1.0–3.6)
MCH: 31.3 pg (ref 26.0–34.0)
MCHC: 33.7 g/dL (ref 32.0–36.0)
MCV: 92.9 fL (ref 80.0–100.0)
Monocytes Absolute: 0.7 10*3/uL (ref 0.2–0.9)
Monocytes Relative: 10 %
NEUTROS ABS: 5.7 10*3/uL (ref 1.4–6.5)
NEUTROS PCT: 87 %
Platelets: 250 10*3/uL (ref 150–440)
RBC: 3.16 MIL/uL — AB (ref 3.80–5.20)
RDW: 19.2 % — AB (ref 11.5–14.5)
WBC: 6.6 10*3/uL (ref 3.6–11.0)

## 2017-04-07 LAB — LIPASE, BLOOD: Lipase: 14 U/L (ref 11–51)

## 2017-04-07 MED ORDER — CALCIUM CARBONATE 600 MG PO TABS: 1.0000 | ORAL_TABLET | Freq: Every day | ORAL | Status: DC

## 2017-04-07 MED ORDER — OXYCODONE HCL 5 MG PO TABS: 10.0000 mg | ORAL_TABLET | ORAL | Status: DC | PRN

## 2017-04-07 MED ORDER — MELATONIN 3 MG PO TABS: 3.0000 mg | ORAL_TABLET | Freq: Every day | ORAL | Status: DC

## 2017-04-07 MED ORDER — OXYCODONE HCL 5 MG PO TABS
10.0000 mg | ORAL_TABLET | ORAL | Status: AC
  Administered 2017-04-07: 10 mg via ORAL

## 2017-04-07 MED ORDER — ACETAMINOPHEN 325 MG PO TABS
ORAL_TABLET | ORAL | Status: AC
  Filled 2017-04-07: qty 3

## 2017-04-07 MED ORDER — SODIUM CHLORIDE 0.9 % IV BOLUS (SEPSIS)
1000.0000 mL | Freq: Once | INTRAVENOUS | Status: AC
  Administered 2017-04-07: 1000 mL via INTRAVENOUS

## 2017-04-07 MED ORDER — OXYCODONE HCL 5 MG PO TABS
ORAL_TABLET | ORAL | Status: AC
  Filled 2017-04-07: qty 2

## 2017-04-07 MED ORDER — OXYCODONE HCL 5 MG PO TABS
ORAL_TABLET | ORAL | Status: AC
Start: 2017-04-07 — End: 2017-04-07
  Administered 2017-04-07: 10 mg via ORAL
  Filled 2017-04-07: qty 2

## 2017-04-07 MED ORDER — ONDANSETRON HCL 4 MG PO TABS: 8.0000 mg | ORAL_TABLET | Freq: Three times a day (TID) | ORAL | Status: DC | PRN

## 2017-04-07 MED ORDER — FENTANYL 12 MCG/HR TD PT72: 12.5000 ug | MEDICATED_PATCH | TRANSDERMAL | Status: DC

## 2017-04-07 MED ORDER — VITAMIN D 1000 UNITS PO TABS: 1000.0000 [IU] | ORAL_TABLET | Freq: Every day | ORAL | Status: DC

## 2017-04-07 MED ORDER — HEPARIN SOD (PORK) LOCK FLUSH 100 UNIT/ML IV SOLN
INTRAVENOUS | Status: AC
  Administered 2017-04-08: 500 [IU] via INTRAVENOUS
  Filled 2017-04-07: qty 5

## 2017-04-07 MED ORDER — GABAPENTIN 300 MG PO CAPS
300.0000 mg | ORAL_CAPSULE | Freq: Three times a day (TID) | ORAL | Status: DC
  Administered 2017-04-07: 300 mg via ORAL

## 2017-04-07 MED ORDER — POTASSIUM CHLORIDE CRYS ER 20 MEQ PO TBCR: 20.0000 meq | EXTENDED_RELEASE_TABLET | Freq: Two times a day (BID) | ORAL | Status: DC

## 2017-04-07 MED ORDER — OXYCODONE HCL 5 MG PO TABS
10.0000 mg | ORAL_TABLET | ORAL | Status: DC | PRN
  Administered 2017-04-07 – 2017-04-08 (×2): 10 mg via ORAL
  Filled 2017-04-07: qty 2

## 2017-04-07 MED ORDER — OXYCODONE HCL ER 10 MG PO T12A
EXTENDED_RELEASE_TABLET | ORAL | Status: AC
  Filled 2017-04-07: qty 1

## 2017-04-07 MED ORDER — ACETAMINOPHEN 325 MG PO TABS
975.0000 mg | ORAL_TABLET | Freq: Three times a day (TID) | ORAL | Status: DC
  Administered 2017-04-07: 975 mg via ORAL

## 2017-04-07 MED ORDER — PANTOPRAZOLE SODIUM 40 MG PO TBEC: 40.0000 mg | DELAYED_RELEASE_TABLET | Freq: Every day | ORAL | Status: DC

## 2017-04-07 MED ORDER — GABAPENTIN 300 MG PO CAPS
ORAL_CAPSULE | ORAL | Status: AC
  Filled 2017-04-07: qty 1

## 2017-04-07 MED ORDER — MAGNESIUM OXIDE 400 (241.3 MG) MG PO TABS: 400.0000 mg | ORAL_TABLET | Freq: Every day | ORAL | Status: DC

## 2017-04-07 MED ORDER — POTASSIUM CHLORIDE CRYS ER 20 MEQ PO TBCR
EXTENDED_RELEASE_TABLET | ORAL | Status: AC
  Filled 2017-04-07: qty 1

## 2017-04-07 NOTE — ED Provider Notes (Signed)
[facility] Emergency Department Provider Note  ____________________________________________  Time seen: Approximately 8:42 PM  I have reviewed the triage vital signs and the nursing notes.   HISTORY  Chief Complaint Leg Pain and Leg Swelling    HPI Sylvia Jones is a 61 y.o. female who complains of right leg swelling and pain. She has multiple chronic symptoms going to metastatic cancer for which she was just hospitalized the past 3 weeks and discharged from [facility] yesterday. During this time she had an ileostomy as well as bilateral ureteral stenting and Foley catheterization that will be in place and definitely for management of small bowel obstruction and ureteral and bladder outlet obstructions.  She reports her only new symptom is that as of yesterday she is having more swelling and pain in the right thigh. She's had subacute to chronic right upper leg pain for the past month it was gradual onset but in the last 24 hours at been much more severe. Her thigh circumference was measured at about 6 or 7 cm greater than the left side today. Denies chest pain shortness of breath or fever.  Leg pain is much worse with weightbearing.     Past Medical History:  Diagnosis Date  . Arthritis   . Cancer ([place])    lymph nodes, in process of diagnosing  . Cervical disc disease    upper cervical, denied numbness / tingling BUE  . Cervical stenosis of spine   . Fracture 04/2016   left pubis bone-atypical   . Lymphadenopathy 09/05/2016   R clavical region  . Neuromuscular disorder ([place])    compressed disc in neck when 26 or 61 years old  . Post-menopausal 2011  . Primary cancer of unknown site [facility])   . Tubular adenoma   . Urinary catheter in place      Patient Active Problem List   Diagnosis Date Noted  . Hydronephrosis of right kidney   . Typhlitis 03/14/2017  . SBO (small bowel obstruction) ([place])   . Counseling regarding goals of care 12/13/2016  .  Pain and swelling of left lower extremity 12/06/2016  . Cancer, metastatic to bone ([place]) 09/14/2016  . Chemotherapy-induced nausea 09/07/2016  . Primary cancer of unknown site ([place]) 08/25/2016  . Lymphadenopathy 08/11/2016  . Cervical disc disease 03/08/2016  . Tubular adenoma 03/08/2016     Past Surgical History:  Procedure Laterality Date  . ABDOMINAL SURGERY Right    exploratory surgery related to cancer  . APPENDECTOMY  1986  . BREAST BIOPSY Right    at age 61  . CESAREAN SECTION  1988  . CESAREAN SECTION  1988  . COLONOSCOPY WITH PROPOFOL N/A 07/12/2015   Procedure: COLONOSCOPY WITH PROPOFOL;  Surgeon: Manya Silvas, MD;  Location: [facility] ENDOSCOPY;  Service: Endoscopy;  Laterality: N/A;  . CYSTOSCOPY W/ RETROGRADES  03/15/2017   Procedure: CYSTOSCOPY WITH RETROGRADE PYELOGRAM;  Surgeon: Hollice Espy, MD;  Location: [facility] ORS;  Service: Urology;;  . Consuela Mimes WITH BIOPSY  03/15/2017   Procedure: CYSTOSCOPY WITH BIOPSY;  Surgeon: Hollice Espy, MD;  Location: [facility] ORS;  Service: Urology;;  . Consuela Mimes WITH FULGERATION  03/15/2017   Procedure: Consuela Mimes WITH FULGERATION;  Surgeon: Hollice Espy, MD;  Location: [facility] ORS;  Service: Urology;;  . Consuela Mimes WITH STENT PLACEMENT Right 03/15/2017   Procedure: CYSTOSCOPY WITH STENT PLACEMENT;  Surgeon: Hollice Espy, MD;  Location: [facility] ORS;  Service: Urology;  Laterality: Right;  . EXPLORATORY LAPAROTOMY  1986  . ILEOSTOMY    .  kidney stents    . LYMPH GLAND EXCISION    . MASS BIOPSY N/A 09/05/2016   Procedure: NECK MASS BIOPSY;  Surgeon: Beverly Gust, MD;  Location: [facility] ORS;  Service: ENT;  Laterality: N/A;  . MASS EXCISION  1986   to remove mass outside the uterus. procedure was combined tacked uterus, reposition of fallopian tubes   . PORTACATH PLACEMENT Left 09/20/2016   Procedure: INSERTION PORT-A-CATH;  Surgeon: Olean Ree, MD;  Location: [facility] ORS;  Service: General;  Laterality: Left;  . THORACOTOMY Right 03/2008    RLL Wedge Resection- Dr. Genevive Bi     Prior to Admission medications   Not on File     Allergies Sulfa antibiotics   Family History  Problem Relation Age of Onset  . Cancer Father        medistinal  carcinoma  . Heart attack Father   . Hypertension Brother   . Diabetes Maternal Grandmother   . Diabetes Paternal Grandmother   . Cancer Brother        Capa C sarcoma    Social History Social History  Substance Use Topics  . Smoking status: Never Smoker  . Smokeless tobacco: Never Used  . Alcohol use No    Review of Systems  Constitutional:   No fever or chills.  ENT:   No sore throat. No rhinorrhea. Lymphatic: No swollen glands, No extremity swelling Endocrine: No hot/cold flashes. No significant weight change. No neck swelling. Cardiovascular:   No chest pain or syncope. Respiratory:   No dyspnea or cough. Gastrointestinal:   Chronic abdominal pain without vomiting or diarrhea. Ileostomy.  Genitourinary:   Chronic Foley catheter. Musculoskeletal:   Acute on chronic right upper leg pain Neurological:   Negative for headaches or weakness. All other systems reviewed and are negative except as documented above in ROS and HPI.  ____________________________________________   PHYSICAL EXAM:  VITAL SIGNS: ED Triage Vitals  Enc Vitals Group     BP 04/07/17 1809 111/64     Pulse Rate 04/07/17 1809 (!) 101     Resp 04/07/17 1809 18     Temp 04/07/17 1809 98 F (36.7 C)     Temp Source 04/07/17 1809 Oral     SpO2 04/07/17 1809 97 %     Weight 04/07/17 1810 110 lb (49.9 kg)     Height 04/07/17 1810 5\' 5"  (1.651 m)     Head Circumference --      Peak Flow --      Pain Score 04/07/17 1809 5     Pain Loc --      Pain Edu? --      Excl. in [place]? --     Vital signs reviewed, nursing assessments reviewed.   Constitutional:   Alert and oriented. Well appearing and in no distress. Eyes:   Slight scleral icterus. No conjunctival pallor. PERRL. EOMI.  No  nystagmus. ENT   Head:   Normocephalic and atraumatic.   Nose:   No congestion/rhinnorhea. No septal hematoma   Mouth/Throat:   MMM, no pharyngeal erythema. No peritonsillar mass.    Neck:   No stridor. No SubQ emphysema. No meningismus. Hematological/Lymphatic/Immunilogical:   No cervical lymphadenopathy. Cardiovascular:   RRR. Symmetric bilateral radial and DP pulses.  No murmurs.  Respiratory:   Normal respiratory effort without tachypnea nor retractions. Breath sounds are clear and equal bilaterally. No wheezes/rales/rhonchi. Gastrointestinal:   Soft with mild generalized tenderness, chronic per patient. Non distended. There is no CVA tenderness.  No rebound, rigidity, or guarding. Ileostomy in place. Genitourinary:   Foley catheter. Musculoskeletal:   Normal range of motion in all extremities. No joint effusions.  No edema. Right upper thigh tenderness and hip tenderness.  Neurologic:   Normal speech and language.  CN 2-10 normal. Motor grossly intact. No gross focal neurologic deficits are appreciated.  Skin:    Skin is warm, dry and intact. No rash noted.  No petechiae, purpura, or bullae. Jaundiced appearance  ____________________________________________    LABS (pertinent positives/negatives) (all labs ordered are listed, but only abnormal results are displayed) Labs Reviewed  COMPREHENSIVE METABOLIC PANEL - Abnormal; Notable for the following:       Result Value   Sodium 128 (*)    Chloride 95 (*)    Glucose, Bld 133 (*)    Calcium 7.6 (*)    Total Protein 5.5 (*)    Albumin 2.2 (*)    AST 157 (*)    ALT 63 (*)    Alkaline Phosphatase 1,303 (*)    Total Bilirubin 3.3 (*)    All other components within normal limits  CBC WITH DIFFERENTIAL/PLATELET - Abnormal; Notable for the following:    RBC 3.16 (*)    Hemoglobin 9.9 (*)    HCT 29.3 (*)    RDW 19.2 (*)    Lymphs Abs 0.2 (*)    All other components within normal limits  LIPASE, BLOOD    ____________________________________________   EKG    ____________________________________________    RADIOLOGY  US Venous Img Lower Unilateral Right  Result Date: 04/07/2017 CLINICAL DATA:  Right lower extremity pain and swelling for 1 month. EXAM: RIGHT LOWER EXTREMITY VENOUS DOPPLER ULTRASOUND TECHNIQUE: Gray-scale sonography with graded compression, as well as color Doppler and duplex ultrasound were performed to evaluate the lower extremity deep venous systems from the level of the common femoral vein and including the common femoral, femoral, profunda femoral, popliteal and calf veins including the posterior tibial, peroneal and gastrocnemius veins when visible. The superficial great saphenous vein was also interrogated. Spectral Doppler was utilized to evaluate flow at rest and with distal augmentation maneuvers in the common femoral, femoral and popliteal veins. COMPARISON:  None. FINDINGS: Contralateral Common Femoral Vein: Respiratory phasicity is normal and symmetric with the symptomatic side. No evidence of thrombus. Normal compressibility. Common Femoral Vein: No evidence of thrombus. Normal compressibility, respiratory phasicity and response to augmentation. Saphenofemoral Junction: No evidence of thrombus. Normal compressibility and flow on color Doppler imaging. Profunda Femoral Vein: No evidence of thrombus. Normal compressibility and flow on color Doppler imaging. Femoral Vein: No evidence of thrombus. Normal compressibility, respiratory phasicity and response to augmentation. Popliteal Vein: No evidence of thrombus. Normal compressibility, respiratory phasicity and response to augmentation. Calf Veins: No evidence of thrombus. Normal compressibility and flow on color Doppler imaging. Superficial Great Saphenous Vein: No evidence of thrombus. Normal compressibility and flow on color Doppler imaging. Venous Reflux:  None. Other Findings: A normal size right inguinal lymph node  measuring 0.5 cm short axis is demonstrated, without overtly suspicious features. IMPRESSION: No evidence of DVT within the right lower extremity. Electronically Signed   By: Ilona Sorrel M.D.   On: 04/07/2017 19:11   Dg Hip Unilat W Or Wo Pelvis 2-3 Views Right  Result Date: 04/07/2017 CLINICAL DATA:  Chronic worsening right upper leg swelling and pain. Initial encounter. EXAM: DG HIP (WITH OR WITHOUT PELVIS) 2-3V RIGHT COMPARISON:  CT of the abdomen and pelvis from 03/14/2017 FINDINGS: There is no  evidence of fracture or dislocation. Both femoral heads are seated normally within their respective acetabula. The proximal right femur appears intact. Mild chronic sclerosis is noted at the left pubic symphysis and left superior pubic ramus. The sacroiliac joints are unremarkable in appearance. The visualized bowel gas pattern is grossly unremarkable in appearance. Postoperative change is noted overlying the abdomen. Bilateral ureteral stents are visualized. Mild diffuse soft tissue swelling is noted. IMPRESSION: 1. No evidence of fracture or dislocation. 2. Mild chronic sclerosis of the left pubic symphysis and left superior pubic ramus again noted. 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 3. Mild diffuse soft tissue swelling. Electronically Signed   By: Garald Balding M.D.   On: 04/07/2017 19:58    ____________________________________________   PROCEDURES Procedures  ____________________________________________   INITIAL IMPRESSION / ASSESSMENT AND PLAN / ED COURSE  Pertinent labs & imaging results that were available during my care of the patient were reviewed by me and considered in my medical decision making (see chart for details).  Patient presents with right upper leg pain in the setting of metastatic cancer that is having some obstructive and compressive mass effects on other organs. Concern for DVT, but ultrasound of the right lower extremity is negative for DVT. X-ray of the right hip and  pelvis unremarkable and does not reveal any fracture. I'll proceed with a CT scan of the pelvis given her pain with weightbearing and elevated risk for pathologic fractures. Labs show hyponatremia to 128 which we will treat with IV saline for now. LFTs are abnormal, alkaline phosphatase is markedly elevated likely due to bony metastases.    ----------------------------------------- 11:11 PM on 04/07/2017 -----------------------------------------  Patient remains in good spirits, well appearing, symptoms well controlled with her usual home medications. For the right leg pain, ultrasound is negative for DVT. X-ray of the right hip and CT of the pelvis are negative for fractures.  With the elevated LFTs and her history of cholelithiasis, we'll get an ultrasound of the right upper quadrant to evaluate the gallbladder and liver. She does have some tenderness in the area which she reports is chronic and on concerning for her.  Asian family updated on plan. If no severe findings on the ultrasound and the patient is suitable for outpatient follow-up with primary care in 2 days.       ____________________________________________   FINAL CLINICAL IMPRESSION(S) / ED DIAGNOSES  Final diagnoses:  Right leg pain  Swelling      New Prescriptions   No medications on file     Portions of this note were generated with dragon dictation software. Dictation errors may occur despite best attempts at proofreading.    Carrie Mew, MD 04/07/17 [phone]

## 2017-04-07 NOTE — ED Notes (Signed)
Pt taken to Ultrasound. MD at bedside with family.

## 2017-04-07 NOTE — ED Notes (Signed)
MD at bedside with pt and with family to update.

## 2017-04-07 NOTE — ED Triage Notes (Signed)
Patient presents to the ED with right upper leg swelling and pain.  Patient states she has had pain with walking to her right upper leg for about 1 month but today leg was obviously swollen and is more painful.  Patient had an right sided abdominal surgery about 1 week ago and has been on heparin post surgery.  Patient was released from the hospital yesterday.  Patient's skin has a slightly yellow tinge.  Patient has a new ileostomy and is catheterized.  Patient is being treated for a cancer of an unknown origin.

## 2017-04-08 ENCOUNTER — Encounter: Payer: Self-pay | Admitting: Internal Medicine

## 2017-04-08 DIAGNOSIS — C801 Malignant (primary) neoplasm, unspecified: Secondary | ICD-10-CM | POA: Diagnosis not present

## 2017-04-08 DIAGNOSIS — C7951 Secondary malignant neoplasm of bone: Secondary | ICD-10-CM | POA: Diagnosis not present

## 2017-04-08 DIAGNOSIS — R740 Nonspecific elevation of levels of transaminase and lactic acid dehydrogenase [LDH]: Secondary | ICD-10-CM | POA: Diagnosis not present

## 2017-04-08 DIAGNOSIS — I890 Lymphedema, not elsewhere classified: Secondary | ICD-10-CM | POA: Diagnosis not present

## 2017-04-08 DIAGNOSIS — R945 Abnormal results of liver function studies: Secondary | ICD-10-CM | POA: Diagnosis not present

## 2017-04-08 DIAGNOSIS — K8020 Calculus of gallbladder without cholecystitis without obstruction: Secondary | ICD-10-CM | POA: Diagnosis not present

## 2017-04-08 DIAGNOSIS — R609 Edema, unspecified: Secondary | ICD-10-CM | POA: Diagnosis not present

## 2017-04-08 MED ORDER — ONDANSETRON HCL 4 MG/2ML IJ SOLN
4.0000 mg | Freq: Once | INTRAMUSCULAR | Status: AC
  Administered 2017-04-08: 4 mg via INTRAVENOUS
  Filled 2017-04-08: qty 2

## 2017-04-08 MED ORDER — GI COCKTAIL ~~LOC~~
30.0000 mL | Freq: Once | ORAL | Status: AC
  Administered 2017-04-08: 30 mL via ORAL
  Filled 2017-04-08: qty 30

## 2017-04-08 MED ORDER — HEPARIN SOD (PORK) LOCK FLUSH 100 UNIT/ML IV SOLN
500.0000 [IU] | Freq: Once | INTRAVENOUS | Status: AC
  Administered 2017-04-08: 500 [IU] via INTRAVENOUS

## 2017-04-08 NOTE — Discharge Instructions (Signed)
Please return to the emergency department immediately for any new or worsening symptoms such as if you cannot eat or drink, if he developed fevers or chills, or for any other concerns whatsoever. Please make sure you keep her appointment to see her primary care physician this coming Monday for reevaluation.  It was a pleasure to take care of you today, and thank you for coming to our emergency department.  If you have any questions or concerns before leaving please ask the nurse to grab me and I'm more than happy to go through your aftercare instructions again.  If you were prescribed any opioid pain medication today such as Norco, Vicodin, Percocet, morphine, hydrocodone, or oxycodone please make sure you do not drive when you are taking this medication as it can alter your ability to drive safely.  If you have any concerns once you are home that you are not improving or are in fact getting worse before you can make it to your follow-up appointment, please do not hesitate to call 911 and come back for further evaluation.  Darel Hong MD  Results for orders placed or performed during the hospital encounter of 04/07/17  Comprehensive metabolic panel  Result Value Ref Range   Sodium 128 (L) 135 - 145 mmol/L   Potassium 3.6 3.5 - 5.1 mmol/L   Chloride 95 (L) 101 - 111 mmol/L   CO2 26 22 - 32 mmol/L   Glucose, Bld 133 (H) 65 - 99 mg/dL   BUN 10 6 - 20 mg/dL   Creatinine, Ser 0.57 0.44 - 1.00 mg/dL   Calcium 7.6 (L) 8.9 - 10.3 mg/dL   Total Protein 5.5 (L) 6.5 - 8.1 g/dL   Albumin 2.2 (L) 3.5 - 5.0 g/dL   AST 157 (H) 15 - 41 U/L   ALT 63 (H) 14 - 54 U/L   Alkaline Phosphatase 1,303 (H) 38 - 126 U/L   Total Bilirubin 3.3 (H) 0.3 - 1.2 mg/dL   GFR calc non Af Amer >60 >60 mL/min   GFR calc Af Amer >60 >60 mL/min   Anion gap 7 5 - 15  CBC with Differential  Result Value Ref Range   WBC 6.6 3.6 - 11.0 K/uL   RBC 3.16 (L) 3.80 - 5.20 MIL/uL   Hemoglobin 9.9 (L) 12.0 - 16.0 g/dL   HCT 29.3  (L) 35.0 - 47.0 %   MCV 92.9 80.0 - 100.0 fL   MCH 31.3 26.0 - 34.0 pg   MCHC 33.7 32.0 - 36.0 g/dL   RDW 19.2 (H) 11.5 - 14.5 %   Platelets 250 150 - 440 K/uL   Neutrophils Relative % 87 %   Neutro Abs 5.7 1.4 - 6.5 K/uL   Lymphocytes Relative 2 %   Lymphs Abs 0.2 (L) 1.0 - 3.6 K/uL   Monocytes Relative 10 %   Monocytes Absolute 0.7 0.2 - 0.9 K/uL   Eosinophils Relative 1 %   Eosinophils Absolute 0.1 0 - 0.7 K/uL   Basophils Relative 0 %   Basophils Absolute 0.0 0 - 0.1 K/uL  Lipase, blood  Result Value Ref Range   Lipase 14 11 - 51 U/L   Dg Abd 1 View  Result Date: 03/15/2017 CLINICAL DATA:  Right ureteral stent placement. EXAM: ABDOMEN - 1 VIEW; DG C-ARM 61-120 MIN COMPARISON:  Previous relevant examinations. FINDINGS: Three C-arm views of the right pelvis and abdomen are submitted for interpretation. These demonstrate retrograde opacification of the right ureter. There is mild irregularity  and marked narrowing of the distal ureter. Subsequent images demonstrate a the right ureteral stent in satisfactory position with persistent contrast in the right ureter above the level of irregularity and narrowing. IMPRESSION: Irregularity and narrowing of the distal right ureter, suspicious for the possibility of a neoplasm involving the distal ureter. Subsequent placement of a right ureteral stent in satisfactory position. Electronically Signed   By: Claudie Revering M.D.   On: 03/15/2017 15:16   Dg Abd 1 View  Result Date: 03/15/2017 CLINICAL DATA:  61 year old female with small bowel obstruction, abnormal terminal ileum. Carcinoma of unknown primary, lytic bone lesions and metastatic lymphadenopathy including in the neck. EXAM: ABDOMEN - 1 VIEW COMPARISON:  CT Abdomen and Pelvis and radiographs 03/14/2017 and earlier. FINDINGS: KUB view of the abdomen at 0800 hours. Enteric tube side hole remains at the level of the proximal gastric body. There is some residual excreted IV contrast suspected in the  right renal collecting system, as well as the right ureter and bladder. The left renal collecting system and ureter are no longer visible. Bowel gas pattern has not significantly changed since 03/14/2017 although the oral contrast administered at that time is now throughout the colon. Stable visualized osseous structures. IMPRESSION: 1. No significant change in the bowel gas pattern since the CT on 03/14/2017, but the oral contrast administered at that time is now throughout the colon indicating a partial small bowel obstruction. 2. Abnormal persistence of IV contrast in the right renal collecting system, right ureter and bladder. Suspect distal right ureter obstruction as on the recent CT. Consider cystoscopy for evaluation of the right UVJ. 3. Stable enteric tube. Electronically Signed   By: Genevie Ann M.D.   On: 03/15/2017 08:37   Dg Abdomen 1 View  Result Date: 03/14/2017 CLINICAL DATA:  Acute abdominal pain, concern for obstruction or free abdominal air EXAM: ABDOMEN - 1 VIEW COMPARISON:  None. FINDINGS: No definite evidence of free abdominal air. Gaseous distended bowel loops are noted mid and left lower abdomen with some air-fluid levels. Ileus or early bowel obstruction cannot be excluded. IMPRESSION: Gaseous distended bowel loops are noted mid and left lower abdomen with some air-fluid levels. Ileus or early bowel obstruction cannot be excluded. Electronically Signed   By: Lahoma Crocker M.D.   On: 03/14/2017 12:28   Ct Soft Tissue Neck W Contrast  Result Date: 03/09/2017 CLINICAL DATA:  Carcinoma of unknown primary. Increasing prominence in the left submandibular region. EXAM: CT NECK WITH CONTRAST TECHNIQUE: Multidetector CT imaging of the neck was performed using the standard protocol following the bolus administration of intravenous contrast. CONTRAST:  1103mL ISOVUE-300 IOPAMIDOL (ISOVUE-300) INJECTION 61% COMPARISON:  01/29/2017.  12/11/2016.  11/10/2016. FINDINGS: Pharynx and larynx: No mucosal or  submucosal lesion. Salivary glands: Submandibular glands are normal. Parotid glands are intrinsically normal. Parotid lymph node inferiorly on the left has enlarged slightly, measuring 9.3 mm in diameter as opposed to 7.7 mm in diameter previously. Thyroid: Normal Lymph nodes: Level 2 lymph node on the left is stable over the last 3 scans, short axis diameter 1 cm. Other lymph nodes previously seen in the left neck that improved following therapy have not shown evidence of recurrent enlargement. No newly abnormal nodes. Vascular: Patent and normal Limited intracranial: Normal Visualized orbits: Normal Mastoids and visualized paranasal sinuses: Clear Skeleton: Healed pathologic fracture of the spinous process of C7 with sclerosis. No lytic change or extraosseous tumor. Sclerotic metastatic disease within the T2 vertebral body does not show any evidence  of progression. No soft tissue mass. No new lesion. Upper chest: See results of chest CT IMPRESSION: The only progressive change since the previous study is slight enlargement of a left parotid lymph node inferiorly in the superficial lobe, measuring 9.3 mm in diameter as opposed 7.7 mm previously. This change is of concern but not definitely significant. Follow on subsequent scans. No change of an enlarged level 2 node on the left with short axis diameter of 10 mm. Other treated regressed nodes appear stable. No progressive bone change. Sclerotic changes of the spinous process of C7 and the T2 vertebral body. Electronically Signed   By: Nelson Chimes M.D.   On: 03/09/2017 16:07   Ct Chest W Contrast  Result Date: 03/09/2017 CLINICAL DATA:  Carcinoma of unknown primary. EXAM: CT CHEST, ABDOMEN, AND PELVIS WITH CONTRAST TECHNIQUE: Multidetector CT imaging of the chest, abdomen and pelvis was performed following the standard protocol during bolus administration of intravenous contrast. CONTRAST:  119mL ISOVUE-300 IOPAMIDOL (ISOVUE-300) INJECTION 61% COMPARISON:   01/29/2017 FINDINGS: CT CHEST FINDINGS Cardiovascular: The heart size appears within normal limits. No pericardial effusion identified. Aortic atherosclerosis. Mediastinum/Nodes: The trachea appears patent and is midline. Normal appearance of the esophagus. The sub- carinal nodal mass measures 1.6 x 1.4 cm, image 24 of series 3. Previously 1.8 x 1.8 cm. Right hilar node measures 1 cm, image 29 of series 3. Previously 1.1 cm Lungs/Pleura: No pleural effusion. Scarring within the right lower lobe identified. Unchanged from previous exam. No suspicious pulmonary nodules identified. 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 Sclerotic metastases involving the T3 vertebra is increased in size. Currently 2.6 x 1.6 cm, image 21 of series 6. Previously 1.8 x 1.2 cm. Sclerotic metastasis involving the left first rib is unchanged. CT ABDOMEN PELVIS FINDINGS Hepatobiliary: There is no suspicious liver abnormality. Multiple stones are again noted within the gallbladder. There is mild diffuse gallbladder wall edema. Similar to previous exam. Pancreas: Unremarkable. No pancreatic ductal dilatation or surrounding inflammatory changes. Spleen: Normal in size without focal abnormality. Adrenals/Urinary Tract: The adrenal glands appear normal. Normal appearance of the left kidney. There is progressive right-sided hydronephrosis and hydroureter. Irregular bladder wall thickening is identified and appears progressive when compared with 01/29/2017. Stomach/Bowel: Small hiatal hernia. The stomach appears unremarkable. The proximal small bowel loops appear unremarkable. Again noted is abnormal bowel wall thickening involving the right lower quadrant bowel loops including the ileocecal valve and cecum. The appearance is similar to the previous examination, image 95 of series 3. No resulting bowel obstruction. No additional colonic abnormalities identified. Vascular/Lymphatic: Normal appearance of  the abdominal aorta. No upper abdominal adenopathy. Right external iliac lymph node is unchanged measuring 8 mm, image 103 of series 3. Reproductive: The uterus and adnexal structures are unremarkable. Other: No free fluid or fluid collections identified. Musculoskeletal: There is abnormal asymmetric sclerosis involving the left superior pubic rami. This is unchanged when compared with the previous exam, image 112 of series 3. IMPRESSION: 1. Mild progressive improvement in mediastinal and right hilar adenopathy. No evidence for recurrent abdominal adenopathy. 2. Increase in T3 vertebral body sclerotic metastases. Similar appearance of sclerotic metastasis is involving the left first rib and left superior pubic rami. 3. Persistent abnormal wall thickening involving the ileocecal valve and cecum. Presumably inflammatory. 4. Abnormal bladder wall irregularity appears progressive when compared with previous exam and there is increase in right-sided hydronephrosis and hydroureter. Correlate for any clinical signs or symptoms of urinary tract infection/cystitis. 5. Gallstones with  mild chronic gallbladder wall thickening. Electronically Signed   By: Kerby Moors M.D.   On: 03/09/2017 17:19   Ct Pelvis Wo Contrast  Result Date: 04/07/2017 CLINICAL DATA:  Right upper leg swelling and pain. Pain of the right hip with weight-bearing. History of right-sided abdominal surgery 1 week ago and on heparin post surgery. History of cancer of unknown origin with bone involvement. EXAM: CT PELVIS WITHOUT CONTRAST TECHNIQUE: Multidetector CT imaging of the pelvis was performed following the standard protocol without intravenous contrast. COMPARISON:  CT abdomen and pelvis 03/14/2017 FINDINGS: Urinary Tract: Bladder is decompressed with a Foley catheter. Bilateral ureteral stents are present with pigtails in the bladder. Small amount of gas in the bladder is likely due to instrumentation. Bladder is decompressed but the bladder  wall appears thickened. Bowel: Since the previous study, there is a right lower quadrant ileostomy. Small bowel are decompressed. Can't exclude wall thickening in the cecum. Small amount of residual contrast is demonstrated within bowel. Vascular/Lymphatic: Evaluation is limited on noncontrast examination but no significant aneurysm is demonstrated in the iliac or femoral vessels. Reproductive:  Uterus and ovaries are not enlarged. Other: There is diffuse soft tissue edema throughout the visualized pelvis. No loculated collections. Surgical clips across the midline consistent with recent surgery. Small amount of free fluid in the pelvis is probably postoperative but could be reactive. Visualized lymph nodes are not pathologically enlarged. Musculoskeletal: Asymmetrical sclerosis is demonstrated in the left symphysis pubis which could represent infiltrating bone lesion. No expansile change or cortical destruction. Pelvis appears otherwise intact. Sacrum appears intact. SI joints and symphysis pubis are not displaced. Both hips appear intact. No evidence of acute fracture or dislocation. Focal benign-appearing sclerosis in the right femoral head. IMPRESSION: No evidence of acute fracture or dislocation of the right hip. Poorly defined sclerosis in the left symphysis pubis suggest infiltrative bone lesion, possibly metastatic, given the history. Foley catheter and ureteral stents in the bladder. Bladder wall appears thickened. Bowel are decompressed with a right lower quadrant ileostomy present. Can't exclude bowel wall thickening. Diffuse edema throughout the subcutaneous soft tissues of the pelvis. Electronically Signed   By: Lucienne Capers M.D.   On: 04/07/2017 21:55   Ct Abdomen Pelvis W Contrast  Result Date: 03/14/2017 CLINICAL DATA:  Abdominal bloating, right upper quadrant pain and elevated white blood cell count. History of metastatic carcinoma of unknown primary. EXAM: CT ABDOMEN AND PELVIS WITH  CONTRAST TECHNIQUE: Multidetector CT imaging of the abdomen and pelvis was performed using the standard protocol following bolus administration of intravenous contrast. CONTRAST:  75 ml ISOVUE-300 IOPAMIDOL (ISOVUE-300) INJECTION 61% COMPARISON:  CT chest, abdomen and pelvis 03/09/2017 and 01/29/2017. FINDINGS: Lower chest: No pleural or pericardial effusion. Small pleural base calcification right lower lobe noted. Mild dependent atelectasis. Fluid filled and dilated esophagus is noted. Hepatobiliary: Several small stones are seen in the neck of the gallbladder. No focal liver lesion. The biliary tree is unremarkable. Pancreas: Unremarkable. No pancreatic ductal dilatation or surrounding inflammatory changes. Spleen: Normal in size without focal abnormality. Adrenals/Urinary Tract: There is severe right hydronephrosis which is worse than on the most recent comparison with an associated delayed nephrogram. The urinary bladder is incompletely distended with irregular thickening of its walls. The right ureter may be obstruction at the UVJ of the urinary bladder wall. No obstructing stone is seen. The left kidney appears normal. Adrenal glands are normal. Stomach/Bowel: The patient has a high grade small bowel obstruction. Transition point is at the terminal ileum.  Walls of the terminal ileum are markedly an irregularly thickened. There is little to no surrounding inflammatory change. No pneumatosis or portal venous gas. No fatty proliferation of the right lower quadrant. The colon is nearly completely decompressed. The stomach is markedly distended with fluid. Vascular/Lymphatic: No lymphadenopathy. Reproductive: Uterus and bilateral adnexa are unremarkable. Other: Small volume of free pelvic fluid is noted. Musculoskeletal: No acute bony abnormality. No focal lesion is identified. IMPRESSION: High-grade small bowel obstruction with a transition point at the terminal ileum. Markedly thickened and irregular walls of the  terminal ileum are identified and could be due to inflammatory change such as Crohn's disease but the appearance is also worrisome for mass lesion/small bowel neoplasm. Severe right hydronephrosis is worsened since the prior examination. No obstructing stone is identified. Obstruction may be at the right UVJ as the urinary bladder walls are markedly thickened and irregular. Bladder neoplasm or cystitis are possible. Gallstones without evidence cholecystitis. Electronically Signed   By: Inge Rise M.D.   On: 03/14/2017 13:59   Ct Abdomen Pelvis W Contrast  Result Date: 03/09/2017 CLINICAL DATA:  Carcinoma of unknown primary. EXAM: CT CHEST, ABDOMEN, AND PELVIS WITH CONTRAST TECHNIQUE: Multidetector CT imaging of the chest, abdomen and pelvis was performed following the standard protocol during bolus administration of intravenous contrast. CONTRAST:  159mL ISOVUE-300 IOPAMIDOL (ISOVUE-300) INJECTION 61% COMPARISON:  01/29/2017 FINDINGS: CT CHEST FINDINGS Cardiovascular: The heart size appears within normal limits. No pericardial effusion identified. Aortic atherosclerosis. Mediastinum/Nodes: The trachea appears patent and is midline. Normal appearance of the esophagus. The sub- carinal nodal mass measures 1.6 x 1.4 cm, image 24 of series 3. Previously 1.8 x 1.8 cm. Right hilar node measures 1 cm, image 29 of series 3. Previously 1.1 cm Lungs/Pleura: No pleural effusion. Scarring within the right lower lobe identified. Unchanged from previous exam. No suspicious pulmonary nodules identified. 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 Sclerotic metastases involving the T3 vertebra is increased in size. Currently 2.6 x 1.6 cm, image 21 of series 6. Previously 1.8 x 1.2 cm. Sclerotic metastasis involving the left first rib is unchanged. CT ABDOMEN PELVIS FINDINGS Hepatobiliary: There is no suspicious liver abnormality. Multiple stones are again noted within the  gallbladder. There is mild diffuse gallbladder wall edema. Similar to previous exam. Pancreas: Unremarkable. No pancreatic ductal dilatation or surrounding inflammatory changes. Spleen: Normal in size without focal abnormality. Adrenals/Urinary Tract: The adrenal glands appear normal. Normal appearance of the left kidney. There is progressive right-sided hydronephrosis and hydroureter. Irregular bladder wall thickening is identified and appears progressive when compared with 01/29/2017. Stomach/Bowel: Small hiatal hernia. The stomach appears unremarkable. The proximal small bowel loops appear unremarkable. Again noted is abnormal bowel wall thickening involving the right lower quadrant bowel loops including the ileocecal valve and cecum. The appearance is similar to the previous examination, image 95 of series 3. No resulting bowel obstruction. No additional colonic abnormalities identified. Vascular/Lymphatic: Normal appearance of the abdominal aorta. No upper abdominal adenopathy. Right external iliac lymph node is unchanged measuring 8 mm, image 103 of series 3. Reproductive: The uterus and adnexal structures are unremarkable. Other: No free fluid or fluid collections identified. Musculoskeletal: There is abnormal asymmetric sclerosis involving the left superior pubic rami. This is unchanged when compared with the previous exam, image 112 of series 3. IMPRESSION: 1. Mild progressive improvement in mediastinal and right hilar adenopathy. No evidence for recurrent abdominal adenopathy. 2. Increase in T3 vertebral body sclerotic metastases. Similar appearance of sclerotic metastasis  is involving the left first rib and left superior pubic rami. 3. Persistent abnormal wall thickening involving the ileocecal valve and cecum. Presumably inflammatory. 4. Abnormal bladder wall irregularity appears progressive when compared with previous exam and there is increase in right-sided hydronephrosis and hydroureter. Correlate  for any clinical signs or symptoms of urinary tract infection/cystitis. 5. Gallstones with mild chronic gallbladder wall thickening. Electronically Signed   By: Kerby Moors M.D.   On: 03/09/2017 17:19   US Venous Img Lower Unilateral Right  Result Date: 04/07/2017 CLINICAL DATA:  Right lower extremity pain and swelling for 1 month. EXAM: RIGHT LOWER EXTREMITY VENOUS DOPPLER ULTRASOUND TECHNIQUE: Gray-scale sonography with graded compression, as well as color Doppler and duplex ultrasound were performed to evaluate the lower extremity deep venous systems from the level of the common femoral vein and including the common femoral, femoral, profunda femoral, popliteal and calf veins including the posterior tibial, peroneal and gastrocnemius veins when visible. The superficial great saphenous vein was also interrogated. Spectral Doppler was utilized to evaluate flow at rest and with distal augmentation maneuvers in the common femoral, femoral and popliteal veins. COMPARISON:  None. FINDINGS: Contralateral Common Femoral Vein: Respiratory phasicity is normal and symmetric with the symptomatic side. No evidence of thrombus. Normal compressibility. Common Femoral Vein: No evidence of thrombus. Normal compressibility, respiratory phasicity and response to augmentation. Saphenofemoral Junction: No evidence of thrombus. Normal compressibility and flow on color Doppler imaging. Profunda Femoral Vein: No evidence of thrombus. Normal compressibility and flow on color Doppler imaging. Femoral Vein: No evidence of thrombus. Normal compressibility, respiratory phasicity and response to augmentation. Popliteal Vein: No evidence of thrombus. Normal compressibility, respiratory phasicity and response to augmentation. Calf Veins: No evidence of thrombus. Normal compressibility and flow on color Doppler imaging. Superficial Great Saphenous Vein: No evidence of thrombus. Normal compressibility and flow on color Doppler imaging.  Venous Reflux:  None. Other Findings: A normal size right inguinal lymph node measuring 0.5 cm short axis is demonstrated, without overtly suspicious features. IMPRESSION: No evidence of DVT within the right lower extremity. Electronically Signed   By: Ilona Sorrel M.D.   On: 04/07/2017 19:11   Dg Abd Portable 1 View  Result Date: 03/14/2017 CLINICAL DATA:  NG tube placement. EXAM: PORTABLE ABDOMEN - 1 VIEW COMPARISON:  CT scan 03/14/2017 FINDINGS: The NG tube tip is in the body region of the stomach. Persistent contrast noted in both collecting systems with right-sided hydroureteronephrosis. The lung bases are grossly clear. IMPRESSION: The NG tube tip is in the body region of the stomach. Electronically Signed   By: Marijo Sanes M.D.   On: 03/14/2017 16:51   Dg C-arm 1-60 Min  Result Date: 03/15/2017 CLINICAL DATA:  Right ureteral stent placement. EXAM: ABDOMEN - 1 VIEW; DG C-ARM 61-120 MIN COMPARISON:  Previous relevant examinations. FINDINGS: Three C-arm views of the right pelvis and abdomen are submitted for interpretation. These demonstrate retrograde opacification of the right ureter. There is mild irregularity and marked narrowing of the distal ureter. Subsequent images demonstrate a the right ureteral stent in satisfactory position with persistent contrast in the right ureter above the level of irregularity and narrowing. IMPRESSION: Irregularity and narrowing of the distal right ureter, suspicious for the possibility of a neoplasm involving the distal ureter. Subsequent placement of a right ureteral stent in satisfactory position. Electronically Signed   By: Claudie Revering M.D.   On: 03/15/2017 15:16   Dg Hip Unilat W Or Wo Pelvis 2-3 Views Right  Result  Date: 04/07/2017 CLINICAL DATA:  Chronic worsening right upper leg swelling and pain. Initial encounter. EXAM: DG HIP (WITH OR WITHOUT PELVIS) 2-3V RIGHT COMPARISON:  CT of the abdomen and pelvis from 03/14/2017 FINDINGS: There is no evidence of  fracture or dislocation. Both femoral heads are seated normally within their respective acetabula. The proximal right femur appears intact. Mild chronic sclerosis is noted at the left pubic symphysis and left superior pubic ramus. The sacroiliac joints are unremarkable in appearance. The visualized bowel gas pattern is grossly unremarkable in appearance. Postoperative change is noted overlying the abdomen. Bilateral ureteral stents are visualized. Mild diffuse soft tissue swelling is noted. IMPRESSION: 1. No evidence of fracture or dislocation. 2. Mild chronic sclerosis of the left pubic symphysis and left superior pubic ramus again noted. 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 3. Mild diffuse soft tissue swelling. Electronically Signed   By: Garald Balding M.D.   On: 04/07/2017 19:58   US Abdomen Limited Ruq  Result Date: 04/08/2017 CLINICAL DATA:  Right upper quadrant pain and jaundice for 2 hours. Elevated liver function studies. Right-sided abdominal surgery 1 week ago. EXAM: US ABDOMEN LIMITED - RIGHT UPPER QUADRANT COMPARISON:  CT abdomen and pelvis 03/14/2017 FINDINGS: Gallbladder: Tiny echogenic stones in the gallbladder, largest measuring about 4 mm in diameter. Layering sludge. Mild wall thickening of the gallbladder. Murphy's sign is negative. The gallbladder neck is not visualized. Common bile duct: Diameter: 4.7 mm, normal Liver: No focal lesion identified. Within normal limits in parenchymal echogenicity. There is a small amount of ascites surrounding the liver. This was also present on the previous study. Small right pleural effusion was not present previously. IMPRESSION: Stones and sludge in the gallbladder with mild gallbladder wall thickening. This appearance is nonspecific in could be due to stasis or cholecystitis. No focal liver lesions. Small amount of ascites around the liver. Small right pleural effusion. Electronically Signed   By: Lucienne Capers M.D.   On: 04/08/2017 01:10

## 2017-04-08 NOTE — ED Notes (Addendum)
Pt returned from US

## 2017-04-08 NOTE — ED Provider Notes (Signed)
Care signed over from Dr. Joni Jones pending results of ultrasound. Briefly the patient is a 61 year old woman with a complex past medical history including metastatic cancer of unknown etiology who came to the emergency department today for lower extremity swelling. Dr. Joni Jones noted transaminitis and elevated bilirubin which were new's ultrasound of the right upper quadrant was pending. Results of the ultrasound are equivocal but show possible cholecystitis. I discussed the case with on-call surgeon Dr. Dahlia Byes who felt that the patient's clinical picture was less consistent with cholecystitis and more consistent with biliary obstruction. I agree. I discussed the case with the patient, her husband, and her son who is an ER physician's assistant and I offered him transferred back to [facility] for GI evaluation. The patient is able to eat and drink, her pain is under control, and she is afebrile. She declines transferred this time stating she would prefer to follow up with her primary care physician on Monday for reevaluation and states she will return should any new or worsening symptoms develop. I think this is reasonable. She is discharged home in improved and stable condition.   Sylvia Hong, MD 04/08/17 [phone]

## 2017-04-09 ENCOUNTER — Other Ambulatory Visit: Payer: Self-pay

## 2017-04-09 ENCOUNTER — Inpatient Hospital Stay: Payer: 59 | Attending: Internal Medicine

## 2017-04-09 ENCOUNTER — Other Ambulatory Visit: Payer: Self-pay | Admitting: Internal Medicine

## 2017-04-09 ENCOUNTER — Other Ambulatory Visit: Payer: Self-pay | Admitting: *Deleted

## 2017-04-09 ENCOUNTER — Telehealth: Payer: Self-pay | Admitting: Internal Medicine

## 2017-04-09 DIAGNOSIS — C786 Secondary malignant neoplasm of retroperitoneum and peritoneum: Secondary | ICD-10-CM | POA: Diagnosis not present

## 2017-04-09 DIAGNOSIS — M25551 Pain in right hip: Secondary | ICD-10-CM | POA: Diagnosis not present

## 2017-04-09 DIAGNOSIS — E876 Hypokalemia: Secondary | ICD-10-CM | POA: Diagnosis not present

## 2017-04-09 DIAGNOSIS — R918 Other nonspecific abnormal finding of lung field: Secondary | ICD-10-CM | POA: Diagnosis not present

## 2017-04-09 DIAGNOSIS — T182XXA Foreign body in stomach, initial encounter: Secondary | ICD-10-CM | POA: Diagnosis not present

## 2017-04-09 DIAGNOSIS — R300 Dysuria: Secondary | ICD-10-CM | POA: Diagnosis not present

## 2017-04-09 DIAGNOSIS — Z79899 Other long term (current) drug therapy: Secondary | ICD-10-CM | POA: Diagnosis not present

## 2017-04-09 DIAGNOSIS — N131 Hydronephrosis with ureteral stricture, not elsewhere classified: Secondary | ICD-10-CM | POA: Diagnosis not present

## 2017-04-09 DIAGNOSIS — K8689 Other specified diseases of pancreas: Secondary | ICD-10-CM | POA: Diagnosis not present

## 2017-04-09 DIAGNOSIS — C7951 Secondary malignant neoplasm of bone: Secondary | ICD-10-CM | POA: Insufficient documentation

## 2017-04-09 DIAGNOSIS — C801 Malignant (primary) neoplasm, unspecified: Secondary | ICD-10-CM

## 2017-04-09 DIAGNOSIS — C787 Secondary malignant neoplasm of liver and intrahepatic bile duct: Secondary | ICD-10-CM | POA: Diagnosis not present

## 2017-04-09 DIAGNOSIS — D61818 Other pancytopenia: Secondary | ICD-10-CM | POA: Insufficient documentation

## 2017-04-09 DIAGNOSIS — T83511A Infection and inflammatory reaction due to indwelling urethral catheter, initial encounter: Secondary | ICD-10-CM | POA: Diagnosis not present

## 2017-04-09 DIAGNOSIS — Z7189 Other specified counseling: Secondary | ICD-10-CM | POA: Diagnosis not present

## 2017-04-09 DIAGNOSIS — E43 Unspecified severe protein-calorie malnutrition: Secondary | ICD-10-CM | POA: Diagnosis not present

## 2017-04-09 DIAGNOSIS — Z515 Encounter for palliative care: Secondary | ICD-10-CM | POA: Diagnosis not present

## 2017-04-09 DIAGNOSIS — N1330 Unspecified hydronephrosis: Secondary | ICD-10-CM | POA: Diagnosis not present

## 2017-04-09 DIAGNOSIS — R5383 Other fatigue: Secondary | ICD-10-CM | POA: Diagnosis not present

## 2017-04-09 DIAGNOSIS — R933 Abnormal findings on diagnostic imaging of other parts of digestive tract: Secondary | ICD-10-CM | POA: Diagnosis not present

## 2017-04-09 DIAGNOSIS — R188 Other ascites: Secondary | ICD-10-CM | POA: Diagnosis not present

## 2017-04-09 DIAGNOSIS — R109 Unspecified abdominal pain: Secondary | ICD-10-CM | POA: Diagnosis not present

## 2017-04-09 DIAGNOSIS — J90 Pleural effusion, not elsewhere classified: Secondary | ICD-10-CM | POA: Diagnosis not present

## 2017-04-09 DIAGNOSIS — N390 Urinary tract infection, site not specified: Secondary | ICD-10-CM | POA: Diagnosis not present

## 2017-04-09 DIAGNOSIS — M79601 Pain in right arm: Secondary | ICD-10-CM | POA: Diagnosis not present

## 2017-04-09 DIAGNOSIS — R748 Abnormal levels of other serum enzymes: Secondary | ICD-10-CM | POA: Diagnosis not present

## 2017-04-09 DIAGNOSIS — C778 Secondary and unspecified malignant neoplasm of lymph nodes of multiple regions: Secondary | ICD-10-CM | POA: Diagnosis not present

## 2017-04-09 DIAGNOSIS — R935 Abnormal findings on diagnostic imaging of other abdominal regions, including retroperitoneum: Secondary | ICD-10-CM | POA: Diagnosis not present

## 2017-04-09 DIAGNOSIS — C770 Secondary and unspecified malignant neoplasm of lymph nodes of head, face and neck: Secondary | ICD-10-CM | POA: Diagnosis not present

## 2017-04-09 DIAGNOSIS — G893 Neoplasm related pain (acute) (chronic): Secondary | ICD-10-CM | POA: Diagnosis not present

## 2017-04-09 DIAGNOSIS — K3189 Other diseases of stomach and duodenum: Secondary | ICD-10-CM | POA: Diagnosis not present

## 2017-04-09 DIAGNOSIS — C779 Secondary and unspecified malignant neoplasm of lymph node, unspecified: Secondary | ICD-10-CM | POA: Diagnosis not present

## 2017-04-09 DIAGNOSIS — E871 Hypo-osmolality and hyponatremia: Secondary | ICD-10-CM | POA: Diagnosis not present

## 2017-04-09 DIAGNOSIS — R600 Localized edema: Secondary | ICD-10-CM | POA: Diagnosis not present

## 2017-04-09 DIAGNOSIS — Z95828 Presence of other vascular implants and grafts: Secondary | ICD-10-CM

## 2017-04-09 DIAGNOSIS — K831 Obstruction of bile duct: Secondary | ICD-10-CM | POA: Diagnosis not present

## 2017-04-09 DIAGNOSIS — R17 Unspecified jaundice: Secondary | ICD-10-CM | POA: Diagnosis not present

## 2017-04-09 DIAGNOSIS — R5381 Other malaise: Secondary | ICD-10-CM | POA: Diagnosis not present

## 2017-04-09 DIAGNOSIS — R945 Abnormal results of liver function studies: Secondary | ICD-10-CM | POA: Diagnosis not present

## 2017-04-09 LAB — COMPREHENSIVE METABOLIC PANEL
ALT: 95 U/L — ABNORMAL HIGH (ref 14–54)
ANION GAP: 7 (ref 5–15)
AST: 202 U/L — ABNORMAL HIGH (ref 15–41)
Albumin: 2.5 g/dL — ABNORMAL LOW (ref 3.5–5.0)
Alkaline Phosphatase: 1576 U/L — ABNORMAL HIGH (ref 38–126)
BILIRUBIN TOTAL: 6.3 mg/dL — AB (ref 0.3–1.2)
BUN: 9 mg/dL (ref 6–20)
CHLORIDE: 95 mmol/L — AB (ref 101–111)
CO2: 25 mmol/L (ref 22–32)
Calcium: 7.8 mg/dL — ABNORMAL LOW (ref 8.9–10.3)
Creatinine, Ser: 0.67 mg/dL (ref 0.44–1.00)
GFR calc Af Amer: >60 mL/min (ref >60)
GFR calc non Af Amer: >60 mL/min (ref >60)
GLUCOSE: 110 mg/dL — AB (ref 65–99)
POTASSIUM: 4.5 mmol/L (ref 3.5–5.1)
SODIUM: 127 mmol/L — AB (ref 135–145)
TOTAL PROTEIN: 6.4 g/dL — AB (ref 6.5–8.1)

## 2017-04-09 LAB — CBC WITH DIFFERENTIAL/PLATELET
Basophils Absolute: 0 10*3/uL (ref 0–0.1)
Basophils Relative: 0 %
Eosinophils Absolute: 0.1 10*3/uL (ref 0–0.7)
Eosinophils Relative: 1 %
HCT: 30.9 % — ABNORMAL LOW (ref 35.0–47.0)
HEMOGLOBIN: 10.7 g/dL — AB (ref 12.0–16.0)
LYMPHS PCT: 2 %
Lymphs Abs: 0.2 10*3/uL — ABNORMAL LOW (ref 1.0–3.6)
MCH: 31.7 pg (ref 26.0–34.0)
MCHC: 34.7 g/dL (ref 32.0–36.0)
MCV: 91.5 fL (ref 80.0–100.0)
Monocytes Absolute: 0.7 10*3/uL (ref 0.2–0.9)
Monocytes Relative: 9 %
NEUTROS PCT: 88 %
Neutro Abs: 6.9 10*3/uL — ABNORMAL HIGH (ref 1.4–6.5)
Platelets: 282 10*3/uL (ref 150–440)
RBC: 3.38 MIL/uL — ABNORMAL LOW (ref 3.80–5.20)
RDW: 19 % — ABNORMAL HIGH (ref 11.5–14.5)
WBC: 7.9 10*3/uL (ref 3.6–11.0)

## 2017-04-09 MED ORDER — SODIUM CHLORIDE 0.9% FLUSH
10.0000 mL | INTRAVENOUS | Status: AC | PRN
  Filled 2017-04-09: qty 10

## 2017-04-09 MED ORDER — HEPARIN SOD (PORK) LOCK FLUSH 100 UNIT/ML IV SOLN: 500.0000 [IU] | Freq: Once | INTRAVENOUS | Status: AC

## 2017-04-09 NOTE — Telephone Encounter (Signed)
FYI. Met with pt/husband in the lab area spoke to pt's husband/Son discussed re: elevated :LFTs; spoke to [place]; recommend go to [facility] ER for GI work up/ possible ERCP. Pt's husband agreed.

## 2017-04-09 NOTE — Patient Outreach (Signed)
[place] [facility]) Care Management  04/09/2017  Sylvia Jones 1955/12/30 [id number]   Telephone call to member to follow up from ED visit this weekend for lower extremity swelling and elevated liver function tests. Husband answered call and states that they are going out the door to take her back to the hospital and she can not talk at this time. Instructed that RNCM will call back in a few days to attempt to speak with member and get an update on her condition. Plan to call 04/12/17 for follow up. Sylvia Garter RN, [facility] Care Management Coordinator-Link to [place] Management [phone]

## 2017-04-10 ENCOUNTER — Encounter: Payer: Self-pay | Admitting: Internal Medicine

## 2017-04-10 LAB — CANCER ANTIGEN 19-9: CA 19 9: 720 U/mL — AB (ref 0–35)

## 2017-04-10 LAB — CANCER ANTIGEN 27.29: CA 27.29: 251.9 U/mL — AB (ref 0.0–38.6)

## 2017-04-10 LAB — CA 125: CA 125: 705.4 U/mL — ABNORMAL HIGH (ref 0.0–38.1)

## 2017-04-10 LAB — CANCER ANTIGEN 15-3: CAN 15 3: 179.1 U/mL — AB (ref 0.0–25.0)

## 2017-04-11 ENCOUNTER — Encounter: Payer: Self-pay | Admitting: Internal Medicine

## 2017-04-11 ENCOUNTER — Telehealth: Payer: Self-pay | Admitting: Internal Medicine

## 2017-04-11 NOTE — Telephone Encounter (Signed)
FYI-Spoke to patient's husband over the phone. Patient able to have ERCP to relieve the biliary obstruction. Given the significant decline in her clinical condition over the last few weeks; and given the poor response to previous chemotherapies- not good options for treatment available. Significant rise of the tumor markers - suggestive of progressive disease. Recommend comfort care alone at this time. Patient's husband- we'll talk to his son.

## 2017-04-11 NOTE — Telephone Encounter (Signed)
Also, spoke to Dr.Uronis re: pts clinical status; and my recommendations for comfort care. She is in agreement.

## 2017-04-12 ENCOUNTER — Other Ambulatory Visit: Payer: Self-pay

## 2017-04-12 NOTE — Patient Outreach (Signed)
[facility]) Care Management  04/12/2017  Sylvia Jones 06/26/1956 [id number]   Member admitted to [facility] on 04/09/17 with biliary obstruction. Plan for follow up call after member is discharged. Notified Sonda Rumble [facility] of her admission and need for follow up post discharge. Peter Garter RN, [facility] Care Management Coordinator-Link to [place] Management [phone]

## 2017-04-13 ENCOUNTER — Encounter: Payer: Self-pay | Admitting: Internal Medicine

## 2017-04-13 ENCOUNTER — Telehealth: Payer: Self-pay | Admitting: Internal Medicine

## 2017-04-13 NOTE — Telephone Encounter (Signed)
Returned family call; Left message for husband to call me back.

## 2017-04-15 DIAGNOSIS — C801 Malignant (primary) neoplasm, unspecified: Secondary | ICD-10-CM | POA: Diagnosis not present

## 2017-04-15 DIAGNOSIS — C7951 Secondary malignant neoplasm of bone: Secondary | ICD-10-CM | POA: Diagnosis not present

## 2017-04-15 DIAGNOSIS — C779 Secondary and unspecified malignant neoplasm of lymph node, unspecified: Secondary | ICD-10-CM | POA: Diagnosis not present

## 2017-04-15 DIAGNOSIS — E43 Unspecified severe protein-calorie malnutrition: Secondary | ICD-10-CM | POA: Diagnosis not present

## 2017-04-16 DIAGNOSIS — C779 Secondary and unspecified malignant neoplasm of lymph node, unspecified: Secondary | ICD-10-CM | POA: Diagnosis not present

## 2017-04-16 DIAGNOSIS — C7951 Secondary malignant neoplasm of bone: Secondary | ICD-10-CM | POA: Diagnosis not present

## 2017-04-16 DIAGNOSIS — E43 Unspecified severe protein-calorie malnutrition: Secondary | ICD-10-CM | POA: Diagnosis not present

## 2017-04-16 DIAGNOSIS — C801 Malignant (primary) neoplasm, unspecified: Secondary | ICD-10-CM | POA: Diagnosis not present

## 2017-04-17 ENCOUNTER — Other Ambulatory Visit: Payer: Self-pay | Admitting: *Deleted

## 2017-04-17 DIAGNOSIS — C779 Secondary and unspecified malignant neoplasm of lymph node, unspecified: Secondary | ICD-10-CM | POA: Diagnosis not present

## 2017-04-17 DIAGNOSIS — C801 Malignant (primary) neoplasm, unspecified: Secondary | ICD-10-CM | POA: Diagnosis not present

## 2017-04-17 DIAGNOSIS — C7951 Secondary malignant neoplasm of bone: Secondary | ICD-10-CM | POA: Diagnosis not present

## 2017-04-17 DIAGNOSIS — E43 Unspecified severe protein-calorie malnutrition: Secondary | ICD-10-CM | POA: Diagnosis not present

## 2017-04-17 NOTE — Patient Outreach (Addendum)
[facility]) Care Management  04/17/2017  Sylvia Jones 09/11/1956 [id number]  Case discussed on [facility] Case Management conference call on 04/05/17, no follow up at this time recommended.  Received notification on 04/16/17, that patient was re-admitted to hospital, discharged to home with hospice care.  RNCM will proceed with case closure due to patient is ineligible for [facility] Care Management services due to enrollment in other CM program.    Objective: Per chart review, patient hospitalized  04/09/17 - 04/14/17 for right hip pain and discharged to home with hospice care.  Patient also had ED visit on 04/07/17 for leg pain and swelling.   Patient hospitalized 03/14/17 - 03/16/17 at [facility], transferred to [facility] on 03/16/17 - 04/06/17 for small bowel obstruction. Patient has a history of breast cancer, malignant bowel &ureteral obstruction, and metastic carcinoma of unknown origin.    Assessment: Received [facility] Transition of care referral via [place] report on 03/16/17. Transition of care follow up not completed, patient discharged home with hospice care, and will proceed with case closure due to other CM program.   Plan: RNCM will send case closure due to other CM program request to [facility].   Asaf Elmquist H. Annia Friendly, BSN, [place] Management [facility] Telephonic CM Phone: [phone] Fax: [phone]

## 2017-04-18 DIAGNOSIS — C7951 Secondary malignant neoplasm of bone: Secondary | ICD-10-CM | POA: Diagnosis not present

## 2017-04-18 DIAGNOSIS — C801 Malignant (primary) neoplasm, unspecified: Secondary | ICD-10-CM | POA: Diagnosis not present

## 2017-04-18 DIAGNOSIS — C779 Secondary and unspecified malignant neoplasm of lymph node, unspecified: Secondary | ICD-10-CM | POA: Diagnosis not present

## 2017-04-18 DIAGNOSIS — E43 Unspecified severe protein-calorie malnutrition: Secondary | ICD-10-CM | POA: Diagnosis not present

## 2017-04-19 DIAGNOSIS — C779 Secondary and unspecified malignant neoplasm of lymph node, unspecified: Secondary | ICD-10-CM | POA: Diagnosis not present

## 2017-04-19 DIAGNOSIS — C7951 Secondary malignant neoplasm of bone: Secondary | ICD-10-CM | POA: Diagnosis not present

## 2017-04-19 DIAGNOSIS — E43 Unspecified severe protein-calorie malnutrition: Secondary | ICD-10-CM | POA: Diagnosis not present

## 2017-04-19 DIAGNOSIS — C801 Malignant (primary) neoplasm, unspecified: Secondary | ICD-10-CM | POA: Diagnosis not present

## 2017-04-20 DIAGNOSIS — E43 Unspecified severe protein-calorie malnutrition: Secondary | ICD-10-CM | POA: Diagnosis not present

## 2017-04-20 DIAGNOSIS — C779 Secondary and unspecified malignant neoplasm of lymph node, unspecified: Secondary | ICD-10-CM | POA: Diagnosis not present

## 2017-04-20 DIAGNOSIS — C801 Malignant (primary) neoplasm, unspecified: Secondary | ICD-10-CM | POA: Diagnosis not present

## 2017-04-20 DIAGNOSIS — C7951 Secondary malignant neoplasm of bone: Secondary | ICD-10-CM | POA: Diagnosis not present

## 2017-04-27 DEATH — deceased

## 2017-06-02 ENCOUNTER — Other Ambulatory Visit: Payer: Self-pay | Admitting: Nurse Practitioner

## 2018-01-02 IMAGING — CT NM PET TUM IMG INITIAL (PI) SKULL BASE T - THIGH
1 of 10 series · 1 of 25 positions shown · non-contrast
Comparison: CT eat of the chest status pelvis from 08/07/2016.

CLINICAL DATA: Initial treatment strategy for primary cancer of
unknown site..

EXAM:
NUCLEAR MEDICINE PET SKULL BASE TO THIGH
TECHNIQUE: 13.28 mCi F-18 FDG was injected intravenously. Full-ring PET imaging
was performed from the skull base to thigh after the radiotracer. CT
data was obtained and used for attenuation correction and anatomic
localization.
FASTING BLOOD GLUCOSE:  Value: 88 mg/dl

[Series 3: ct wb 5.0 b30f · axial · 5.0mm · 0.98mm/px · 1 of 290 slices shown]
[im 290/290  brain]
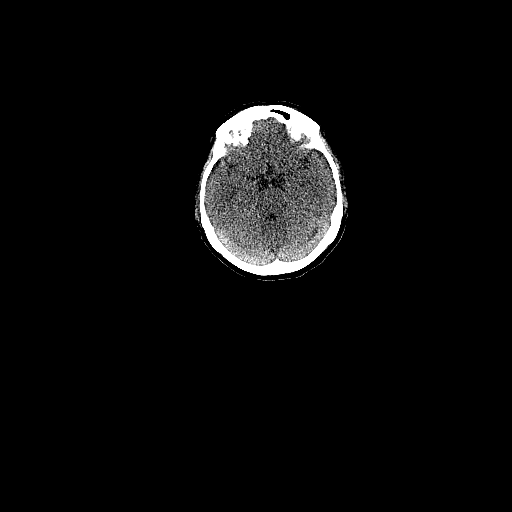

[1 of 25 positions shown; findings below may reference images not displayed]

FINDINGS: NECK

Hypermetabolic bilateral cervical lymph nodes are identified. Index
left level 2 lymph node measures 8 mm and has an SUV max equal to
8.8. Right level 2 lymph measures 7 mm and has an SUV max equal to
7.7, image 24 of series 3.

CHEST

No hypermetabolic mediastinal or hilar nodes. No suspicious
pulmonary nodules on the CT scan. Increased radiotracer uptake
within the distal esophagus noted. SUV max equals 4.9. Calcified
granuloma is identified within the right lower lobe. There is a
small left pleural effusion noted. No hypermetabolic pulmonary
nodules identified

ABDOMEN/PELVIS

No abnormal hypermetabolic activity within the liver, pancreas,
adrenal glands, or spleen. Extensive hypermetabolic upper abdominal
adenopathy identified. Index gastrohepatic ligament node measures 1
cm and has an SUV max equal to 4.8. Portahepatic node measures
cm and has an SUV max equal to 12.5. Hypermetabolic periaortic node
measures 1 cm and has an SUV max equal to 9.14. Numerous
hypermetabolic mesenteric nodes identified. Index node with in the
jejunal mesenteric measures 1 cm and has an SUV max equal to 6.6.
Hypermetabolic left common iliac and left external iliac nodes
identified. Left external iliac node measures 1.5 cm and has an SUV
max equal to 9.1.

SKELETON

Multifocal hypermetabolic lytic bone metastases are identified.
Index lytic lesion involving the posterior elements of C7 has an SUV
max equal to 7.5, image 45 of series 3. Hypermetabolic lytic lesion
involving the posterior aspect of the left first rib has an SUV max
equal to 6.28, image 52 of series 3. Lytic lesion involving the T3
vertebra has an SUV max equal to 5.17. Destructive bone lesion
involving the left pubic bone with associated pathologic fracture is
identified, image 232 of series 3. SUV max is equal to 10.9.
IMPRESSION: 1. Examination is positive for hypermetabolic metastatic adenopathy
within the neck, abdomen and pelvis.
2. Multifocal hypermetabolic lytic bone metastases including lesion
involving the T3 vertebra.
3. Nonspecific increased radiotracer uptake localizes to the distal
aspect of the esophagus.
